# Patient Record
Sex: Male | Born: 1980
Health system: Southern US, Community
[De-identification: ages and names within clinical notes are randomized; demographics above are authoritative.]

## PROBLEM LIST (undated history)

## (undated) ENCOUNTER — Inpatient Hospital Stay: Payer: Self-pay | Admitting: Internal Medicine

## (undated) ENCOUNTER — Emergency Department (HOSPITAL_COMMUNITY): Admission: EM | Payer: BLUE CROSS/BLUE SHIELD | Source: Home / Self Care

## (undated) DIAGNOSIS — F329 Major depressive disorder, single episode, unspecified: Secondary | ICD-10-CM

## (undated) DIAGNOSIS — I878 Other specified disorders of veins: Secondary | ICD-10-CM

## (undated) DIAGNOSIS — I219 Acute myocardial infarction, unspecified: Secondary | ICD-10-CM

## (undated) DIAGNOSIS — I2699 Other pulmonary embolism without acute cor pulmonale: Secondary | ICD-10-CM

## (undated) DIAGNOSIS — I82409 Acute embolism and thrombosis of unspecified deep veins of unspecified lower extremity: Secondary | ICD-10-CM

## (undated) DIAGNOSIS — F32A Depression, unspecified: Secondary | ICD-10-CM

## (undated) DIAGNOSIS — F419 Anxiety disorder, unspecified: Secondary | ICD-10-CM

## (undated) DIAGNOSIS — K219 Gastro-esophageal reflux disease without esophagitis: Secondary | ICD-10-CM

## (undated) DIAGNOSIS — J189 Pneumonia, unspecified organism: Secondary | ICD-10-CM

## (undated) DIAGNOSIS — D6851 Activated protein C resistance: Secondary | ICD-10-CM

## (undated) HISTORY — PX: TONSILLECTOMY: SUR1361

## (undated) HISTORY — DX: Anxiety disorder, unspecified: F41.9

## (undated) HISTORY — DX: Depression, unspecified: F32.A

## (undated) HISTORY — DX: Major depressive disorder, single episode, unspecified: F32.9

## (undated) HISTORY — DX: Activated protein C resistance: D68.51

## (undated) HISTORY — DX: Other specified disorders of veins: I87.8

---

## 1998-11-02 ENCOUNTER — Encounter: Payer: Self-pay | Admitting: Family Medicine

## 1998-11-02 ENCOUNTER — Ambulatory Visit (HOSPITAL_COMMUNITY): Admission: RE | Admit: 1998-11-02 | Discharge: 1998-11-02 | Payer: Self-pay | Admitting: Family Medicine

## 2004-07-16 ENCOUNTER — Emergency Department (HOSPITAL_COMMUNITY): Admission: EM | Admit: 2004-07-16 | Discharge: 2004-07-16 | Payer: Self-pay | Admitting: Emergency Medicine

## 2004-07-17 ENCOUNTER — Ambulatory Visit (HOSPITAL_COMMUNITY): Admission: RE | Admit: 2004-07-17 | Discharge: 2004-07-17 | Payer: Self-pay | Admitting: Emergency Medicine

## 2004-07-17 ENCOUNTER — Emergency Department (HOSPITAL_COMMUNITY): Admission: EM | Admit: 2004-07-17 | Discharge: 2004-07-17 | Payer: Self-pay | Admitting: Emergency Medicine

## 2006-03-29 ENCOUNTER — Emergency Department (HOSPITAL_COMMUNITY): Admission: EM | Admit: 2006-03-29 | Discharge: 2006-03-29 | Payer: Self-pay | Admitting: Emergency Medicine

## 2006-03-29 ENCOUNTER — Encounter: Payer: Self-pay | Admitting: Vascular Surgery

## 2006-04-15 ENCOUNTER — Emergency Department (HOSPITAL_COMMUNITY): Admission: EM | Admit: 2006-04-15 | Discharge: 2006-04-15 | Payer: Self-pay | Admitting: Emergency Medicine

## 2006-05-05 ENCOUNTER — Inpatient Hospital Stay (HOSPITAL_COMMUNITY): Admission: EM | Admit: 2006-05-05 | Discharge: 2006-05-09 | Payer: Self-pay | Admitting: Emergency Medicine

## 2006-05-05 ENCOUNTER — Encounter: Payer: Self-pay | Admitting: Vascular Surgery

## 2007-06-22 ENCOUNTER — Emergency Department (HOSPITAL_COMMUNITY): Admission: EM | Admit: 2007-06-22 | Discharge: 2007-06-22 | Payer: Self-pay | Admitting: Emergency Medicine

## 2007-07-11 ENCOUNTER — Emergency Department (HOSPITAL_COMMUNITY): Admission: EM | Admit: 2007-07-11 | Discharge: 2007-07-12 | Payer: Self-pay | Admitting: Emergency Medicine

## 2007-11-06 ENCOUNTER — Ambulatory Visit: Payer: Self-pay | Admitting: Surgery

## 2007-11-06 ENCOUNTER — Encounter (INDEPENDENT_AMBULATORY_CARE_PROVIDER_SITE_OTHER): Payer: Self-pay | Admitting: Emergency Medicine

## 2007-11-06 ENCOUNTER — Emergency Department (HOSPITAL_COMMUNITY): Admission: EM | Admit: 2007-11-06 | Discharge: 2007-11-06 | Payer: Self-pay | Admitting: Emergency Medicine

## 2007-11-12 ENCOUNTER — Inpatient Hospital Stay (HOSPITAL_COMMUNITY): Admission: EM | Admit: 2007-11-12 | Discharge: 2007-11-16 | Payer: Self-pay | Admitting: Emergency Medicine

## 2007-11-13 ENCOUNTER — Encounter (INDEPENDENT_AMBULATORY_CARE_PROVIDER_SITE_OTHER): Payer: Self-pay | Admitting: Internal Medicine

## 2007-11-17 ENCOUNTER — Ambulatory Visit: Payer: Self-pay | Admitting: Internal Medicine

## 2007-11-17 ENCOUNTER — Inpatient Hospital Stay (HOSPITAL_COMMUNITY): Admission: EM | Admit: 2007-11-17 | Discharge: 2007-12-10 | Payer: Self-pay | Admitting: Emergency Medicine

## 2007-11-17 ENCOUNTER — Ambulatory Visit: Payer: Self-pay | Admitting: Pulmonary Disease

## 2007-11-17 HISTORY — PX: OTHER SURGICAL HISTORY: SHX169

## 2007-11-19 ENCOUNTER — Ambulatory Visit: Payer: Self-pay | Admitting: Cardiothoracic Surgery

## 2007-11-21 ENCOUNTER — Encounter: Payer: Self-pay | Admitting: Internal Medicine

## 2007-12-03 ENCOUNTER — Ambulatory Visit: Payer: Self-pay | Admitting: Oncology

## 2007-12-03 ENCOUNTER — Encounter: Payer: Self-pay | Admitting: Internal Medicine

## 2007-12-08 ENCOUNTER — Ambulatory Visit: Payer: Self-pay | Admitting: Oncology

## 2007-12-13 ENCOUNTER — Ambulatory Visit: Payer: Self-pay | Admitting: Internal Medicine

## 2007-12-13 DIAGNOSIS — I82409 Acute embolism and thrombosis of unspecified deep veins of unspecified lower extremity: Secondary | ICD-10-CM

## 2007-12-13 LAB — CONVERTED CEMR LAB: INR: 3.4

## 2007-12-14 ENCOUNTER — Encounter (INDEPENDENT_AMBULATORY_CARE_PROVIDER_SITE_OTHER): Payer: Self-pay | Admitting: Pharmacist

## 2007-12-18 ENCOUNTER — Ambulatory Visit: Payer: Self-pay | Admitting: Internal Medicine

## 2007-12-18 DIAGNOSIS — D6859 Other primary thrombophilia: Secondary | ICD-10-CM | POA: Insufficient documentation

## 2007-12-20 ENCOUNTER — Encounter (INDEPENDENT_AMBULATORY_CARE_PROVIDER_SITE_OTHER): Payer: Self-pay | Admitting: Internal Medicine

## 2007-12-22 ENCOUNTER — Emergency Department (HOSPITAL_COMMUNITY): Admission: EM | Admit: 2007-12-22 | Discharge: 2007-12-22 | Payer: Self-pay | Admitting: Emergency Medicine

## 2007-12-25 ENCOUNTER — Telehealth (INDEPENDENT_AMBULATORY_CARE_PROVIDER_SITE_OTHER): Payer: Self-pay | Admitting: Pharmacist

## 2007-12-26 ENCOUNTER — Ambulatory Visit: Payer: Self-pay | Admitting: *Deleted

## 2007-12-26 LAB — CONVERTED CEMR LAB

## 2008-01-02 ENCOUNTER — Ambulatory Visit: Payer: Self-pay | Admitting: Internal Medicine

## 2008-01-02 LAB — CONVERTED CEMR LAB: INR: 2

## 2008-01-14 ENCOUNTER — Ambulatory Visit: Payer: Self-pay | Admitting: Internal Medicine

## 2008-01-14 LAB — CONVERTED CEMR LAB

## 2008-01-28 ENCOUNTER — Ambulatory Visit: Payer: Self-pay | Admitting: Internal Medicine

## 2008-01-28 LAB — CONVERTED CEMR LAB: INR: 2.1

## 2008-02-11 ENCOUNTER — Ambulatory Visit: Payer: Self-pay | Admitting: Internal Medicine

## 2008-03-03 ENCOUNTER — Ambulatory Visit: Payer: Self-pay | Admitting: *Deleted

## 2008-03-03 LAB — CONVERTED CEMR LAB: INR: 1.5

## 2008-03-24 ENCOUNTER — Ambulatory Visit: Payer: Self-pay | Admitting: Internal Medicine

## 2008-03-24 LAB — CONVERTED CEMR LAB: INR: 1.8

## 2008-06-12 ENCOUNTER — Ambulatory Visit: Payer: Self-pay | Admitting: Internal Medicine

## 2008-06-12 LAB — CONVERTED CEMR LAB: INR: 1.3

## 2008-07-08 ENCOUNTER — Encounter: Payer: Self-pay | Admitting: Internal Medicine

## 2008-07-08 ENCOUNTER — Ambulatory Visit: Payer: Self-pay | Admitting: *Deleted

## 2008-08-04 ENCOUNTER — Ambulatory Visit: Payer: Self-pay | Admitting: Internal Medicine

## 2008-08-04 LAB — CONVERTED CEMR LAB

## 2008-10-13 ENCOUNTER — Ambulatory Visit: Payer: Self-pay | Admitting: Internal Medicine

## 2008-10-13 LAB — CONVERTED CEMR LAB: INR: 1.1

## 2008-10-28 ENCOUNTER — Ambulatory Visit: Payer: Self-pay | Admitting: Infectious Diseases

## 2008-10-28 DIAGNOSIS — I872 Venous insufficiency (chronic) (peripheral): Secondary | ICD-10-CM | POA: Insufficient documentation

## 2008-11-03 ENCOUNTER — Ambulatory Visit: Payer: Self-pay | Admitting: Internal Medicine

## 2008-11-03 ENCOUNTER — Ambulatory Visit: Payer: Self-pay | Admitting: Infectious Diseases

## 2008-11-03 LAB — CONVERTED CEMR LAB: INR: 2.3

## 2008-11-05 ENCOUNTER — Encounter (HOSPITAL_BASED_OUTPATIENT_CLINIC_OR_DEPARTMENT_OTHER): Admission: RE | Admit: 2008-11-05 | Discharge: 2009-02-03 | Payer: Self-pay | Admitting: Internal Medicine

## 2008-12-23 ENCOUNTER — Emergency Department (HOSPITAL_COMMUNITY): Admission: EM | Admit: 2008-12-23 | Discharge: 2008-12-23 | Payer: Self-pay | Admitting: Emergency Medicine

## 2008-12-24 ENCOUNTER — Encounter: Payer: Self-pay | Admitting: Internal Medicine

## 2008-12-31 ENCOUNTER — Ambulatory Visit: Payer: Self-pay | Admitting: Infectious Diseases

## 2008-12-31 DIAGNOSIS — L0390 Cellulitis, unspecified: Secondary | ICD-10-CM

## 2008-12-31 DIAGNOSIS — L0291 Cutaneous abscess, unspecified: Secondary | ICD-10-CM

## 2008-12-31 LAB — CONVERTED CEMR LAB
Hemoglobin: 15.3 g/dL (ref 13.0–17.0)
INR: 2.6 — ABNORMAL HIGH (ref 0.0–1.5)
Lymphocytes Relative: 20 % (ref 12–46)
Lymphs Abs: 1.5 10*3/uL (ref 0.7–4.0)
Monocytes Absolute: 0.7 10*3/uL (ref 0.1–1.0)
Monocytes Relative: 10 % (ref 3–12)
Neutro Abs: 4.8 10*3/uL (ref 1.7–7.7)
Neutrophils Relative %: 64 % (ref 43–77)
Prothrombin Time: 27.7 s — ABNORMAL HIGH (ref 11.6–15.2)
RBC: 4.71 M/uL (ref 4.22–5.81)
WBC: 7.5 10*3/uL (ref 4.0–10.5)

## 2009-05-13 IMAGING — CR DG CHEST 2V
2 series · 2 of 2 positions shown · non-contrast
Comparison: 11/26/2007

CLINICAL DATA: Right hemithorax hematoma.

CHEST - 2 VIEW

[w chest pa]
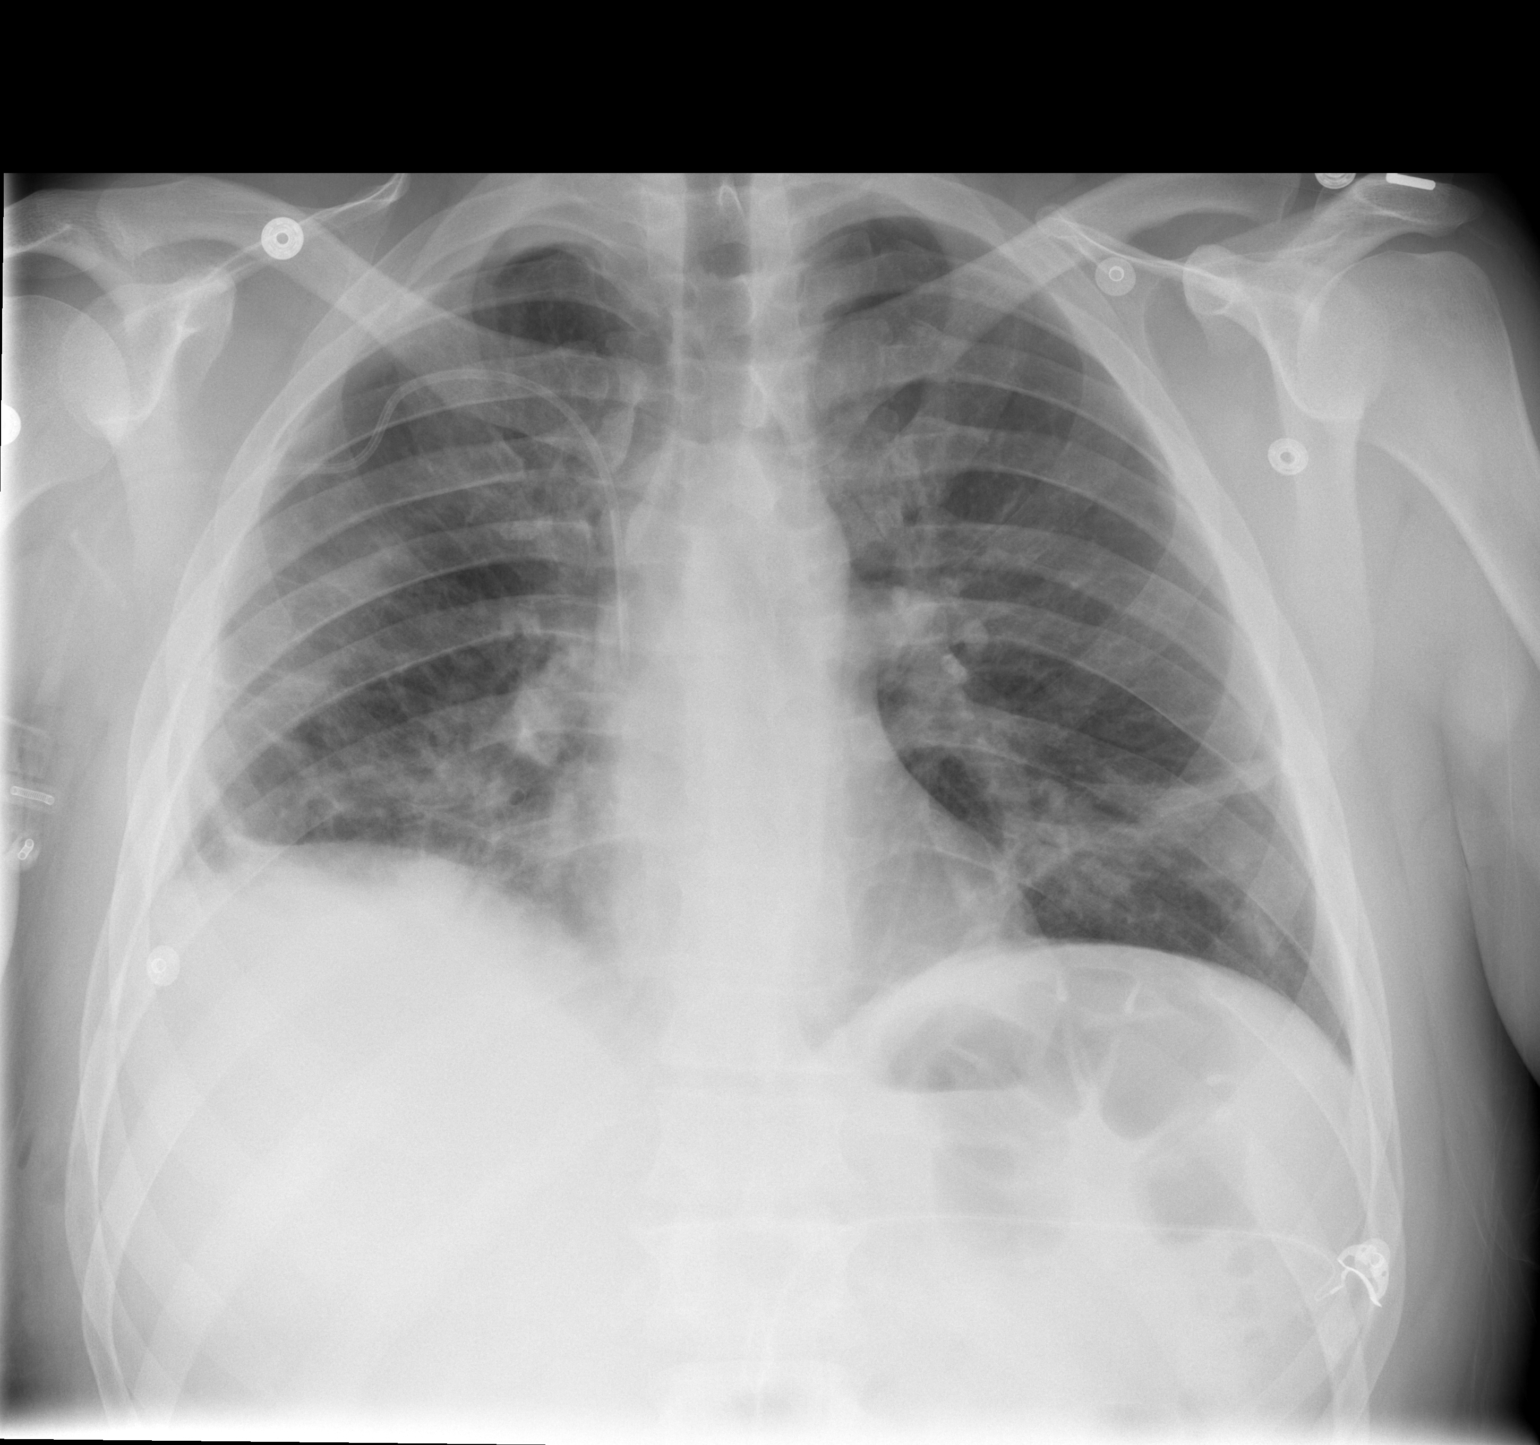

[w chest lat]
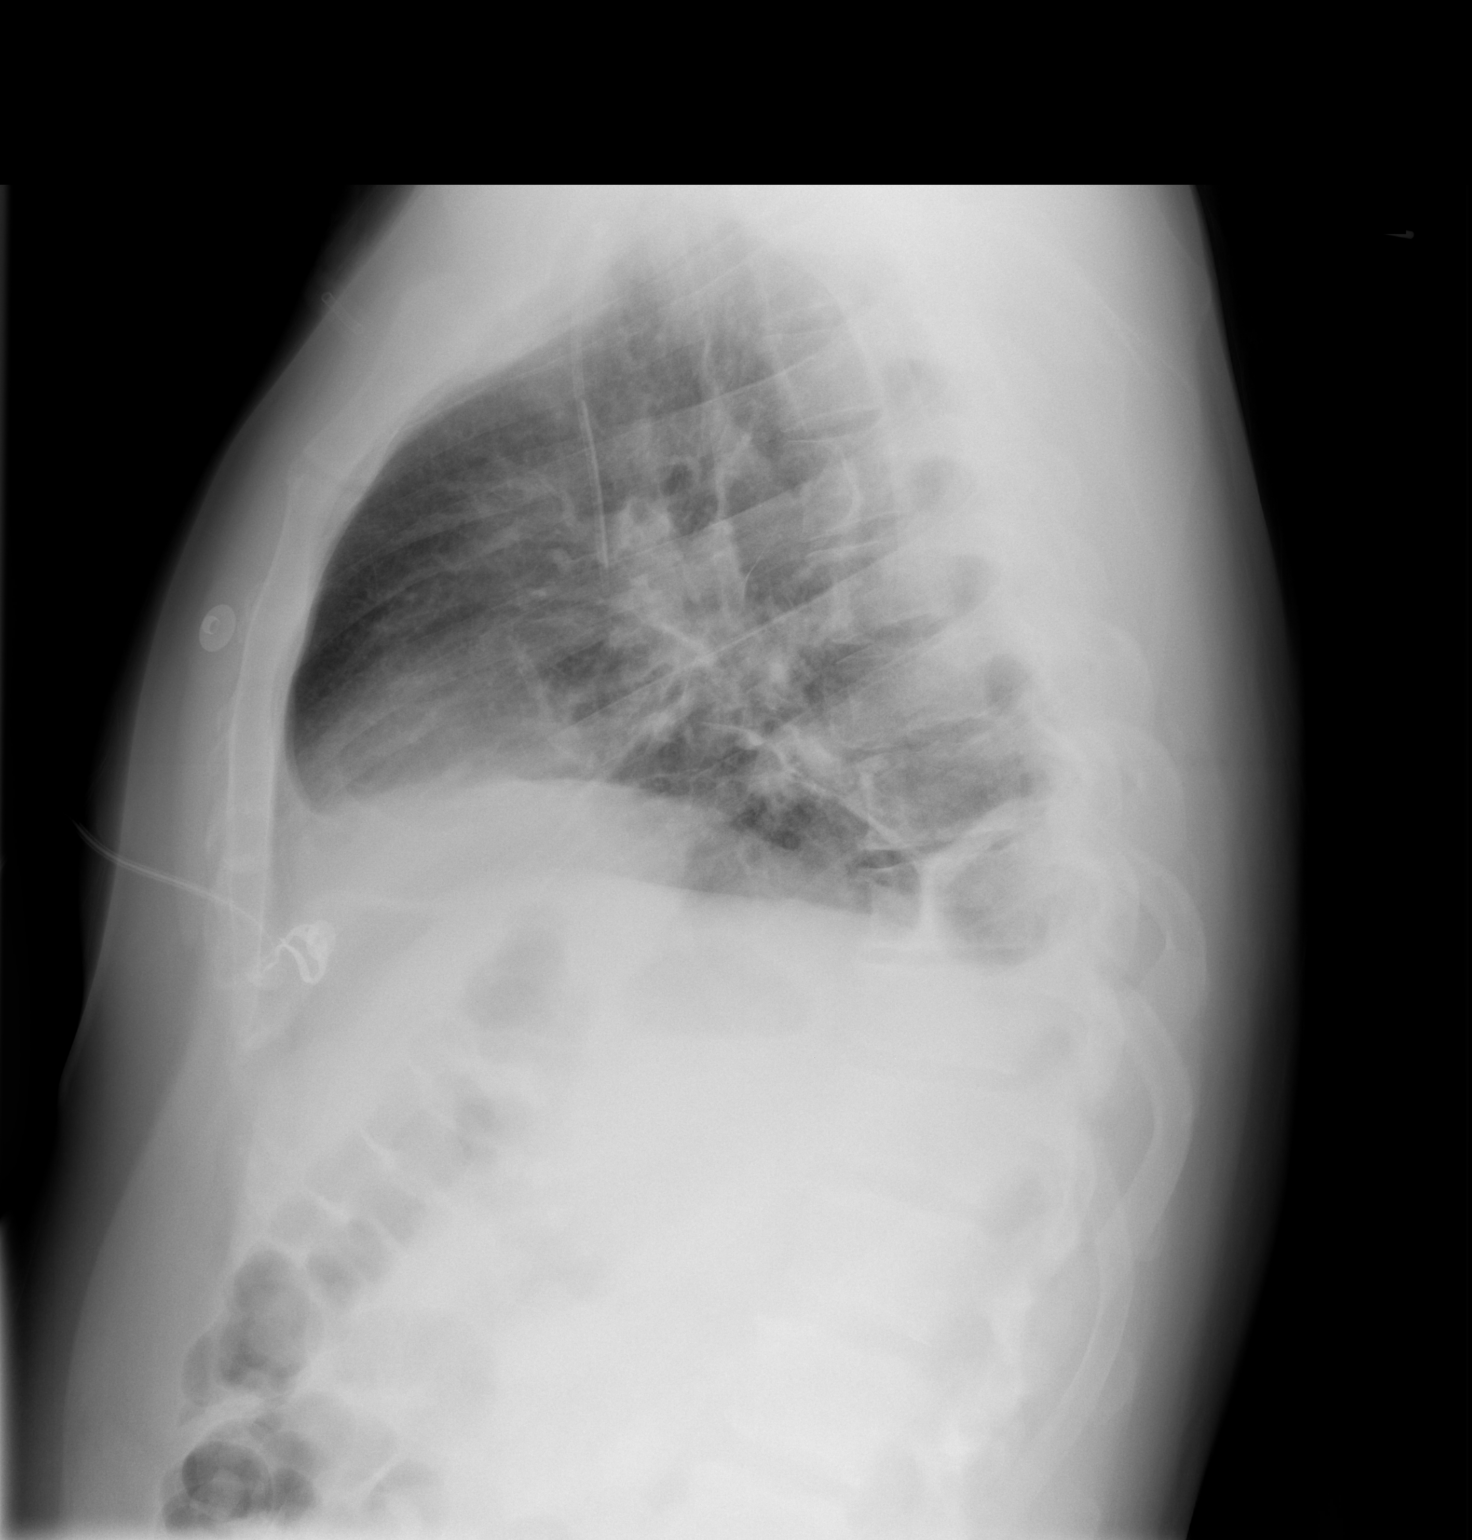

[2 of 2 positions shown; findings below may reference images not displayed]

FINDINGS: Two views of the chest were obtained.  The right chest
tube has been removed.  This is volume loss in the right lung base
with pleural and parenchymal densities.  The patient has a right
subclavian central venous catheter with the tip in the SVC.
Persistent linear density in the left lower lung suggestive for
atelectasis. There is no significant pneumothorax.
IMPRESSION: Removal of right chest tube without a large pneumothorax.

Stable pleural and parenchymal densities in the right lung.  There
is no significant pleural fluid.

Stable linear densities in both lungs probably representing
atelectasis.

## 2009-05-14 IMAGING — CR DG CHEST 2V
2 series · 2 of 2 positions shown · non-contrast
Comparison: PA and lateral chest 11/27/2007.

CLINICAL DATA: Pneumonia.  Status post thoracotomy.

CHEST - 2 VIEW

[w chest pa]
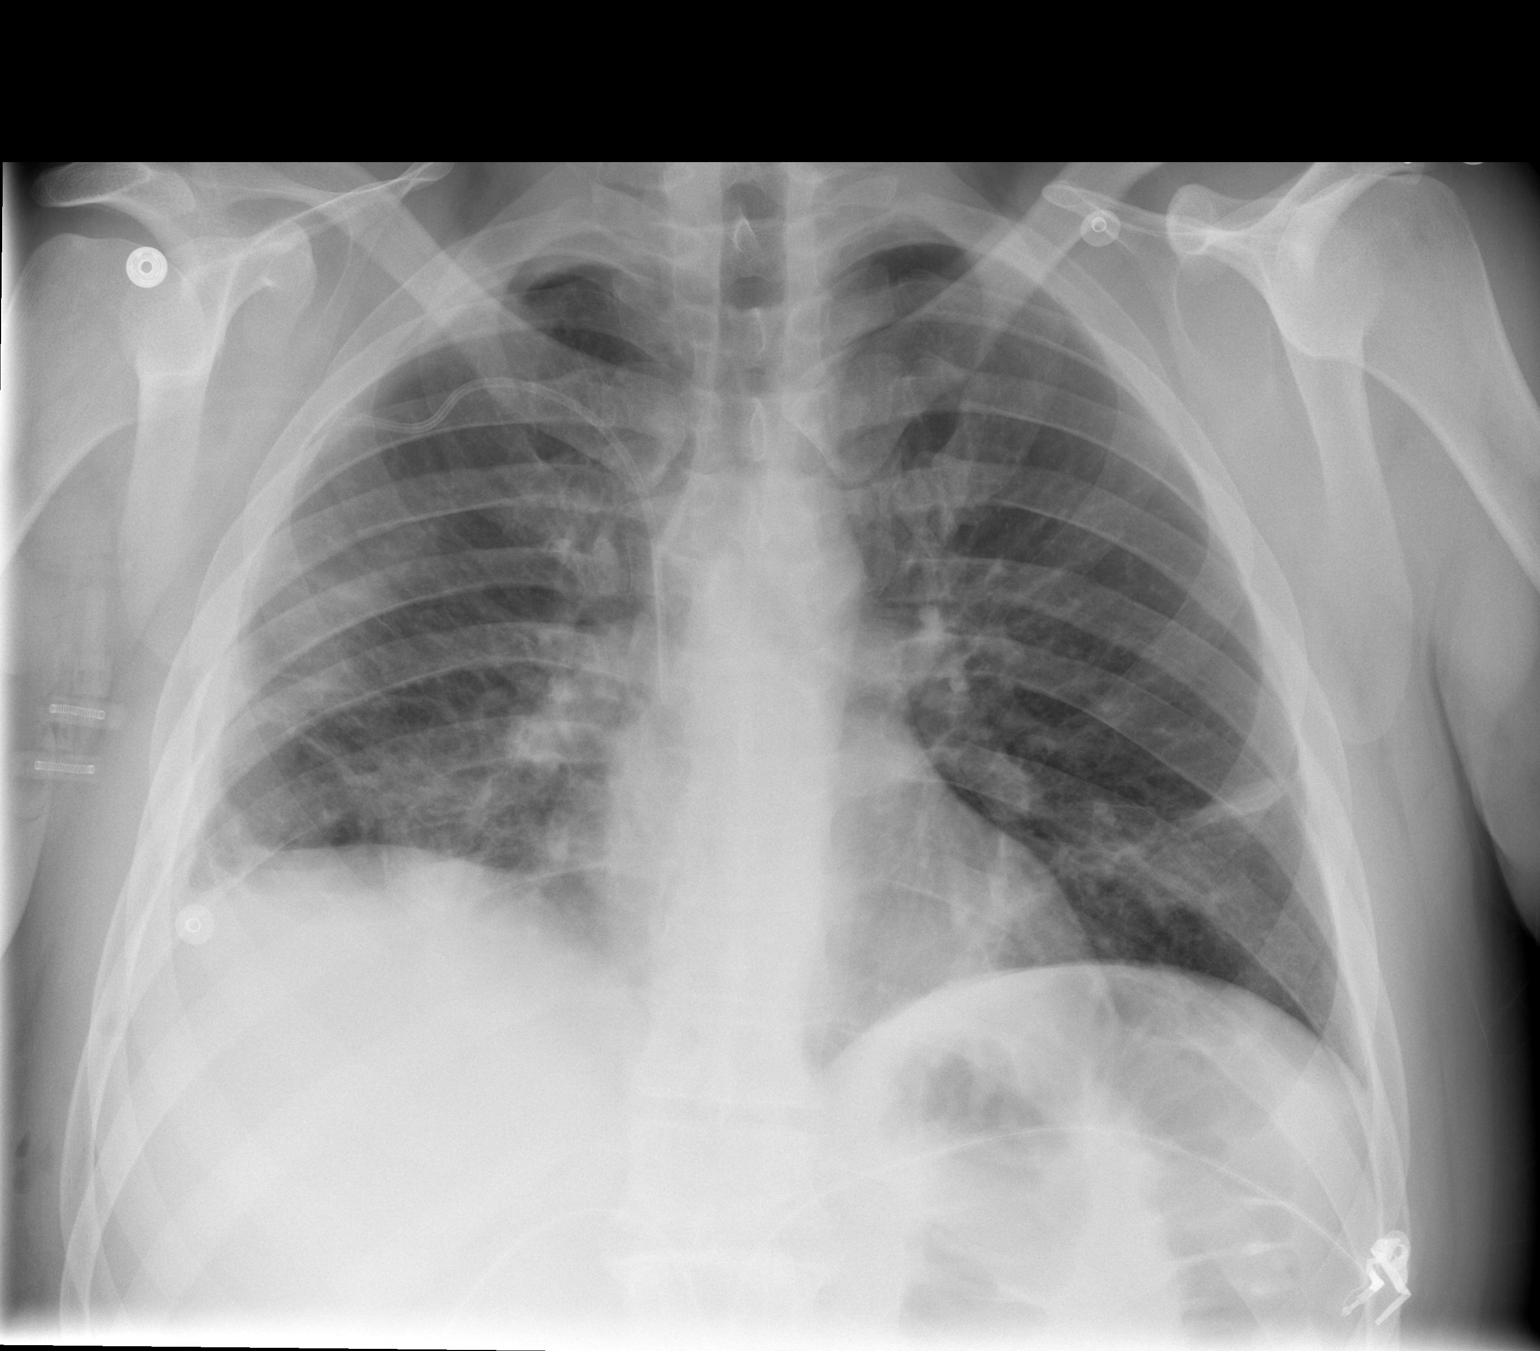

[w chest lat]
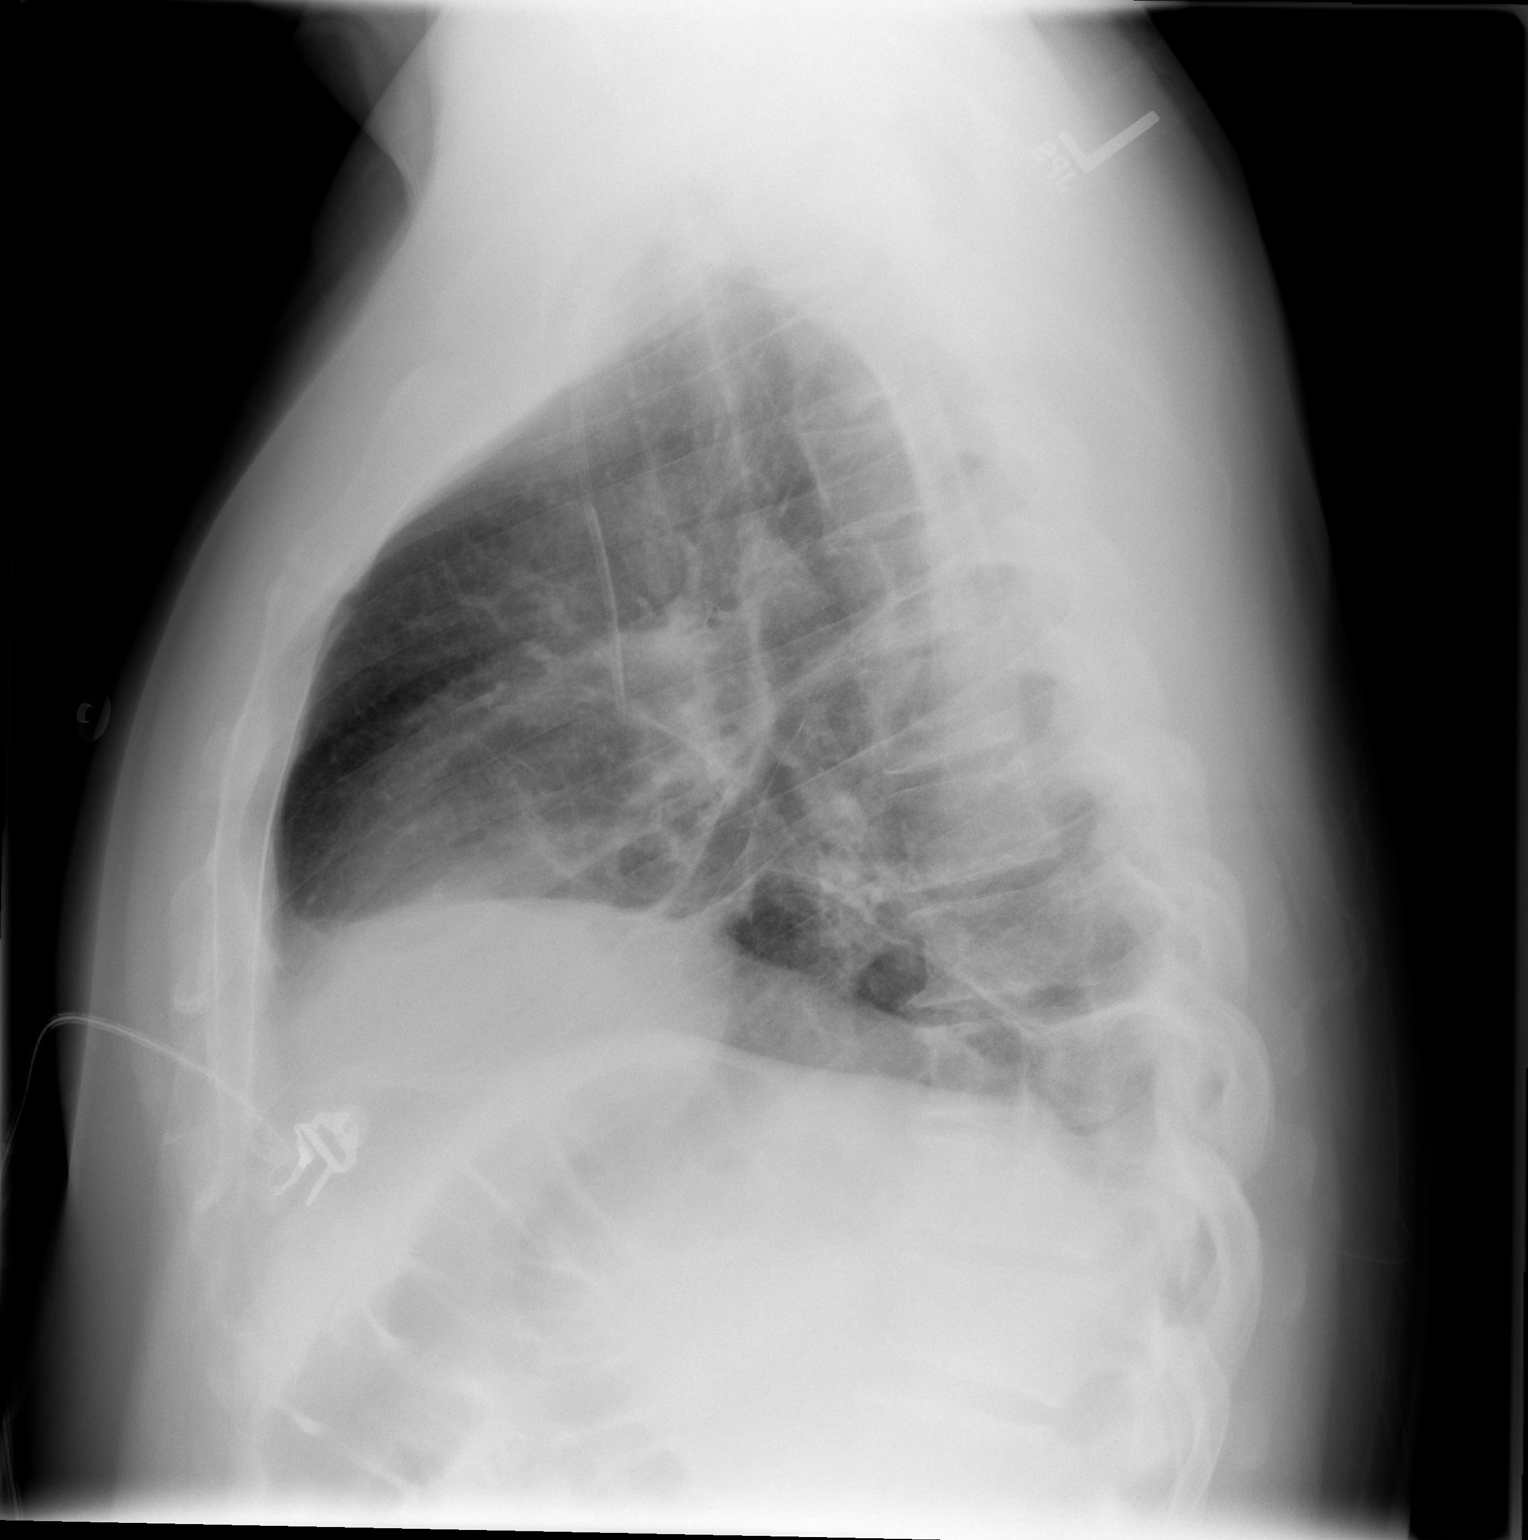

[2 of 2 positions shown; findings below may reference images not displayed]

FINDINGS: Small right pleural effusion or pleural thickening is
unchanged.  Bilateral atelectatic change, greater the right lung
again noted.  There is some volume loss the right chest.  Heart
size normal.  No pneumothorax.
IMPRESSION: No interval change.

## 2009-05-16 IMAGING — CR DG CHEST 2V
2 series · 2 of 2 positions shown · non-contrast
Comparison: Chest x-ray of 11/29/2007

CLINICAL DATA: Right-sided chest pain, history of bilateral
pulmonary embolism

CHEST - 2 VIEW

[w chest pa]
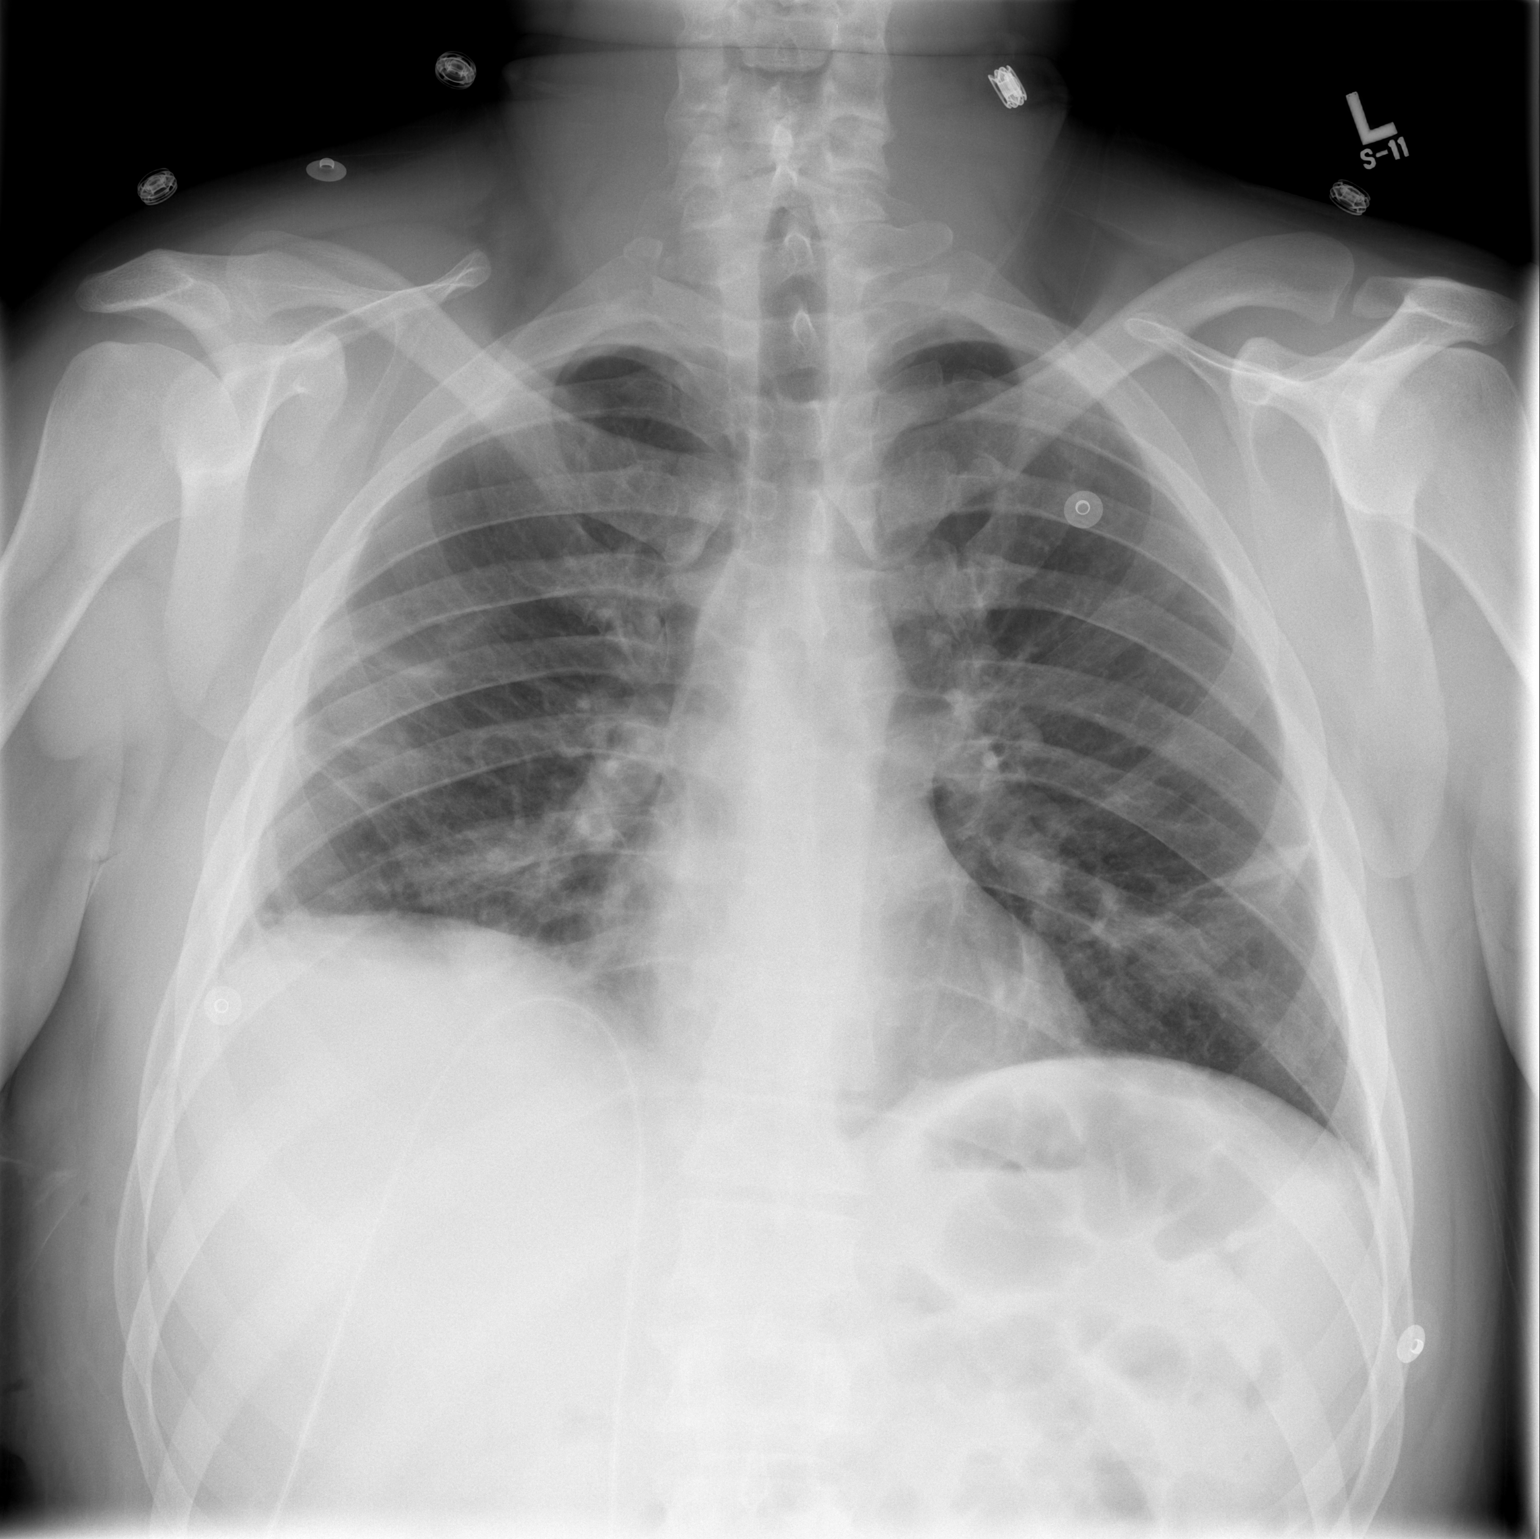

[w chest lat]
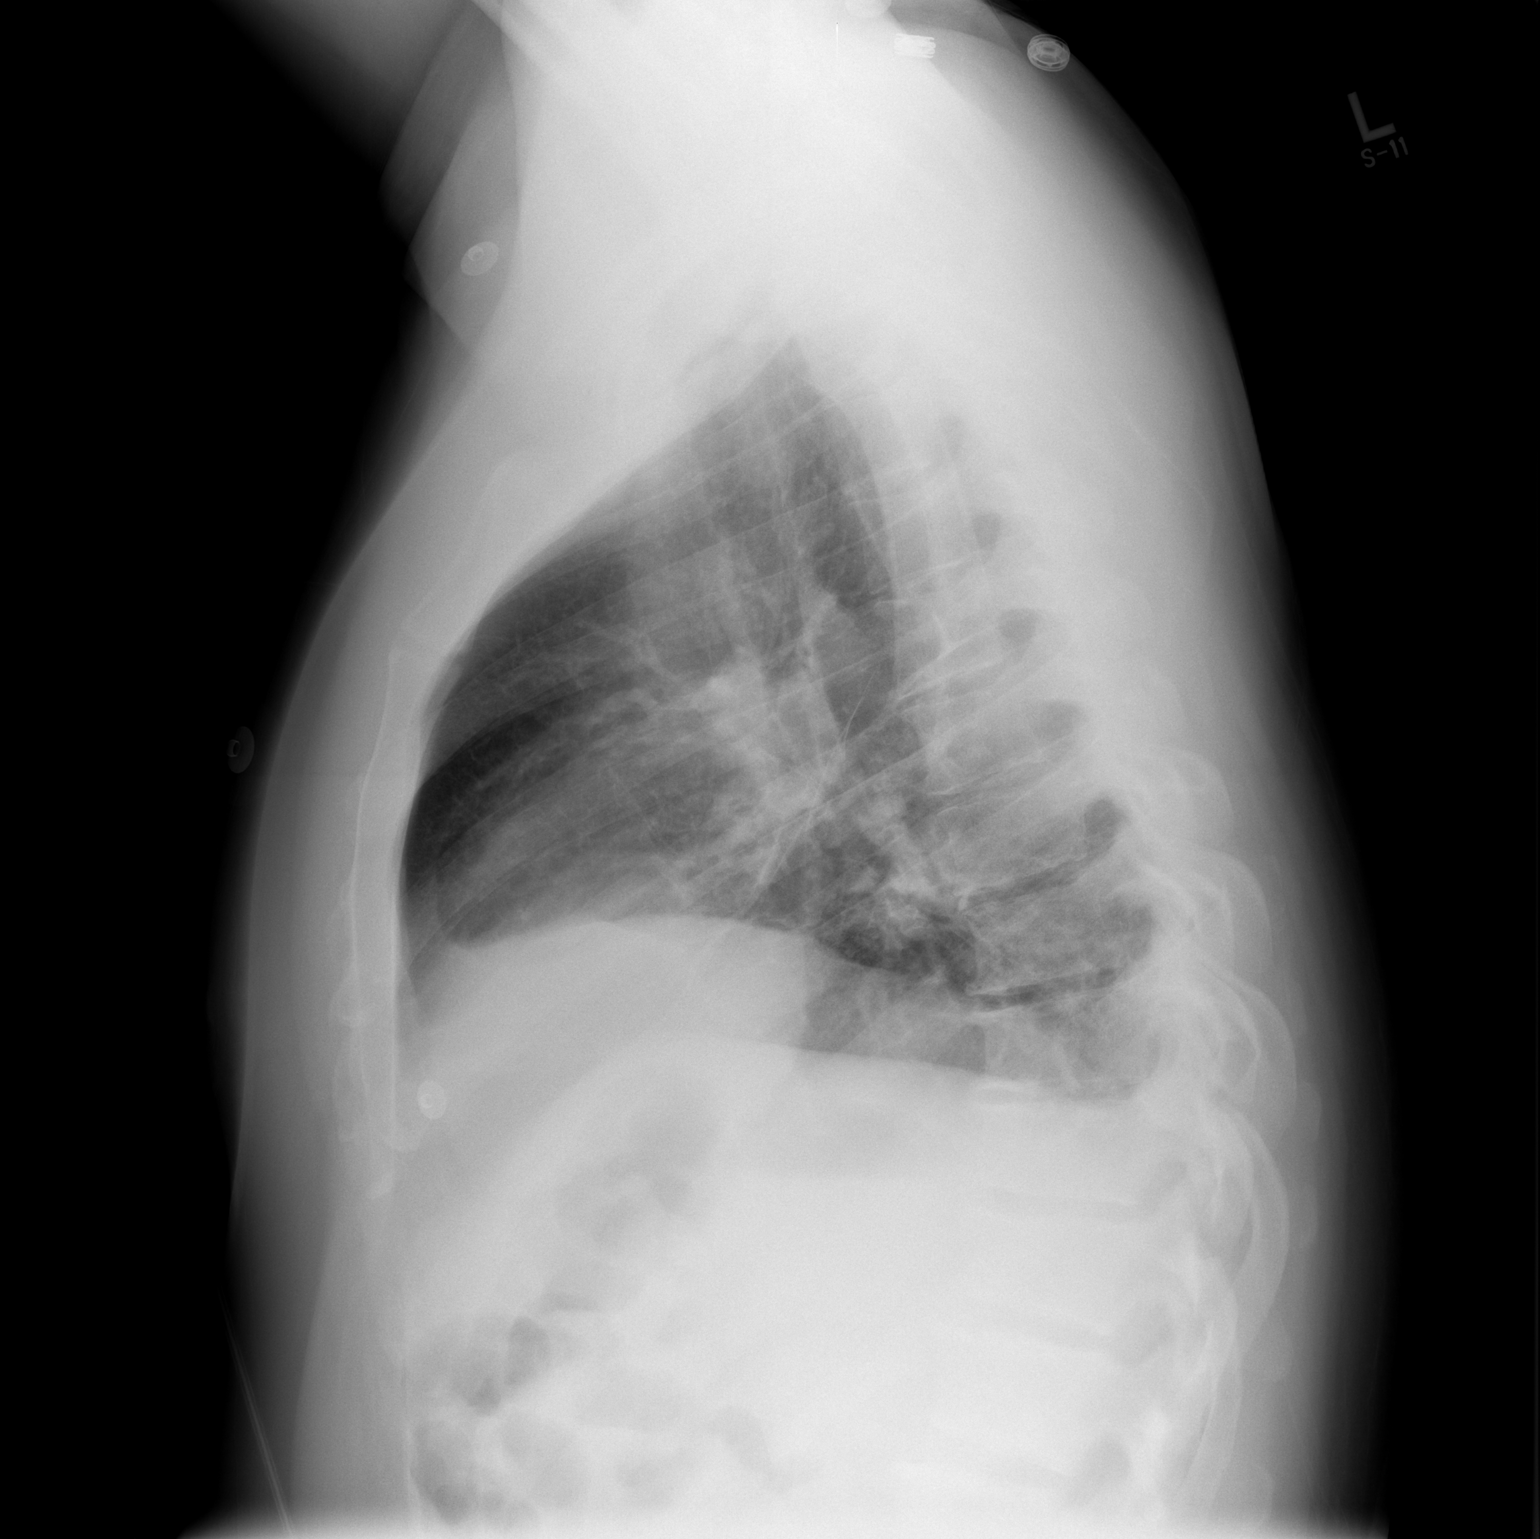

[2 of 2 positions shown; findings below may reference images not displayed]

FINDINGS: There is mild persistent linear atelectasis at the bases
with small effusions blunting the posterior costophrenic angles.
Aeration is suboptimal.  Heart size is stable.
IMPRESSION: Basilar linear atelectasis with tiny effusions.  Suboptimal
aeration.

## 2009-11-16 HISTORY — PX: OTHER SURGICAL HISTORY: SHX169

## 2009-11-17 ENCOUNTER — Inpatient Hospital Stay (HOSPITAL_COMMUNITY): Admission: EM | Admit: 2009-11-17 | Discharge: 2009-11-22 | Payer: Self-pay | Admitting: Emergency Medicine

## 2009-11-17 ENCOUNTER — Ambulatory Visit: Payer: Self-pay | Admitting: Thoracic Surgery (Cardiothoracic Vascular Surgery)

## 2009-11-17 DIAGNOSIS — J939 Pneumothorax, unspecified: Secondary | ICD-10-CM | POA: Insufficient documentation

## 2009-11-17 DIAGNOSIS — J930 Spontaneous tension pneumothorax: Secondary | ICD-10-CM

## 2009-11-24 ENCOUNTER — Ambulatory Visit: Payer: Self-pay | Admitting: Internal Medicine

## 2009-11-24 DIAGNOSIS — F341 Dysthymic disorder: Secondary | ICD-10-CM

## 2009-11-25 ENCOUNTER — Encounter: Payer: Self-pay | Admitting: Internal Medicine

## 2009-11-25 LAB — CONVERTED CEMR LAB: INR: 1.18 (ref <1.50)

## 2009-11-26 ENCOUNTER — Telehealth: Payer: Self-pay | Admitting: Licensed Clinical Social Worker

## 2009-11-27 ENCOUNTER — Ambulatory Visit: Payer: Self-pay | Admitting: Thoracic Surgery (Cardiothoracic Vascular Surgery)

## 2009-11-30 ENCOUNTER — Encounter: Payer: Self-pay | Admitting: Licensed Clinical Social Worker

## 2009-11-30 ENCOUNTER — Ambulatory Visit: Payer: Self-pay | Admitting: Internal Medicine

## 2009-11-30 LAB — CONVERTED CEMR LAB

## 2009-12-07 ENCOUNTER — Encounter
Admission: RE | Admit: 2009-12-07 | Discharge: 2009-12-07 | Payer: Self-pay | Admitting: Thoracic Surgery (Cardiothoracic Vascular Surgery)

## 2009-12-07 ENCOUNTER — Ambulatory Visit: Payer: Self-pay | Admitting: Thoracic Surgery (Cardiothoracic Vascular Surgery)

## 2010-03-24 ENCOUNTER — Emergency Department (HOSPITAL_COMMUNITY)
Admission: EM | Admit: 2010-03-24 | Discharge: 2010-03-25 | Payer: Self-pay | Source: Home / Self Care | Admitting: Emergency Medicine

## 2010-05-09 DIAGNOSIS — I82409 Acute embolism and thrombosis of unspecified deep veins of unspecified lower extremity: Secondary | ICD-10-CM

## 2010-05-09 DIAGNOSIS — D6859 Other primary thrombophilia: Secondary | ICD-10-CM

## 2010-05-09 DIAGNOSIS — Z7901 Long term (current) use of anticoagulants: Secondary | ICD-10-CM | POA: Insufficient documentation

## 2010-05-18 NOTE — Progress Notes (Signed)
Summary: SW Appmt/Aug 15th at 86 AM  Phone Note Other Incoming   Caller: Patient Summary of Call: Appmt at 11:00 AM with social work for assessment and planning.

## 2010-05-18 NOTE — Assessment & Plan Note (Signed)
Summary: per dr ho/ Coralee Pesa, see notes 8/9/pcp-boggala/hla  Anticoagulant Therapy Managed by: Barbera Setters. Janie Morning  PharmD CACP PCP: Blondell Reveal MD [facility] AttendingCoralee Pesa MD, Levada Schilling Indication 1: Deep vein thrombus Indication 2: Encounter for therapeutic drug monitoring  V58.83  Patient Assessment Reviewed by: Chancy Milroy PharmD  November 30, 2009 Medication review: verified warfarin dosage & schedule,verified previous prescription medications, verified doses & any changes, verified new medications, reviewed OTC medications, reviewed OTC health products-vitamins supplements etc Complications: none Dietary changes: none   Health status changes: none   Lifestyle changes: none   Recent/future hospitalizations: none   Recent/future procedures: none   Recent/future dental: none Patient Assessment Part 2:  Have you MISSED ANY DOSES or CHANGED TABLETS?  No missed Warfarin doses or changed tablets.  Have you had any BRUISING or BLEEDING ( nose or gum bleeds,blood in urine or stool)?  No reported bruising or bleeding in nose, gums, urine, stool.  Have you STARTED or STOPPED any MEDICATIONS, including OTC meds,herbals or supplements?  No other medications or herbal supplements were started or stopped.  Have you CHANGED your DIET, especially green vegetables,or ALCOHOL intake?  No changes in diet or alcohol intake.  Have you had any ILLNESSES or HOSPITALIZATIONS?  No reported illnesses or hospitalizations  Have you had any signs of CLOTTING?(chest discomfort,dizziness,shortness of breath,arms tingling,slurred speech,swelling or redness in leg)    No chest discomfort, dizziness, shortness of breath, tingling in arm, slurred speech, swelling, or redness in leg.     Treatment  Target INR: 2.0-3.0 INR: 1.7  Date: 11/30/2009 Regimen In:  165.0mg /week INR reflects regimen in: 1.7  New  Tablet strength: : 10mg  Regimen Out:     Sunday: 2 & 1/2 Tablet     Monday: 3 Tablet     Tuesday:  2 & 1/2 Tablet     Wednesday: 2 & 1/2 Tablet     Thursday: 3 Tablet      Friday: 2 & 1/2 Tablet     Saturday: 2 & 1/2 Tablet Total Weekly: 185mg/wk mg  Next INR Due: 12/14/2009   Return to anticoagulation clinic:  12/14/2009    Prescriptions: COUMADIN 10 MG TABS (WARFARIN SODIUM) Take 2 tablet by mouth once a day  #90 x 1   Entered by:   Jay Groce PharmD   Authorized by:   Wynne E Woodyear MD   Signed by:   Jay Groce PharmD on 11/30/2009   Method used:   Electronically to        [id number] (retail)       [address]       [place], [place]  [address]       Ph: [phone]       Fax: [phone]   RxID:   [id number]

## 2010-05-18 NOTE — Assessment & Plan Note (Signed)
Summary: Soc. Work  Soc. Work.  40 minutes. Referred by Dr. Anselm Jungling for depression, health issues and job loss.  Recent diagnosis DVT and hospitalization.  Sees Chancy Milroy for coumadin mgmt.     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   The patient has DVT and had a lung collapse several weeks ago.  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    The patient lives with his g.f. and 52 year old son.    His girlfriend does not work.   The patient is still smoking cigarettes, he says about 5 cigs per day and is not yet ready to quit.  I told him to call me about a month before he is ready to come up with a quit date.  He has the smoking cessation tip sheet to bring home with him.  The last office note also states that he uses marijuana.   The patient lost his father about 6 months ago and also his grandmother.  He has friends, an uncle and sister who live in the area .  He talks about family issues involving both his father and grandmother's estate which were very stressful.   I gave the patient information about [facility] for both counseling and psychiatry.  He did not want to make an appointment nor have me make him an appointment.  He said that he is much better since his last doctor's visit and just wants to get back to work.   I encouraged him to call me should he want to get involved in counseling or see psychiatry.  Right now he is not ready to do anything and is completely focused on getting back to work asap.

## 2010-05-18 NOTE — Assessment & Plan Note (Signed)
Summary: per dr Coralee Pesa, also pt,inr/pcp-boggala/hla   Vital Signs:  Patient profile:   30 year old male Height:      72 inches (182.88 cm) Weight:      231.7 pounds (105.32 kg) BMI:     31.54 O2 Sat:      96 % on Room air Temp:     97.1 degrees F (36.17 degrees C) oral Pulse rate:   77 / minute Resp:     20 per minute BP sitting:   122 / 72  (left arm)  Vitals Entered By: Cynda Familia Duncan Dull) (November 24, 2009 3:43 PM)  O2 Flow:  Room air CC: pt discharged from hospital 2 days ago, on chronic coumadin, but has not had since discharge, Depression Is Patient Diabetic? No Pain Assessment Patient in pain? yes     Location: chest Intensity: 6 Type: sharp Onset of pain  constant since hospitalization on aug 2nd Nutritional Status BMI of > 30 = obese  Have you ever been in a relationship where you felt threatened, hurt or afraid?No   Does patient need assistance? Functional Status Self care Ambulation Normal   Primary Care Provider:  Blondell Reveal MD  CC:  pt discharged from hospital 2 days ago, on chronic coumadin, but has not had since discharge, and Depression.  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  Patient was discharged from the hospital on 11/22/09.  There is an H&P for the admission; however, no discharge summary.  He presents today for hospital discharge follow up.  He  states that all of his symptoms have resolved, no SOB or chestpain.  He has been on and off his Coumadin due to multiple factors. He feels sad and depressed daily in the past 2 years, lost some weight but appetite and concentration are ok. He denies any SI/HI or hallucination.  He reports smoking 1-2  ppd x 19 years.  He also reported of using marijuana and drinking to cope with all the problems he is having right now.  His father passed away about 6 months ago and have lots of issues with his sister, his child's mother, friends, and work.  He admits that he needs some counseling.   Depression History:      The patient is having a depressed mood most of the day but denies diminished interest in his usual daily activities.        The patient denies that he wishes that he were dead and denies that he has thought about ending his life.        Comments:  pt states he has anxiety, feels down a lot.   Preventive Screening-Counseling & Management  Alcohol-Tobacco     Smoking Status: quit  Comments: Pt did admit to trying to smoke a cigarette today  Social History: Smoking Status:  quit  Review of Systems  The patient denies anorexia, fever, weight loss, weight gain, vision loss, decreased hearing, hoarseness, chest pain, syncope, dyspnea on exertion, peripheral edema, prolonged cough, headaches, hemoptysis, abdominal pain, melena, hematochezia, severe indigestion/heartburn, hematuria, incontinence, genital sores, muscle weakness, suspicious skin lesions, transient blindness, difficulty walking, depression, unusual weight change, abnormal bleeding, enlarged lymph nodes, angioedema, breast masses, and testicular masses.    Physical  Exam  General:  alert, well-developed, well-nourished, and well-hydrated.   Lungs:  normal respiratory effort, no intercostal retractions, no accessory muscle use, normal breath sounds, no crackles, and no wheezes.   Heart:  normal rate, regular rhythm, no murmur, no gallop, no rub, and no JVD.   Abdomen:  soft, non-tender, normal bowel sounds, no distention, no masses, and no guarding.   Extremities:  1+ left pedal non-pitting edema and 1+ right pedal non-pitting edema.  chronic venous stasis bilateral, warm to touch.  No ulcers.  Psych:  Oriented X3, memory  intact for recent and remote, depressed affect, slightly anxious, and judgment fair.     Impression & Recommendations:  Problem # 1:  PNEUMOTHORAX (ICD-512.8) Assessment New resolved.  Patient is asymptomatic, normal breath sounds on physical examination.  Patient will follow up with CVTS on 12/07/09.   Problem # 2:  PRIMARY HYPERCOAGULABLE STATE (ICD-289.81) Assessment: Deteriorated  Patient has been non-compliant with his Coumadin regimen.  Last INR on 11/22/09 was 1.87.   Will check INR today.   Will start Coumadin 20 & 25mg  alternating q daily until he sees Dr. Alexandria Lodge Follow up with Dr. Alexandria Lodge on 11/30/2009 Follow up with his PCP in 3-4 weeks  Orders: T-Protime (in-house) ([id number])  Problem # 3:  ANXIETY DEPRESSION (ICD-300.4) Assessment: New  Patient has depression as well as thymic disorder. Patient reports being depressed in the past 2 years because of overwhelming personal issues at home and work; he denies any SI/HI.  We discussed the option of antidepressants and its side effects; however, patient declined at this moment and would like to be referred to a counselor.  I will refer to Dorothe Pea, our social worker to help him find resources.  I also advised patient that he can always come back and talk to me if his depression get worst and if he feels suicidal or homicidal.    Orders: Social Work Referral (Social )  Complete Medication List: 1)  Coumadin 10 Mg Tabs (Warfarin sodium) .... Take 2 tablet by mouth once a day 2)  B-4 Med Compression Hose Mens Misc (Elastic bandages & supports) .... Use as directed 3)  Percocet 5-325 Mg Tabs (Oxycodone-acetaminophen) .... One by mouth q 6 hours as needed for pain  Patient Instructions: 1)  1. Start taking Coumadin 20mg  and 25mg  alternating daily until you see Dr. Alexandria Lodge 2)  2. Schedule to follow up with Dr. Alexandria Lodge next Monday 3)  3. Refer to Dorothe Pea- Social Worker to help set up with counseling 4)  4. Get INR level  today  Prevention & Chronic Care Immunizations   Influenza vaccine: Not documented    Tetanus booster: Not documented    Pneumococcal vaccine: Not documented  Other Screening   Smoking status: quit  (11/24/2009)      Resource handout printed.  Appended Document: per dr Coralee Pesa, also pt,inr/pcp-boggala/hla I saw Mr Pond with Dr Anselm Jungling and agree with her note with the following additions. As for his coumadin, last dose was 20 alternating with 25. His last INR with Dr Alexandria Lodge was nl but was quite a while back. Pt doesn't take the coumadin and hasn't been taking any for months bc it wasn't "top of his list" bc of all the pyschosocial issues he currently is having. He was on 25 in hospital and INR was 1.87 prior to D/C. Agree with restarting coumadin. This will be a lifelong med due to hypercoagulopathy.   As for depression, he was not interested in  SSRI. Interested in couseling and willing to start talking with Lupita Leash to help arrange F/U.

## 2010-07-02 LAB — CBC
HCT: 41.9 % (ref 39.0–52.0)
HCT: 42.4 % (ref 39.0–52.0)
HCT: 43.8 % (ref 39.0–52.0)
HCT: 46.1 % (ref 39.0–52.0)
Hemoglobin: 14.9 g/dL (ref 13.0–17.0)
Hemoglobin: 15.4 g/dL (ref 13.0–17.0)
Hemoglobin: 15.8 g/dL (ref 13.0–17.0)
MCH: 32 pg (ref 26.0–34.0)
MCH: 32.3 pg (ref 26.0–34.0)
MCH: 33.1 pg (ref 26.0–34.0)
MCHC: 34.6 g/dL (ref 30.0–36.0)
MCHC: 35.1 g/dL (ref 30.0–36.0)
MCHC: 35.6 g/dL (ref 30.0–36.0)
MCHC: 36.1 g/dL — ABNORMAL HIGH (ref 30.0–36.0)
MCV: 92.4 fL (ref 78.0–100.0)
MCV: 92.5 fL (ref 78.0–100.0)
Platelets: 186 10*3/uL (ref 150–400)
Platelets: 187 10*3/uL (ref 150–400)
Platelets: 188 10*3/uL (ref 150–400)
Platelets: 193 10*3/uL (ref 150–400)
RDW: 13.2 % (ref 11.5–15.5)
RDW: 13.2 % (ref 11.5–15.5)
RDW: 13.3 % (ref 11.5–15.5)
RDW: 13.4 % (ref 11.5–15.5)
RDW: 13.4 % (ref 11.5–15.5)
WBC: 8 10*3/uL (ref 4.0–10.5)

## 2010-07-02 LAB — DIFFERENTIAL
Eosinophils Relative: 5 % (ref 0–5)
Lymphocytes Relative: 14 % (ref 12–46)
Lymphs Abs: 1.5 10*3/uL (ref 0.7–4.0)
Monocytes Absolute: 1 10*3/uL (ref 0.1–1.0)

## 2010-07-02 LAB — BASIC METABOLIC PANEL
BUN: 14 mg/dL (ref 6–23)
CO2: 25 mEq/L (ref 19–32)
CO2: 27 mEq/L (ref 19–32)
Calcium: 9.7 mg/dL (ref 8.4–10.5)
GFR calc non Af Amer: >60 mL/min (ref >60)
GFR calc non Af Amer: >60 mL/min (ref >60)
Glucose, Bld: 103 mg/dL — ABNORMAL HIGH (ref 70–99)
Glucose, Bld: 99 mg/dL (ref 70–99)
Potassium: 3.4 mEq/L — ABNORMAL LOW (ref 3.5–5.1)
Sodium: 135 mEq/L (ref 135–145)
Sodium: 140 mEq/L (ref 135–145)

## 2010-07-02 LAB — HEPARIN LEVEL (UNFRACTIONATED)
Heparin Unfractionated: 0.22 IU/mL — ABNORMAL LOW (ref 0.30–0.70)
Heparin Unfractionated: 0.37 IU/mL (ref 0.30–0.70)
Heparin Unfractionated: 0.41 IU/mL (ref 0.30–0.70)
Heparin Unfractionated: 0.46 IU/mL (ref 0.30–0.70)

## 2010-07-02 LAB — PROTIME-INR
INR: 1.32 (ref 0.00–1.49)
Prothrombin Time: 13.2 seconds (ref 11.6–15.2)
Prothrombin Time: 13.9 seconds (ref 11.6–15.2)
Prothrombin Time: 16.6 seconds — ABNORMAL HIGH (ref 11.6–15.2)
Prothrombin Time: 19.6 seconds — ABNORMAL HIGH (ref 11.6–15.2)

## 2010-07-02 LAB — APTT: aPTT: 90 seconds — ABNORMAL HIGH (ref 24–37)

## 2010-07-09 ENCOUNTER — Other Ambulatory Visit: Payer: Self-pay | Admitting: Internal Medicine

## 2010-07-09 DIAGNOSIS — I82409 Acute embolism and thrombosis of unspecified deep veins of unspecified lower extremity: Secondary | ICD-10-CM

## 2010-07-12 ENCOUNTER — Emergency Department (HOSPITAL_COMMUNITY)
Admission: EM | Admit: 2010-07-12 | Discharge: 2010-07-13 | Disposition: A | Discharge status: Home / Self Care | Payer: BC Managed Care – PPO | Attending: Emergency Medicine | Admitting: Emergency Medicine

## 2010-07-12 ENCOUNTER — Encounter: Payer: Self-pay | Admitting: Internal Medicine

## 2010-07-12 DIAGNOSIS — R609 Edema, unspecified: Secondary | ICD-10-CM | POA: Insufficient documentation

## 2010-07-12 DIAGNOSIS — Z7901 Long term (current) use of anticoagulants: Secondary | ICD-10-CM | POA: Insufficient documentation

## 2010-07-12 DIAGNOSIS — L03119 Cellulitis of unspecified part of limb: Secondary | ICD-10-CM | POA: Insufficient documentation

## 2010-07-12 DIAGNOSIS — M79609 Pain in unspecified limb: Secondary | ICD-10-CM | POA: Insufficient documentation

## 2010-07-12 DIAGNOSIS — L02419 Cutaneous abscess of limb, unspecified: Secondary | ICD-10-CM | POA: Insufficient documentation

## 2010-07-12 DIAGNOSIS — Z86718 Personal history of other venous thrombosis and embolism: Secondary | ICD-10-CM | POA: Insufficient documentation

## 2010-07-12 DIAGNOSIS — M7989 Other specified soft tissue disorders: Secondary | ICD-10-CM | POA: Insufficient documentation

## 2010-07-12 LAB — PROTIME-INR: Prothrombin Time: 13.3 seconds (ref 11.6–15.2)

## 2010-07-12 NOTE — Progress Notes (Signed)
Patient seen in the emergency room Rt leg swelling and pain with subtherapeutic INR. Says he has been out of coumadin for 1 week. He will be discharged on lovenox from the ED and will return in am for LE doppler. Will try to schedule him in the clinic in 1-2 days for INR check and follow up on the skin redness on the rt leg which does not look like cellulitis at this time per ER staff but needs follow up

## 2010-07-13 ENCOUNTER — Telehealth: Payer: Self-pay | Admitting: Internal Medicine

## 2010-07-13 ENCOUNTER — Encounter: Payer: Self-pay | Admitting: Internal Medicine

## 2010-07-13 ENCOUNTER — Inpatient Hospital Stay (HOSPITAL_COMMUNITY)
Admission: AD | Admit: 2010-07-13 | Discharge: 2010-07-14 | DRG: 131 | Disposition: A | Discharge status: Home / Self Care | Payer: BC Managed Care – PPO | Source: Ambulatory Visit | Attending: Internal Medicine | Admitting: Internal Medicine

## 2010-07-13 ENCOUNTER — Ambulatory Visit (HOSPITAL_COMMUNITY)
Admission: RE | Admit: 2010-07-13 | Discharge: 2010-07-13 | Disposition: A | Discharge status: Home / Self Care | Payer: BC Managed Care – PPO | Source: Ambulatory Visit | Attending: Emergency Medicine | Admitting: Emergency Medicine

## 2010-07-13 ENCOUNTER — Other Ambulatory Visit: Payer: Self-pay | Admitting: Emergency Medicine

## 2010-07-13 DIAGNOSIS — I82409 Acute embolism and thrombosis of unspecified deep veins of unspecified lower extremity: Secondary | ICD-10-CM

## 2010-07-13 DIAGNOSIS — I824Z9 Acute embolism and thrombosis of unspecified deep veins of unspecified distal lower extremity: Principal | ICD-10-CM | POA: Diagnosis present

## 2010-07-13 DIAGNOSIS — M7989 Other specified soft tissue disorders: Secondary | ICD-10-CM

## 2010-07-13 LAB — CBC
Hemoglobin: 15.4 g/dL (ref 13.0–17.0)
MCH: 33.3 pg (ref 26.0–34.0)
MCV: 91.8 fL (ref 78.0–100.0)
Platelets: 198 10*3/uL (ref 150–400)
RBC: 4.62 MIL/uL (ref 4.22–5.81)
WBC: 11.1 10*3/uL — ABNORMAL HIGH (ref 4.0–10.5)

## 2010-07-13 LAB — BASIC METABOLIC PANEL
CO2: 30 mEq/L (ref 19–32)
Chloride: 100 mEq/L (ref 96–112)
Creatinine, Ser: 0.99 mg/dL (ref 0.4–1.5)
GFR calc Af Amer: >60 mL/min (ref >60)
Potassium: 4 mEq/L (ref 3.5–5.1)

## 2010-07-13 NOTE — Telephone Encounter (Signed)
Called from vascular lab: Patient had been seen in ER on 3-26 for clinical DVT.  Had been out of warfarin several days.  Restarted on warfarin, given bridge script for lovenox and app't for doppler today.  Doppler positive.  Patient refuses immediate admission due to family issues but agrees to come back for admission in 1-2 hours.  Clearly informed that risk of dangerous PE begins at once and the delay is at his own risk.  Says he has had a PE before and clearly understands the risk of delay.  Called the admitting resident Sherryll Burger) and he will follow-up.

## 2010-07-14 ENCOUNTER — Ambulatory Visit: Payer: Self-pay | Admitting: Ophthalmology

## 2010-07-14 DIAGNOSIS — I82409 Acute embolism and thrombosis of unspecified deep veins of unspecified lower extremity: Secondary | ICD-10-CM

## 2010-07-14 LAB — CBC
HCT: 46.3 % (ref 39.0–52.0)
MCH: 32.5 pg (ref 26.0–34.0)
MCV: 92.8 fL (ref 78.0–100.0)
Platelets: 197 10*3/uL (ref 150–400)
RBC: 4.99 MIL/uL (ref 4.22–5.81)
WBC: 9.5 10*3/uL (ref 4.0–10.5)

## 2010-07-14 LAB — BASIC METABOLIC PANEL
BUN: 11 mg/dL (ref 6–23)
Creatinine, Ser: 1.04 mg/dL (ref 0.4–1.5)
GFR calc non Af Amer: >60 mL/min (ref >60)
Glucose, Bld: 98 mg/dL (ref 70–99)

## 2010-07-16 ENCOUNTER — Ambulatory Visit (INDEPENDENT_AMBULATORY_CARE_PROVIDER_SITE_OTHER): Payer: BC Managed Care – PPO | Admitting: Pharmacist

## 2010-07-16 DIAGNOSIS — D6859 Other primary thrombophilia: Secondary | ICD-10-CM

## 2010-07-16 DIAGNOSIS — Z7901 Long term (current) use of anticoagulants: Secondary | ICD-10-CM

## 2010-07-16 DIAGNOSIS — I82409 Acute embolism and thrombosis of unspecified deep veins of unspecified lower extremity: Secondary | ICD-10-CM

## 2010-07-16 LAB — POCT INR: INR: 1.7

## 2010-07-16 NOTE — Progress Notes (Signed)
Anti-Coagulation Progress Note  [name] [name] is a 30 y.o. male who is currently on an anti-coagulation regimen.    RECENT RESULTS: Recent results are below, the most recent result is correlated with a dose of 20 mg. per DAY since discharge from hospital. Lab Results  Component Value Date   INR 1.7 07/16/2010   INR 1.08 07/14/2010   INR 0.99 07/12/2010    ANTI-COAG DOSE:    ANTICOAG SUMMARY: Anticoagulation Episode Summary              Current INR goal 2.0-3.0 Next INR check    INR from last check  Most recent INR 1.7! (07/16/2010)   Weekly max dose (mg)  Target end date Indefinite   Indications PRIMARY HYPERCOAGULABLE STATE, DVT, Long term current use of anticoagulant   INR check location [facility]    Send INR reminders to [facility]   Comments        Provider Role Specialty Phone number   Blanch Media  Internal Medicine [phone]        ANTICOAG TODAY: Anticoagulation Summary as of 07/16/2010              INR goal 2.0-3.0     Selected INR  Next INR check    Weekly max dose (mg)  Target end date Indefinite   Indications PRIMARY HYPERCOAGULABLE STATE, DVT, Long term current use of anticoagulant    Anticoagulation Episode Summary              INR check location [facility] Preferred lab    Send INR reminders to [facility]   Comments        Provider Role Specialty Phone number   Blanch Media  Internal Medicine [phone]        PATIENT INSTRUCTIONS: Patient Instructions  Patient instructed to take medications as defined in the Anti-coagulation Track section of this encounter.  Patient instructed to take today's dose.  Patient verbalized understanding of these instructions.        FOLLOW-UP Return in 3 days (on 07/19/2010) for Follow up INR.  [name], III Pharm.D., CACP

## 2010-07-16 NOTE — Patient Instructions (Signed)
Patient instructed to take medications as defined in the Anti-coagulation Track section of this encounter.  Patient instructed to take today's dose.  Patient verbalized understanding of these instructions.    

## 2010-07-19 ENCOUNTER — Ambulatory Visit (INDEPENDENT_AMBULATORY_CARE_PROVIDER_SITE_OTHER): Payer: BC Managed Care – PPO | Admitting: Pharmacist

## 2010-07-19 DIAGNOSIS — D6859 Other primary thrombophilia: Secondary | ICD-10-CM

## 2010-07-19 DIAGNOSIS — Z7901 Long term (current) use of anticoagulants: Secondary | ICD-10-CM

## 2010-07-19 DIAGNOSIS — I82409 Acute embolism and thrombosis of unspecified deep veins of unspecified lower extremity: Secondary | ICD-10-CM

## 2010-07-19 LAB — POCT INR: INR: 1.5

## 2010-07-19 NOTE — Patient Instructions (Signed)
Patient instructed to take medications as defined in the Anti-coagulation Track section of this encounter.  Patient instructed to take today's dose.  Patient verbalized understanding of these instructions.  Patient will continue Lovenox 1.5mg /kg sq q24h. He has 10 syringes remaining.

## 2010-07-19 NOTE — Progress Notes (Signed)
Anti-Coagulation Progress Note  CORBITT CLOKE is a 30 y.o. male who is currently on an anti-coagulation regimen.    RECENT RESULTS: Recent results are below, the most recent result is correlated with a dose of 145 mg. per week: Lab Results  Component Value Date   INR 1.5 07/19/2010   INR 1.7 07/16/2010   INR 1.08 07/14/2010    ANTI-COAG DOSE:   Latest dosing instructions   Total [name] Tue Wed Thu Fri Sat   165 20 mg 25 mg 25 mg 25 mg 20 mg 25 mg 25 mg    (10 mg2) (10 mg2.5) (10 mg2.5) (10 mg2.5) (10 mg2) (10 mg2.5) (10 mg2.5)         ANTICOAG SUMMARY: Anticoagulation Episode Summary              Current INR goal 2.0-3.0 Next INR check 07/26/2010   INR from last check 1.5! (07/19/2010)     Weekly max dose (mg)  Target end date Indefinite   Indications PRIMARY HYPERCOAGULABLE STATE, DVT, Long term current use of anticoagulant   INR check location [facility] Preferred lab    Send INR reminders to [facility]   Comments        Provider Role Specialty Phone number   Blanch Media  Internal Medicine [phone]        ANTICOAG TODAY: Anticoagulation Summary as of 07/19/2010              INR goal 2.0-3.0     Selected INR 1.5! (07/19/2010) Next INR check 07/26/2010   Weekly max dose (mg)  Target end date Indefinite   Indications PRIMARY HYPERCOAGULABLE STATE, DVT, Long term current use of anticoagulant    Anticoagulation Episode Summary              INR check location [facility] Preferred lab    Send INR reminders to [facility]   Comments        Provider Role Specialty Phone number   Blanch Media  Internal Medicine [phone]        PATIENT INSTRUCTIONS: Patient Instructions  Patient instructed to take medications as defined in the Anti-coagulation Track section of this encounter.  Patient instructed to take today's dose.  Patient verbalized understanding of these instructions.  Patient will continue Lovenox 1.5mg /kg sq q24h. He has 10  syringes remaining.      FOLLOW-UP Return in 7 days (on 07/26/2010) for Follow up INR.  [name], III Pharm.D., CACP

## 2010-07-20 NOTE — Miscellaneous (Signed)
Summary: Hospital Admission...  INTERNAL MEDICINE ADMISSION HISTORY AND PHYSICAL  Attending: Dr. Tilford Pillar PCP: Dr. Comer Locket 1st contact Dr Allena Katz [phone] 2nd contact Dr Arvilla Market  [phone]  Holidays or 5pm on weekdays:  1st contact [phone] 2nd contact [phone]  CC: R leg pain. HPI: [name] is a 30 yo man with PMH of DVT and PE 2/2 Factor 5 Leiden heterozygous mutation. He was alright until friday when he woke up in morning and started having R leg discomfort, which got worse over 24 hours and he went for his prescription refill for Coumadin but found out that he was out of it for 4 weeks, and for the whole time he was not taking it. The pain got better over the weekend and then got worse again on Monday when she went to [facility] where he was Dxed with possible DVT and was sent home with appointment with [facility] for INR check and LE duplex study and also pain meds. In [facility] and got his INR checked- which was 0.99 and also had LE Dopplers which showed R leg DVT. So was admitted for further heparin management. He denies any chest pain, palpitation, SOB, dizziness, abdominal pain.  ALLERGIES: NKDA  PAST MEDICAL HISTORY: Hypercoaguable state     Factor V leiden mutation Hx of Bil. DVT, PE , s/p IVC filter placement- 11/2007 Chronic coumadin therapy, requiring high doses to keep INR 2-3. Left Spontaneous Pneumothorax- 11/2009    MEDICATIONS:  COUMADIN 10 MG TABS (WARFARIN SODIUM) Take 2 tablet by mouth once a day B-4 MED COMPRESSION HOSE MENS  MISC (ELASTIC BANDAGES & SUPPORTS) use as directed PERCOCET 5-325 MG TABS (OXYCODONE-ACETAMINOPHEN) one by mouth q 6 hours as needed for pain    SOCIAL HISTORY: Recently got a new job and is working very hard in there. Lives with his girl friend, has one son. Not married.  smokes 1/2 to 1 ppd for about 20 years now.   FAMILY HISTORY Does not know about his parents   ROS: As per HPI, all other systems reviewed and  negative  VITALS: T: 98.2  P: 80  BP: 134/70  R: 18  O2SAT:97  ON: ra  PHYSICAL EXAM: Gen: Patient is in NAD, Pleasant. Eyes: PERRL, EOMI, No signs of anemia or jaundince. ENT: MMM, OP clear, No erythema, thrush or exudates. Neck: Supple, No carotid Bruits, No JVD, No thyromegaly Resp: CTA- Bilaterally, No W/C/R. CVS: S1S2 RRR, No M/R/G GI: Abdomen is soft. ND, NT, NG, NR, BS+. No organomegaly. Ext: R leg swelling, redness medially and severe tenderness to touch and palpation. Also calf tenderness. Has discoloration of legs B/L. Has ulcer above the medial malleolus on R leg. GU: No CVA tenderness. Skin: Skin discoloration B/L legs. R chest and back has scars from his previous chest tube placements in 2009. Lymph: No palpable lymphadenopathy. MS: Moving all 4 extremities. Neuro: A&O X3, CN II - XII are grossly intact. Motor strength is 5/5 in the all 4 extremities, Sensations intact to light touch, Gait normal, Cerebellar signs negative. Psych: Appropriate  LABS: Protime ( Prothrombin Time)              13.3              11.6-15.2        seconds  INR  0.99              0.00-1.49  Bilateral Lower Extremity Venous Duplex Evaluation: - Findings consistent with acute deep vein thrombosis and     superficial vein thrombosisinvolving the right lower extremity.   - Findings consistent with acute superficial vein thrombosis     involving the left lower extremity.   - No evidence of Baker's cyst on the right or left. R popliteal, R greater saphenous and L greater Ssaphenous Acute DVT.  ASSESSMENT AND PLAN: 1) R LE pain and redness: Considering his presentation and his PMH and duplex result, his pain is 2/2 Acute DVT of R popliteal vein.  He has a Hx of Factor V leiden deficiency and was not on coumadin for 4 weeks. - Will admit to regular bed for Heparin therapy for his acute DVT. - Will check PT/INR in am. - Will do CBC and BMET to r/o cellulitis. -  Will not do CTA chest as he has a greenfield IVC filter and also denies any symptoms for PE. - Will give Tylenol 650 mg q4 hr as needed for pain. - Will send him home tomorrow am, most likely, with lovenox shots as home as he has done it in past and is comfortable doing by himself.  2) Tobacco abuse: Will consult CSW for smoking cessation counselling.

## 2010-07-23 ENCOUNTER — Emergency Department (HOSPITAL_COMMUNITY)
Admission: EM | Admit: 2010-07-23 | Discharge: 2010-07-23 | Disposition: A | Discharge status: Home / Self Care | Payer: BC Managed Care – PPO | Attending: Emergency Medicine | Admitting: Emergency Medicine

## 2010-07-23 DIAGNOSIS — L0201 Cutaneous abscess of face: Secondary | ICD-10-CM | POA: Insufficient documentation

## 2010-07-23 DIAGNOSIS — L03211 Cellulitis of face: Secondary | ICD-10-CM | POA: Insufficient documentation

## 2010-07-23 DIAGNOSIS — Z86718 Personal history of other venous thrombosis and embolism: Secondary | ICD-10-CM | POA: Insufficient documentation

## 2010-07-23 DIAGNOSIS — R220 Localized swelling, mass and lump, head: Secondary | ICD-10-CM | POA: Insufficient documentation

## 2010-07-23 DIAGNOSIS — R221 Localized swelling, mass and lump, neck: Secondary | ICD-10-CM | POA: Insufficient documentation

## 2010-07-23 DIAGNOSIS — Z7901 Long term (current) use of anticoagulants: Secondary | ICD-10-CM | POA: Insufficient documentation

## 2010-07-23 LAB — BASIC METABOLIC PANEL
BUN: 9 mg/dL (ref 6–23)
CO2: 27 mEq/L (ref 19–32)
Chloride: 100 mEq/L (ref 96–112)
Creatinine, Ser: 0.8 mg/dL (ref 0.4–1.5)
Potassium: 3.9 mEq/L (ref 3.5–5.1)

## 2010-07-23 LAB — PROTIME-INR: INR: 1.9 — ABNORMAL HIGH (ref 0.00–1.49)

## 2010-07-23 LAB — DIFFERENTIAL
Basophils Relative: 1 % (ref 0–1)
Eosinophils Absolute: 0.4 10*3/uL (ref 0.0–0.7)
Eosinophils Relative: 4 % (ref 0–5)
Lymphs Abs: 2 10*3/uL (ref 0.7–4.0)
Monocytes Relative: 9 % (ref 3–12)
Neutrophils Relative %: 66 % (ref 43–77)

## 2010-07-23 LAB — CBC
HCT: 44.9 % (ref 39.0–52.0)
MCHC: 34.8 g/dL (ref 30.0–36.0)
MCV: 94.1 fL (ref 78.0–100.0)
Platelets: 251 10*3/uL (ref 150–400)
RBC: 4.78 MIL/uL (ref 4.22–5.81)

## 2010-07-26 ENCOUNTER — Ambulatory Visit (INDEPENDENT_AMBULATORY_CARE_PROVIDER_SITE_OTHER): Payer: BC Managed Care – PPO | Admitting: Pharmacist

## 2010-07-26 ENCOUNTER — Other Ambulatory Visit: Payer: Self-pay | Admitting: Pharmacist

## 2010-07-26 DIAGNOSIS — D6859 Other primary thrombophilia: Secondary | ICD-10-CM

## 2010-07-26 DIAGNOSIS — Z7901 Long term (current) use of anticoagulants: Secondary | ICD-10-CM

## 2010-07-26 DIAGNOSIS — I82409 Acute embolism and thrombosis of unspecified deep veins of unspecified lower extremity: Secondary | ICD-10-CM

## 2010-07-26 LAB — POCT INR: INR: 1.5

## 2010-07-26 MED ORDER — WARFARIN SODIUM 10 MG PO TABS: ORAL_TABLET | ORAL | Status: DC

## 2010-07-26 NOTE — Progress Notes (Signed)
Anti-Coagulation Progress Note  [name] [name] is a 30 y.o. male who is currently on an anti-coagulation regimen.    RECENT RESULTS: Recent results are below, the most recent result is correlated with a dose of 165 mg. per week:  He will continue Lovenox 1.5mg /kg sq q24h. Lab Results  Component Value Date   INR 1.5 07/26/2010   INR 1.5 07/19/2010   INR 1.7 07/16/2010    ANTI-COAG DOSE:   Latest dosing instructions   Total [name] Tue Wed Thu Fri Sat   165 20 mg 25 mg 25 mg 25 mg 20 mg 25 mg 25 mg    (10 mg2) (10 mg2.5) (10 mg2.5) (10 mg2.5) (10 mg2) (10 mg2.5) (10 mg2.5)         ANTICOAG SUMMARY: Anticoagulation Episode Summary              Current INR goal 2.0-3.0 Next INR check 08/02/2010   INR from last check 1.5! (07/26/2010)     Weekly max dose (mg)  Target end date Indefinite   Indications PRIMARY HYPERCOAGULABLE STATE, DVT, Long term current use of anticoagulant   [facility] check [facility]    Send INR reminders to [facility]   Comments        Provider Role Specialty Phone number   Blanch Media  Internal Medicine [phone]        ANTICOAG TODAY: Anticoagulation Summary as of 07/26/2010              INR goal 2.0-3.0     Selected INR 1.5! (07/26/2010) Next INR check 08/02/2010   Weekly max dose (mg)  Target end date Indefinite   Indications PRIMARY HYPERCOAGULABLE STATE, DVT, Long term current use of anticoagulant    Anticoagulation Episode Summary              IN[facility] check location [facility] Preferred lab    Send INR reminders to [facility]   Comments        Provider Role Specialty Phone number   Blanch Media  Internal Medicine [phone]        PATIENT INSTRUCTIONS: Patient Instructions  Patient instructed to take medications as defined in the Anti-coagulation Track section of this encounter.  Patient instructed to take today's dose.  Patient verbalized understanding of these instructions.          FOLLOW-UP Return in about 7 days (around 08/02/2010) for Follow up INR.  [name], III Pharm.D., CACP

## 2010-07-26 NOTE — Patient Instructions (Signed)
Patient instructed to take medications as defined in the Anti-coagulation Track section of this encounter.  Patient instructed to take today's dose.  Patient verbalized understanding of these instructions.    

## 2010-07-28 NOTE — Discharge Summary (Signed)
I willNAME:  Calandra, Monique               ACCOUNT NO.:  [id number]  MEDICAL RECORD NO.:  [id number]           PATIENT TYPE:  I  LOCATION:  5504                         FACILITY:  [facility]  PHYSICIAN:  Tilford Pillar, MD     DATE OF BIRTH:  Feb 09, 1981  DATE OF ADMISSION:  07/13/2010 DATE OF DISCHARGE:  07/14/2010                              DISCHARGE SUMMARY   DISCHARGE DIAGNOSES: 1. Acute deep venous thrombosis, right popliteal vein and superficial     venous thrombosis, and right and left great saphenous vein. 2. Factor V Leiden mutation, heterozygous. 3. History of bilateral deep venous thrombosis, pulmonary embolism,     status post inferior vena cava filter placement in August 2009. 4. Chronic Coumadin therapy, requiring high doses to keep INR 2 to 3. 5. Left spontaneous pneumothorax August 2011. 6. History of nonhealing wound/ ulcers of bilateral legs, venous     insufficiency/venous stasis.  DISCHARGE MEDICATIONS: 1. Lovenox 100 mg subcutaneously on the night of discharge and 150 mg     subcutaneously daily for three more days. 2. Warfarin 25 mg on Thursday and then 25 mg on every Monday,     Wednesday, Friday and 20 mg on every Tuesday, Thursday and     Saturday. 3. Doses of Coumadin and Lovenox might be change by Dr. Alexandria Lodge during     the followup appointment on Friday, July 16, 2010. 4. Tylenol 650 mg tablets one tablet every 6 hours as needed for pain.  DISPOSITION AND FOLLOWUP:  Mr. Scheffler is to be discharged and is to be followed up with Dr. Alexandria Lodge at [facility] for his INR check and further dosing of his heparin and Coumadin depending on July 16, 2010 at 10 a.m.  PROCEDURES PERFORMED:  Bilateral lower extremity venous duplex evaluation summary, findings consistent with acute DVT and superficial venous thrombosis involving the right lower extremity, also acute SVT involving the left lower extremity with no evidence of Baker cyst on the right  or left.  DVT in right popliteal vein and SVT in right and left greater saphenous veins.  CONSULTATIONS:  None.  CHIEF COMPLAINT:  Right leg pain.  HISTORY OF PRESENT ILLNESS:  Mr. Kantner is a 30 year old man with past medical history of DVT and PE secondary to Factor V Leiden heterozygous mutation.  He was in his normal state of health until Friday when he woke up in morning and started having right leg discomfort, which got worse over the 24 hours and went to fill his Coumadin, but found out that he was out of it 4 weeks and for the whole time he was not taking it.  Pain got better with a week and then got worse again on Monday when he went to [facility] where he was diagnosed with possible DVT and was sent home with appointment with [facility] for INR check and the lower extremity duplex study and also pain meds.  In Outpatient Clinic, he got his INR checked, which was 0.99 and also lower extremity problems which showed right leg DVT, so was admitted for further heparin management.  He denies anychest pain, palpitation, shortness of breath, dizziness, abdominal pain, nausea, or vomiting.  ALLERGIES:  No known drug allergies.  PHYSICAL EXAMINATION:  VITAL SIGNS:  On admission, temperature 98.2, pulse 80, blood pressure 134/70, respiratory rate 18, O2 sat 97% on room air. GENERAL:  The patient in no acute distress, pleasant. EYES:  PERRLA, EOMI.  No signs of anemia or jaundice. ENT:  MMM.  OP clear.  No edema, thrush, or exudates. NECK:  Supple.  No carotid bruits.  No JVD.  No thyromegaly. RESPIRATORY:  CTA bilaterally.  No wheezes, crackles or rales. CVS:  S1 and S2.  RRR.  No MRG. GI:  Soft.  Nondistended, nontender.  NG.  NR.  BS plus.  No organomegaly. EXTREMITIES:  Right leg swelling, redness, medially in severe tenderness to touch and palpation.  Also calf tenderness present.  Has discoloration of legs bilaterally.  Has also above the medial malleolus of right  leg. GU:  No CVA tenderness. SKIN:  Lesions in bilateral legs.  Right chest and back has scar from previous chest tube placements in 2009. LYMPH:  No palpable lymphadenopathy. MUSCULOSKELETAL:  Moving all four extremities. NEUROLOGIC:  Alert and oriented x3.  Cranial nerves II through XII are grossly intact.  Motor strength is 5/5 in all four extremities. Sensation is intact to light touch.  Gait normal.  FABERE signs negative. PSYCHIATRY:  Appropriate.  LABORATORY DATA ON ADMISSION:  PT 13.3, INR 0.99.  HOSPITAL COURSE BY PROBLEM: 1. Acute deep venous thrombosis.  Mr. Hurta came with right leg     pain of acute onset and has a history of bilateral lower extremity     deep venous thrombosis and bilateral pulmonary embolism in the past     due to  Factor V Leiden mutation.  He is on chronic Coumadin     therapy for that, but was noncompliant and did not take any for 4     weeks as per HPI.  So, considering all this and his duplex study     showing deep venous thrombosis, he was started on full-dose Lovenox     and was continued on Coumadin.  He was kept overnight in the     hospital due to the insurance issues because he lost his insurance     card and was not able to find the number to get his Lovenox.     A Child psychotherapist and the patient himself along with [place] worked out to     get in the Lovenox after he got the number from his insurance      company and was sent home with the prescription for Lovenox, which     he has taken by himself in the past and knows how to take the shots at home.     Also, he was sent to home with a followup appointment with Dr.     Alexandria Lodge at [facility], check his INR and make     further changes in Coumadin or Lovenox if needed.  DISCHARGE LABS:  PT 14.2, INR 1.08.  CBC; WBC 9.5, hemoglobin 16.2, hematocrit 46.3, platelets 197.  BMET; sodium 138, potassium 4.3, chloride 105, bicarb 28, BUN 11, creatinine 1.04, glucose 98,  calcium 9.2.  DISCHARGE VITAL SIGNS:  Temperature 98.1, pulse 70, respirations 20, blood pressure 123/76, O2 sats 98% on room air.  Mr. Adamczak is to be discharge in a relatively stable condition and is to be followed up with Dr. Alexandria Lodge  at [facility] on Friday, July 16, 2010 at 10 a.m.    ______________________________ Lyn Hollingshead, MD   ______________________________ Tilford Pillar, MD    RP/MEDQ  D:  07/17/2010  T:  07/18/2010  Job:  [id number]  Electronically Signed by Lyn Hollingshead MD on 07/20/2010 03:45:00 PM Electronically Signed by Tilford Pillar  on 07/28/2010 01:56:12 PM

## 2010-08-02 ENCOUNTER — Ambulatory Visit (INDEPENDENT_AMBULATORY_CARE_PROVIDER_SITE_OTHER): Payer: BC Managed Care – PPO | Admitting: Pharmacist

## 2010-08-02 DIAGNOSIS — D6859 Other primary thrombophilia: Secondary | ICD-10-CM

## 2010-08-02 DIAGNOSIS — Z7901 Long term (current) use of anticoagulants: Secondary | ICD-10-CM

## 2010-08-02 DIAGNOSIS — I82409 Acute embolism and thrombosis of unspecified deep veins of unspecified lower extremity: Secondary | ICD-10-CM

## 2010-08-02 NOTE — Patient Instructions (Signed)
Patient instructed to take medications as defined in the Anti-coagulation Track section of this encounter.  Patient instructed to take today's dose.  Patient verbalized understanding of these instructions.    

## 2010-08-02 NOTE — Progress Notes (Signed)
Anti-Coagulation Progress Note  SAVAN RUTA is a 30 y.o. male who is currently on an anti-coagulation regimen.    RECENT RESULTS: Recent results are below, the most recent result is correlated with a dose of 165 mg. per week: Lab Results  Component Value Date   INR 1.2 08/02/2010   INR 1.5 07/26/2010   INR 1.5 07/19/2010    ANTI-COAG DOSE:   Latest dosing instructions   Total [name] Tue Wed Thu Fri Sat   175 25 mg 25 mg 25 mg 25 mg 25 mg 25 mg 25 mg    (10 mg2.5) (10 mg2.5) (10 mg2.5) (10 mg2.5) (10 mg2.5) (10 mg2.5) (10 mg2.5)         ANTICOAG SUMMARY: Anticoagulation Episode Summary              Current INR goal 2.0-3.0 Next INR check 08/09/2010   INR from last check 1.2! (08/02/2010)     Weekly max dose (mg)  Target end date Indefinite   Indications PRIMARY HYPERCOAGULABLE STATE, DVT, Long term current use of anticoagulant   [facility] check location [facility] Preferred [facility]    Send INR reminders to [facility]   Comments        Provider Role Specialty Phone number   Blanch Media  Internal Medicine [phone]        ANTICOAG TODAY: Anticoagulation Summary as of 08/02/2010              INR goal 2.0-3.0     Selected INR 1.2! (08/02/2010) Next INR check 08/09/2010   Weekly max dose (mg)  Target end date Indefinite   Indications PRIMARY HYPERCOAGULABLE STATE, DVT, Long term current use of anticoagulant    Anticoagulation Episode Summary              INR check location [facility] Preferred lab    Send INR reminders to [facility]   Comments        Provider Role Specialty Phone number   Blanch Media  Internal Medicine [phone]        PATIENT INSTRUCTIONS: Patient Instructions  Patient instructed to take medications as defined in the Anti-coagulation Track section of this encounter.  Patient instructed to take today's dose.  Patient verbalized understanding of these instructions.        FOLLOW-UP Return in 7 days (on 08/09/2010)  for Follow up INR.  [name], III Pharm.D., CACP

## 2010-08-09 ENCOUNTER — Ambulatory Visit (INDEPENDENT_AMBULATORY_CARE_PROVIDER_SITE_OTHER): Payer: BC Managed Care – PPO | Admitting: Pharmacist

## 2010-08-09 DIAGNOSIS — Z7901 Long term (current) use of anticoagulants: Secondary | ICD-10-CM

## 2010-08-09 DIAGNOSIS — I82409 Acute embolism and thrombosis of unspecified deep veins of unspecified lower extremity: Secondary | ICD-10-CM

## 2010-08-09 DIAGNOSIS — D6859 Other primary thrombophilia: Secondary | ICD-10-CM

## 2010-08-09 LAB — POCT INR: INR: 2.3

## 2010-08-09 NOTE — Progress Notes (Signed)
Anti-Coagulation Progress Note  [name] [name] is a 30 y.o. male who is currently on an anti-coagulation regimen.    RECENT RESULTS: Recent results are below, the most recent result is correlated with a dose of 175 mg. per week: Lab Results  Component Value Date   INR 2.3 08/09/2010   INR 1.2 08/02/2010   INR 1.5 07/26/2010    ANTI-COAG DOSE:   Latest dosing instructions   Total [name] Tue Wed Thu Fri Sat   175 25 mg 25 mg 25 mg 25 mg 25 mg 25 mg 25 mg    (10 mg2.5) (10 mg2.5) (10 mg2.5) (10 mg2.5) (10 mg2.5) (10 mg2.5) (10 mg2.5)         ANTICOAG SUMMARY: Anticoagulation Episode Summary              Current INR goal 2.0-3.0 Next INR check 08/23/2010   INR from last check 2.3 (08/09/2010)     Weekly max dose (mg)  Target end date Indefinite   Indications PRIMARY HYPERCOAGULABLE STATE, DVT, Long term current use of anticoagulant   INR check location [facility] Preferred [facility]    Send INR reminders to [facility]   Comments        Provider Role Specialty Phone number   Blanch Media  Internal Medicine [phone]        ANTICOAG TODAY: Anticoagulation Summary as of 08/09/2010              INR goal 2.0-3.0     Selected INR 2.3 (08/09/2010) Next INR check 08/23/2010   Weekly max dose (mg)  Target end date Indefinite   Indications PRIMARY HYPERCOAGULABLE STATE, DVT, Long term current use of anticoagulant    Anticoagulation Episode Summary              INR check location [facility] Preferred lab    Send INR reminders to [facility]   Comments        Provider Role Specialty Phone number   Blanch Media  Internal Medicine [phone]        PATIENT INSTRUCTIONS: Patient Instructions  Patient instructed to take medications as defined in the Anti-coagulation Track section of this encounter.  Patient instructed to take today's dose.  Patient verbalized understanding of these instructions.        FOLLOW-UP Return in 2 weeks (on 08/23/2010) for  Follow up INR.[name], III Pharm.D., CACP

## 2010-08-09 NOTE — Patient Instructions (Signed)
Patient instructed to take medications as defined in the Anti-coagulation Track section of this encounter.  Patient instructed to take today's dose.  Patient verbalized understanding of these instructions.    

## 2010-08-10 ENCOUNTER — Ambulatory Visit: Payer: Self-pay | Admitting: Internal Medicine

## 2010-08-11 ENCOUNTER — Encounter: Payer: Self-pay | Admitting: Ophthalmology

## 2010-08-19 ENCOUNTER — Emergency Department (HOSPITAL_COMMUNITY)
Admission: EM | Admit: 2010-08-19 | Discharge: 2010-08-20 | Disposition: A | Discharge status: Home / Self Care | Payer: BC Managed Care – PPO | Attending: Emergency Medicine | Admitting: Emergency Medicine

## 2010-08-19 DIAGNOSIS — M79609 Pain in unspecified limb: Secondary | ICD-10-CM | POA: Insufficient documentation

## 2010-08-19 DIAGNOSIS — M7989 Other specified soft tissue disorders: Secondary | ICD-10-CM | POA: Insufficient documentation

## 2010-08-20 ENCOUNTER — Ambulatory Visit (HOSPITAL_COMMUNITY): Payer: BC Managed Care – PPO | Attending: Emergency Medicine

## 2010-08-20 DIAGNOSIS — M79609 Pain in unspecified limb: Secondary | ICD-10-CM | POA: Insufficient documentation

## 2010-08-20 LAB — POCT I-STAT, CHEM 8
Chloride: 102 mEq/L (ref 96–112)
Glucose, Bld: 92 mg/dL (ref 70–99)
HCT: 49 % (ref 39.0–52.0)
Hemoglobin: 16.7 g/dL (ref 13.0–17.0)
Potassium: 4.2 mEq/L (ref 3.5–5.1)
Sodium: 140 mEq/L (ref 135–145)

## 2010-08-20 LAB — PROTIME-INR: Prothrombin Time: 15.7 seconds — ABNORMAL HIGH (ref 11.6–15.2)

## 2010-08-23 ENCOUNTER — Ambulatory Visit (INDEPENDENT_AMBULATORY_CARE_PROVIDER_SITE_OTHER): Payer: BC Managed Care – PPO | Admitting: Pharmacist

## 2010-08-23 DIAGNOSIS — D6859 Other primary thrombophilia: Secondary | ICD-10-CM

## 2010-08-23 DIAGNOSIS — I82409 Acute embolism and thrombosis of unspecified deep veins of unspecified lower extremity: Secondary | ICD-10-CM

## 2010-08-23 DIAGNOSIS — Z7901 Long term (current) use of anticoagulants: Secondary | ICD-10-CM

## 2010-08-23 LAB — POCT INR: INR: 1.5

## 2010-08-23 NOTE — Patient Instructions (Signed)
Patient instructed to take medications as defined in the Anti-coagulation Track section of this encounter.  Patient instructed to take today's dose.  Patient verbalized understanding of these instructions.    

## 2010-08-23 NOTE — Progress Notes (Signed)
[name]'s history reviewed.  It appears he has had recurrent DVT's, hence the indication for long standing coumadin therapy.  Unfortunately, he has difficulty with taking the medication as prescribed.  Will continue to encourage compliance and try to explore why he has such difficulty in hopes of devising ways to improve his compliance.

## 2010-08-23 NOTE — Progress Notes (Signed)
Anti-Coagulation Progress Note  [name] [name] is a 30 y.o. male who is currently on an anti-coagulation regimen.    RECENT RESULTS: Recent results are below, the most recent result is correlated with a dose of 0 mg. per week: Lab Results  Component Value Date   INR 1.5 08/23/2010   INR 1.23 08/19/2010   INR 2.3 08/09/2010    ANTI-COAG DOSE:   Latest dosing instructions   Total [name] Tue Wed Thu Fri Sat   175 25 mg 25 mg 25 mg 25 mg 25 mg 25 mg 25 mg    (10 mg2.5) (10 mg2.5) (10 mg2.5) (10 mg2.5) (10 mg2.5) (10 mg2.5) (10 mg2.5)         ANTICOAG SUMMARY: Anticoagulation Episode Summary              Current INR goal 2.0-3.0 Next INR check 08/30/2010   INR from last check 1.5! (08/23/2010)     Weekly max dose (mg)  Target end date Indefinite   Indications PRIMARY HYPERCOAGULABLE STATE, DVT, Long term current use of anticoagulant   INR check location [facility] Preferred lab    Send INR reminders to [facility]   Comments        Provider Role Specialty Phone number   Blanch Media  Internal Medicine [phone]        ANTICOAG TODAY: Anticoagulation Summary as of 08/23/2010              INR goal 2.0-3.0     Selected INR 1.5! (08/23/2010) Next INR check 08/30/2010   Weekly max dose (mg)  Target end date Indefinite   Indications PRIMARY HYPERCOAGULABLE STATE, DVT, Long term current use of anticoagulant    Anticoagulation Episode Summary              INR check location [facility] Preferred lab    Send INR reminders to [facility]   Comments        Provider Role Specialty Phone number   Blanch Media  Internal Medicine [phone]        PATIENT INSTRUCTIONS: Patient Instructions  Patient instructed to take medications as defined in the Anti-coagulation Track section of this encounter.  Patient instructed to take today's dose.  Patient verbalized understanding of these instructions.        FOLLOW-UP Return in 7 days (on 08/30/2010) for  Follow up INR.  [name], III Pharm.D., CACP

## 2010-08-24 ENCOUNTER — Encounter: Payer: Self-pay | Admitting: *Deleted

## 2010-08-24 NOTE — Progress Notes (Signed)
Pt presents with form from employer, he states it was given to him yesterday after he presented a note written by [place] groce asking for more specific activity instructions. Will present this to dr patel the pcp and possibly the attending then will have a copy scanned to chart.

## 2010-08-30 ENCOUNTER — Ambulatory Visit (INDEPENDENT_AMBULATORY_CARE_PROVIDER_SITE_OTHER): Payer: BC Managed Care – PPO | Admitting: Pharmacist

## 2010-08-30 DIAGNOSIS — I82409 Acute embolism and thrombosis of unspecified deep veins of unspecified lower extremity: Secondary | ICD-10-CM

## 2010-08-30 DIAGNOSIS — D6859 Other primary thrombophilia: Secondary | ICD-10-CM

## 2010-08-30 DIAGNOSIS — Z7901 Long term (current) use of anticoagulants: Secondary | ICD-10-CM

## 2010-08-30 LAB — POCT INR: INR: 3.7

## 2010-08-30 NOTE — Patient Instructions (Signed)
Patient instructed to take medications as defined in the Anti-coagulation Track section of this encounter.  Patient instructed to take today's dose.  Patient verbalized understanding of these instructions.    

## 2010-08-30 NOTE — Progress Notes (Signed)
Anti-Coagulation Progress Note  [name] [name] is a 30 y.o. male who is currently on an anti-coagulation regimen.    RECENT RESULTS: Recent results are below, the most recent result is correlated with a dose of 175 mg. per week: Lab Results  Component Value Date   INR 3.7 08/30/2010   INR 1.5 08/23/2010   INR 1.23 08/19/2010    ANTI-COAG DOSE:   Latest dosing instructions   Total [name] Tue Wed Thu Fri Sat   160 25 mg 20 mg 25 mg 20 mg 25 mg 20 mg 25 mg    (10 mg2.5) (10 mg2) (10 mg2.5) (10 mg2) (10 mg2.5) (10 mg2) (10 mg2.5)         ANTICOAG SUMMARY: Anticoagulation Episode Summary              Current INR goal 2.0-3.0 Next INR check 09/14/2010   INR from last check 3.7! (08/30/2010)     Weekly max dose (mg)  Target end date Indefinite   Indications PRIMARY HYPERCOAGULABLE STATE, DVT, Long term current use of anticoagulant   INR check location [facility] Preferred lab    Send INR reminders to [facility]   Comments        Provider Role Specialty Phone number   Blanch Media  Internal Medicine [phone]        ANTICOAG TODAY: Anticoagulation Summary as of 08/30/2010              INR goal 2.0-3.0     Selected INR 3.7! (08/30/2010) Next INR check 09/14/2010   Weekly max dose (mg)  Target end date Indefinite   Indications PRIMARY HYPERCOAGULABLE STATE, DVT, Long term current use of anticoagulant    Anticoagulation Episode Summary              INR check location [facility] Preferred lab    Send INR reminders to [facility]   Comments        Provider Role Specialty Phone number   Blanch Media  Internal Medicine [phone]        PATIENT INSTRUCTIONS: Patient Instructions  Patient instructed to take medications as defined in the Anti-coagulation Track section of this encounter.  Patient instructed to take today's dose.  Patient verbalized understanding of these instructions.        FOLLOW-UP Return in 2 weeks (on 09/14/2010) for  Follow up INR.  [name], III Pharm.D., CACP

## 2010-08-31 NOTE — Discharge Summary (Signed)
NAME:  [name] [name], [name] [name]               ACCOUNT NO.:  [id number]   MEDICAL RECORD NO.:  [id number]          PATIENT TYPE:  INP   LOCATION:  2024                         FACILITY:  [facility]   PHYSICIAN:  Mick Sell, MD DATE OF BIRTH:  03-07-1981   DATE OF ADMISSION:  11/17/2007  DATE OF DISCHARGE:  12/10/2007                               DISCHARGE SUMMARY   DISCHARGE DIAGNOSES   1. Bilateral lower lobe pulmonary embolism with associated pulmonary      infarct.  Posterior right extrapleural hematoma and right      hemothorax.  2. Hypercoagulability secondary to Factor V Leiden Heterozygosity,      Lupus anticoagulant.  3. Bilateral deep venous thrombosis.   DISCHARGE MEDICATIONS   1. Colace 100 mg 1 pill 2 times daily.  2. Ferrous sulfate 325 mg 1 pill 3 times daily.  3. Protonix 40 mg 1 pill once daily, 30 minutes before breakfast.  4. Coumadin 22.5 mg 1 pill once daily.  5. Dilaudid 2 mg 1 pill every 4 hours as needed for pain.  6. Milk of magnesia 30 mL once daily by mouth.  7. Oxycodone 15 mg 1 pill every 4 hours as needed for pain.  8. Ambien 5 mg 1 pill at bedtime as needed.  9. Tucks hemorrhoidal rectal suppositories as needed.  10.Guaifenesin 1200 mg 1 pill every 4 hours as needed for cough.  11.Ativan 1 mg 1 pill 3 times daily as needed for anxiety.   DISPOSITION AND FOLLOWUP   The patient is to followup with Dr. Radonna Ricker in the Outpatient Clinic,  [facility] on December 18, 2007, at 9:20 a.m and  with Dr. Shaune Leeks on December 13, 2007 at 2 p.m. at [facility], Outpatient [facility]  [facility].  The patient needs to be monitored for any swelling  in the leg as well as any chest pain or symptoms of pulmonary embolism.  The patient needs to be monitored for PT/INRs and the dose of Coumadin  needs to be adjusted depending upon the INR values.  The patient needs  to be monitored for his CBCs to make sure that hemoglobin is not  dropping.   PROCEDURE  PERFORMED   1. CT angiography of the chest. November 17, 2007. Impression:  Bilateral      lower lobe pulmonary emboli with associated atelectasis/pulmonary      infarct.  4 x 8 x 10 cm posterior right extrapleural hematoma with      findings worrisome for internal active arterial extravasation.      Moderate complex right pleural fluid likely hemothorax.   1. IVC filter placement: on November 17, 2007.  Impression:  Ultrasound      and fluoroscopically-guided infrarenal IVC filter.   1. Placement of large hole right-sided chest tube was done on November 18, 2007 by Dr. Sheliah Plane.   1. CT of the chest: on November 19, 2007.  Impression:  Increased size of      the likely pleural and extrapleural hematoma in the right  hemothorax.  These results in mass effect upon the right lung with      extensive right middle lobe and lower lobe airspace disease.  This      airspace disease is primarily felt to represent atelectasis.  A      component of infarction or pneumonia cannot be entirely excluded.      A right-sided chest tube in place with tiny anterior right      pneumothorax.  Simple focus of air in the right middle lobe could      represent extra alveolar air, patchy ground-glass opacities in the      left upper lobe, most likely foci of atelectasis.   1. CT of the abdomen without contrast: November 19, 2007. Impression:  No      acute abdominal process, moderate right hemidiaphragm elevation.   1. CT of the pelvis: November 19, 3007.  No acute pelvic process.   1. Right-sided mini thoracotomy, evacuation of hematoma, drainage of      subpleural hematoma, and placement of  On-Q device by Dr. Sheliah Plane on November 21, 2007.   1. Ultrasound of the abdomen. December 05, 2007. Impression:      Unremarkable sonographic appearance of the abdomen.  The pancreas      is not visualized.  Bilateral pleural effusion was noted.   1. Duplex ultrasound of the liver: December 05, 2007.   Impression:      Unremarkable Doppler ultrasound examination of the liver.    1. Doppler study of the leg.  Impression:  Acute DVT noted in the      right common femoral, femoral, profunda, popliteal, gastrocnemius,      and posterior branch of the posterior tibial vein.  This is a      complication of a previous DVT.  The common femoral, femoral,      profunda, and popliteal veins appear occluded.  There is mild flow      in the gastrocnemius and posterior tibial vein.  There is no acute      superficial thrombosis of the right greater saphenous at the      confluence with the common femoral veins.  The lesser saphenous      vein saphenous flow appears increased from previous study.  Duplex      of the left lower extremity revealed what appears to be acute DVT      with early recanalization on the popliteal and gastrocnemius      region.   CONSULTATIONS   1. Pulmonology and critical care: November 17, 2007. Physician Dr. Cyril Mourning.  Recommendations:  Given the active bleeding on Lovenox and      Coumadin, I would suggest that we proceed with an IVC filter.  This      does not quantify the failure of Coumadin.  After reconstructing      the event, it seems that he may have had a pulmonary embolism      prior.  However, the pulmonary infract and worsening seems to be      over the last 24-48 hours duration.  He seems to be tolerating this      hemodynamically and oxygenation wise quite well.  However, I am      afraid that with further anticoagulation, he may acutely bleed into      his pleural space and chest wall.   1. Thoracic surgery: November 17, 2007. Physician  Dr. Sheliah Plane.      Recommendation:  Initially, we will proceed with placement of a      large-holed right-sided chest tube to drain the hemothorax, hold      anticoagulant at this point, and follow serial hematocrit and chest      x-rays and chest tube output.   1. Hematology consultation.  December 03, 2007.  Physician  Dr. Ottie Glazier P.      [place]. Recommendation:  Recurrent DVT since about 30 years of age      in the patient with an underlying coagulopathy, recent acute      pulmonary emboli, and the bleeding to the right chest possibly      secondary to the pulmonary emboli, IVC filters now in place.  I      cannot interpret the potency as yet on the Coumadin.  It is not      clear that this is antiphospholipid syndrome with IgA elevation.      Factor V Leiden heterozygous noted.  Regardless, he will need long-      term indefinite anticoagulation.  I have discussed with my      coagulation colleague and we are concerned that he has not been      treated for the acute DVT with heparin or Lovenox at least this      admission, although this clearly was not possible initially.  If he      has a new clot by Doppler of the right lower extremity today, I      would begin heparin infusion without bolus by pharmacy, as this      would be quickly reversible if other bleeding occurred.  It is not      clear that he has failed Coumadin, particularly with the history of      no clot for 3 years while he was being closely monitored.  Long-      term Coumadin still seems reasonable to try and would certainly be      easier than Lovenox, though the patient assistance is available if      this becomes necessary.  The patient understands that he will need      to be anticoagulated long-term and will need to have regular      monitoring, which would be done at our office.  He will need a      repeat hypercoagulable panel to rule out lupus anticoagulant later.   BRIEF ADMITTING HISTORY   This is a 30 year old male with past medical history of DVT , on Lovenox  120 units twice a day, Coumadin 15 mg once daily comes to the ER with  chief complaint of a pain over the back of the right chest since this  a.m. August 1. 2009.  The patient was in the hospital for the management  of DVT and was discharged on November 16, 2007.  The patient had this pain,  but it is lesser in the intensity for the last 5-6 days, while he was in  the hospital and had a chest x-ray on November 12, 2007 which showed no  active cardiopulmonary disease.  He says that the pain slowly got better  and was discharged yesterday.  He complains of pain over the back of the  right chest, constant, sharp pain, 10/10 in severity, aggravated by deep  inspiration and moving and reliving by nothing.  The pain is more on the  back but has extended up  to the front of the chest.  No history of  fever.  History of shortness of breath, history of associated nausea.  No history of vomiting.  History of associated shortness of breath,  anxiety.   ALLERGIES   The patient is not allergic to any medication.   PAST MEDICAL HISTORY   Significant for DVT for the last 8-9 years and the patient is on  Coumadin on and off.  The patient did not take any Coumadin for the last  1 year.   PHYSICAL EXAMINATION   VITAL SIGNS:  Temperature 98.2, blood pressure 134/76, pulse 86,  respiratory rate 20, and oxygen saturation 98% on 2 liter.  GENERAL:  The patient is in severe pain, feeling short of breath.  HEENT:  Eyes, normal appearance.  Extraocular muscles are intact.  Pupils are equal, round, and reactive to light.  ENT examination all  normal.  NECK:  Supple with full range of motion.  RESPIRATORY:  Breath sounds clear and equal bilaterally.  Severe chest  pain and tenderness over the back of chest.  CARDIOVASCULAR:  Normal S1 and S2 with regular rate and rhythm.  No  murmurs.  No rubs.  No gallops.  GASTROINTESTINAL:  Soft and nontender.  No hepatosplenomegaly.  Bowel  sounds normal.  EXTREMITIES:  Full range of motion.  No tenderness.  SKIN:  Clear.  Normal.  No bruits noted over the back of the chest.  MUSCULOSKELETAL:  Positive for low back pain.  Negative for rash or  injury.  NEUROLOGIC:  Alert and oriented x3.  Psych is appropriate.    LABORATORY DATA   1. White count 10.4, hemoglobin 15.4, hematocrit 47, platelet count      386.  2. Basic metabolic panel, sodium 136, potassium 4.3, chloride 101, and      bicarb 32.  BUN 13, creatinine 1.1, glucose 135.  3. PT 21.9.  INR 1.8.  PTT 45.  4. EKG was significant for sinus rhythm.  Normal axis, normal PQRS      interval,  ST and T changes T- waves are normal and EKG was      unchanged in comparison with the previous EKG done on November 12, 2007.   HOSPITAL COURSE   1. Bilateral lower lobe pulmonary emboli with associated      atelectasis/pulmonary infarct, posterior right-sided extrapleural      hematoma, right-sided hemothorax.  A CT-Angio was done in the ER,      November 17, 2007  revealed the findings suggestive as mentioned      above.  We consulted Pulmonology and Critical Care Medicine, hold      Lovenox as well as Coumadin.  The patient was admitted in the ICU.      The patient was put on IVC filters on November 17, 2007. Thoracic      Surgery was consulted on November 18, 2007 and they put a right-sided      large hole chest tube on November 19, 2007. CT chest on November 19, 2007      showed increase in the size of the right extrapleural hematoma      there by resulting in mass effect on the right lung causing right      middle and lower lobe airspace disease. CVTS decided to a right      hemithoracotomy to evauate the right hematoma. On November 21, 2007,      the patient had a right-sided hemi thoracotomy  with evacuation of      the right sided hematoma and subpleural hematoma and placement of      On-Q device on November 21, 2007.  The patient was monitored in the      ICU for the initial couple of days with serial chest x-ray's and      was treated with pain medication as well as medications for anxiety      control. Gradually, the bleeding from the chest tube stopped and      thereafter was removed. The patients chest pain and SOB got better      when he was transferred to  the Step down unit. During the hospital      stay, the patient's liver enzymes were slightly elevated, likely      secondary to medication that he was taking for pain control.  We      stopped all the medications that could potentially elevate his      liver enzymes.  Liver enzymes gradually improved.  We did an      ultrasound of the abdomen to rule out the possibility of      cholelithiasis and biliary ductal dialatation.  Ultrasound was      negative for any biliary duct dilatation.  Doppler study of the      liver negative for acute hepatobiliary pathology and Hepatic vein      thrombosis.  On the day of discharge, the patient's alkaline      phosphatase was slightly elevated and total bilirubin, AST, ALT      were within normal limits.  During the hospital stay, the patient's      hemoglobin dropped, secondary to blood loss; however, we treated      appropriately with packed red blood cells transfusion and started      him on iron supplementation.  The patient is advised to continue      iron supplementation for at least 3-6 months until his Hb comes to      the normal range. On the day of discharge, the patient was stable,      alert, and oriented x3 but was having mild pain in the right chest.      The patient is to follow up with Dr. Radonna Ricker in the Outpatient Clinic      [facility] as well as with Dr.  Chancy Milroy in the      Outpatient Clinic [facility] as mentioned above.   1. Hypercoagulability.  The patient had a complete workup for the      hypercoagulability during the previous hospital admission and was      diagnosed with factor V Leiden heterozygosity as well as lupus      anticoagulant positive.  The patient was then sent home during the      prior hospital stay on Lovenox as well as Coumadin.  We consulted      hematologist Dr. Jama Flavors for further management and workup.      During the hospital stay the patient developed right sided leg pain       and was diagnosed with acute DVT involving the entire right side of      the leg.  The hematologist recommended treating the acute DVT with      heparin as well as Coumadin.  The heparin was continued for a      period of one week.  The symptoms of acute DVT got better gradually  and the patient was completely devoid of pain on the day of     discharge.  The patient is to continue taking the Coumadin      indefinitely and is to follow up with Dr. Chancy Milroy in the Coumadin      Clinic in [facility].  We consulted      Interventional Radiology for a possible removal of IVC filters.      Interventional Radiology advised, with the active DVT in the right      leg, they recommended not to remove the IVC filters.   1. Bilateral DVT.  The patient had a history of chronic DVT in both      legs.  During the hospital stay, the patient developed right-sided      leg pain and the Doppler studies diagnosed him as acute DVT.  The      patient was started on heparin as well as Coumadin.  The pain in      the right leg gradually decreased and on the day of discharge, the      patient does not have any pain and was not in any acute distress.      He is to follow up in the Outpatient Clinic as well as with      Coumadin Clinic in the [facility].   DISCHARGE VITALS   Temperature 97.9, blood pressure 113/75, pulse rate 96, respiratory rate  18, and 100% oxygen saturation on room air.   DISCHARGE LABS   1. CBC:  White count 8.7, hemoglobin 9.7, hematocrit 29.3, and      platelet count 505.  2. Comprehensive metabolic panel, sodium 141, potassium 4.0, chloride      53, bicarb 29, BUN 7, creatinine 0.78, glucose 98, total bilirubin      0.3, alkaline phosphatase 135.  AST 32, ALT 59, total protein 6.7,      albumin 2.5, calcium 9.9.  3. PT 34.8, INR 2.8, and heparin level 0.77.   On the day of discharge, the patient is stable alert and oriented x3 and  is not in  any acute distress.  The patient is to follow up with Dr.  Radonna Ricker and Dr.  Chancy Milroy in the [facility] [place] [facility] on  the dates as mentioned above.  The patient is advised to restrict his  regular activities for at least 1 to 2 months.      Blondell Reveal, MD  Electronically Signed      Mick Sell, MD  Electronically Signed    VB/MEDQ  D:  12/30/2007  T:  12/31/2007  Job:  [id number]   cc:   Lennis P. Darrold Span, M.D.  Oretha Milch, MD  Sheliah Plane, MD

## 2010-08-31 NOTE — Consult Note (Signed)
NAME:  [name] [name], [name] [name]               ACCOUNT NO.:  [id number]   MEDICAL RECORD NO.:  [id number]          PATIENT TYPE:  REC   LOCATION:  FOOT                         FACILITY:  [facility]   PHYSICIAN:  Lenon Curt. Chilton Si, M.D.  DATE OF BIRTH:  05-Sep-1980   DATE OF CONSULTATION:  DATE OF DISCHARGE:                                 CONSULTATION   HISTORY:  [facility], referred by  Dr. Comer Locket and Dr. Gayla Doss of [facility].   PROBLEM:  Nonhealing wounds of the legs.   HISTORY:  A 30 year old male [facility]  today for evaluation and treatment of chronic venous insufficiency and  severe venous stasis disease accompanied by multiple nonhealing  ulcerations.  He has quite fragile skin as a result of the venous  insufficiency and skin changes induced by that.  He had increasing pain  in his left leg and it hurts to stand or walk over the last few days.  There has been no fever and no odor, leg wounds have been present off  and on for at least 3 years.  He has had multiple episodes of prior  venous thrombosis and in fact was hospitalized recently for a pulmonary  embolus.  He remains on chronic Coumadin for these problems.  He has  been reported as having a hypercoagulable state.   SOCIAL HISTORY:  He works in Designer, industrial/product for [employer].  He continues  to smoke and does not drink much.  He has a child.  He says he is trying  to straighten out his life.   MEDICATIONS:  Coumadin and Vicodin.   REVIEW OF SYSTEMS:  Essentially unremarkable except as pertains to his  ulcerations, venous insufficiency and pulmonary embolus.   PHYSICAL EXAMINATION:  VITAL SIGNS:  Temperature 98.1, pulse 73,  respirations 20, blood pressure 113/72.  GENERAL:  Alert, oriented, cooperative male.  HEAD, EYES, EARS, NOSE AND THROAT:  Unremarkable.  NECK:  Supple.  No thyromegaly or bruit.  CHEST:  Clear.  HEART:  Sounds without  gallop, murmur, click or rub.  ABDOMEN:  Nontender.  LUNGS:  Clear to auscultation and percussion.  EXTREMITIES:  Normal pulsations.  SKIN:  Chronic venous insufficiency, varicose veins with ulcers and  inflammation, stasis dermatitis worse on the left as compared to the  right.   PROBLEM:  Varicose veins with ulcer and inflammation.   PLAN:  Wounds were cultured and we put him on doxycycline 100 mg twice  daily for 10 days.  Silvadene was applied to open areas.  Ace wrap was  used due to the problems of him working in anything other than this for  compression.  He is to return in 1 week for reevaluation.      Lenon Curt Chilton Si, M.D.  Electronically Signed     AGG/MEDQ  D:  11/07/2008  T:  11/08/2008  Job:  [id number]   cc:   Blondell Reveal, MD

## 2010-08-31 NOTE — Discharge Summary (Signed)
NAME:  [name] [name], [name] [name]               ACCOUNT NO.:  [id number]   MEDICAL RECORD NO.:  [id number]          PATIENT TYPE:  INP   LOCATION:  3011                         FACILITY:  [facility]   PHYSICIAN:  Madaline Guthrie, M.D.    DATE OF BIRTH:  1981/04/05   DATE OF ADMISSION:  11/12/2007  DATE OF DISCHARGE:  11/16/2007                               DISCHARGE SUMMARY   DISCHARGE DIAGNOSIS:  1. Right leg deep vein thrombosis.  2. Lupus anticoagulant, positive.   SECONDARY DIAGNOSES:  1. History of left leg deep vein thrombosis.  2. History of incarceration.  3. History of tobacco abuse.   DISCHARGE MEDICATIONS:  1. Coumadin 15 mg p.o. daily.  2. Lovenox 120 mg injections twice daily.   DISPOSITION FOLLOWUP:  Mr. Carton is being discharged from the  hospital on November 16, 2007, stable improved condition.  He will follow up  with Dr. Audria Nine at the [facility] on November 30, 2007, at 12  noon.  Additionally, Mr. Glaude will return to the [facility] to follow up with Dr. Alexandria Lodge on November 19, 2007, at 9 o'clock in  the morning.   PROCEDURES PERFORMED:  A 2-D echo on November 03, 2007, which reveals left  ventricular systolic function normal with an EF between 55-65%   CONSULTATIONS:  None.   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  DVT was found in prison  and treated with Lovenox and Coumadin; however, the patient is not on  chronic Coumadin therapy.  The patient was recently evaluated for a left  DVT in January and March 2009 where he was treated with Coumadin, but  the patient did not have proper followup as he was unable to obtain a  primary care physician.  The patient recently presented to the emergency  department on November 06, 2007, with new onset of right leg DVT.  All prior  DVTs had been in the left lower  extremity.  The patient was given  Lovenox and Coumadin prescriptions and discharged from the emergency  department.  The patient was unable to afford his Lovenox and only took  his Coumadin 5 mg daily after leaving the emergency department.  Again,  there were no INRs checked since the patient did not follow up with a  primary care physician.  Today, the patient comes in with increased pain  in the right lower leg, which he described as cramping.  Three days ago,  the patient had chest pain and shortness of breath along with increased  leg pain.  Initial chest pain lasted approximately 2 hours and has not  reappeared.  The patient has no history of heart disease.   PHYSICAL EXAMINATION:  ADMISSION VITAL SIGNS:  Temperature 98.3, blood  pressure 123/77, pulse 91, respirations 22, and oxygen saturation 99% on  room air.  GENERAL:  No acute distress.  EYES:  Pupils equal, round, and reactive to light and accommodation.  ENT:  Clear  oropharynx.  NECK:  Supple.  RESPIRATION:  Clear to auscultation bilaterally with good air movement.  CARDIOVASCULAR:  Regular rate and rhythm.  No murmurs, rubs, or gallops.  GI:  Soft, nontender, and nondistended.  Positive bowel sounds.  EXTREMITIES:  Left lower extremity shows chronic venous stasis changes  with multiple ulcerations.  Right lower extremity shows nonpitting edema  and a palpable cord on the posterior aspect of the leg.  The entire left  lower extremity is extremely tender to palpation.  GU:  No CVA tenderness.  SKIN: Moist.  LYMPH:  No cervical supraclavicular accident or axillary  lymphadenopathy.  MUSCULOSKELETAL:  5/5 strength in the upper and lower extremities  bilaterally.  NEURO:  Nonfocal.  PSYCH:  Appropriate and pleasant.   ADMISSION LABORATORY INFORMATION:  Sodium 137, potassium 3.8, chloride  105, bicarb 24, BUN 12, creatinine 0.9, and glucose 103.  White blood  cell count 12.4, hemoglobin 15.3, hematocrit 45.2, and  platelets 227.  Lower extremity Doppler which was positive for DVT in the popliteal,  gastrocnemius, and distal femoral vein and also positive for superficial  venous thrombosis in the lesser saphenous vein.  INR of 1.2, PT of 15.7,  and PTT 36.   HOSPITAL COURSE:  1. Right lower extremity deep vein thrombosis.  The patient was      admitted to the [facility] in order to be      evaluated for his new-onset deep vein thrombosis of the right lower      extremity.  The patient was restarted on Lovenox on the first day      of admission.  Lovenox and Coumadin were dosed per Pharmacy.  The      primary team order a host of laboratory tests to work-up the      patient's hypercoagulability.  It was discovered that the patient      is lupus anticoagulant positive.  Additionally, on his initial night of hospitalization, the patient had  an episode of tachypnea.  The primary team was called to the bedside to  find the patient very tearful and anxious.  Lungs were examined and  showed good air movement bilaterally.  EKG showed normal sinus rhythm.  The patient had good oxygen saturations on room air at the time.  The  patient's expressed that he was extremely nervous about getting a  pulmonary embolus.  The primary team decided to treat this with  analgesics and anxiolytics.  The patient's anxiety subsided and he slept  comfortably throughout the night.  Throughout his hospitalization, the  patient maintained a baseline tachycardia.  A 2-D echo was obtained to  assess any increased PA pressures.  The results of this test were  largely unremarkable.  The patient continued his Lovenox and Coumadin  therapy, with daily dosing per pharmacy.  His INR slowly increased to a  level of 1.8.  On the day of discharge, the patient was stable and the  treatment team decided to discharge him with enough Lovenox to make it  through the weekend and with instructions to visit the [facility] on Monday morning following discharge.  The  primary team educated the patient on his need for lifelong  anticoagulation therapy.  1. Anxiety.  As mentioned under right lower extremity deep vein      thrombosis, the patient had an episode of anxiety during his first      night of hospitalization.  The patient was treated symptomatically  with Ativan.  The primary team decided to switch the patient to      Xanax dose 3 times daily.  The patient tolerated this medicine      without any problems.  When the patient follows up with his primary      care physician, Dr. Audria Nine at [facility], she will need to      assess his baseline anxiety level and need for future anxiolytic      therapy.   DISCHARGE LABORATORY DATA:  CBC:  White blood cell count 14.0,  hemoglobin 15.4, hematocrit 45.3, and platelets 280.  PTT 21.5, INR of  1.8.   VITAL SIGNS:  Temperature 100.0, blood pressure 122/79, pulse 77,  respirations 16, and the patient was saturating 98% on room air.   The patient was discharged in stable condition.      Genia Del, MD  Electronically Signed      Madaline Guthrie, M.D.  Electronically Signed    ZF/MEDQ  D:  11/19/2007  T:  11/20/2007  Job:  [id number]

## 2010-08-31 NOTE — Op Note (Signed)
NAME:  [name] [name], [name] [name]               ACCOUNT NO.:  [id number]   MEDICAL RECORD NO.:  [id number]          PATIENT TYPE:  INP   LOCATION:                               FACILITY:  [facility]   PHYSICIAN:  Sheliah Plane, MD    DATE OF BIRTH:  1981/03/17   DATE OF PROCEDURE:  DATE OF DISCHARGE:                               OPERATIVE REPORT   PROCEDURE PERFORMED:  Placement of large-bore right chest tube.   SURGEON:  Sheliah Plane, MD.   BRIEF HISTORY:  As noted in the patient's consult, he is a 30 year old  with a right spontaenous bleed into the chest wall related to  anticoagulation, in addition right hemothorax which is enlarged since  normal-appearing chest x-ray on November 12, 2007.  Placement of a chest  tube on the right was recommended to the patient, who agreed and signed  informed consent.  After confirming side and patient's identification,  the right chest was prepped with Hibiclens, 1% lidocaine was infiltrated  locally under sterile conditions, and a 36 chest tube was placed between  the ribs in midaxillary line.  Approximately 500 mL of dark blood was  returned initially.  Followup chest x-ray is pending.      Sheliah Plane, MD  Electronically Signed     EG/MEDQ  D:  11/18/2007  T:  11/19/2007  Job:  [id number]
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
## 2010-08-31 NOTE — Consult Note (Signed)
NAME:  [name] [name], [name] [name]               ACCOUNT NO.:  [id number]   MEDICAL RECORD NO.:  [id number]          PATIENT TYPE:  INP   LOCATION:  2101                         FACILITY:  [facility]   PHYSICIAN:  Sheliah Plane, MD    DATE OF BIRTH:  1980-09-28   DATE OF CONSULTATION:  11/18/2007  DATE OF DISCHARGE:                                 CONSULTATION   REQUESTING PHYSICIAN:  Oretha Milch, MD   PRIMARY CARE PHYSICIAN:  Ileana Roup, MD   REASON FOR CONSULTATION:  I was asked to see the patient urgently this  morning approximately at 8 a.m. because of concern of right chest  hemothorax in a patient who is anticoagulated and with a history of  pulmonary embolus.  The patient's history start with 7-8 years ago when  he developed a DVT while in prison.  He was at that time evaluated and  labeled with hypercoagulable state and started on Coumadin therapy  intermittently.  Since release from prison, he has intermittently taken  his Coumadin.  The exact follow up of this is very sketchy in his  medical record.  However, it is obvious that he was seen several times  in the [facility] with probable new thrombosis of vein in  his right leg with chest pain and ultimately diagnosed with a pulmonary  embolus.  A chest x-ray on November 12, 2007, was clear.  This morning, he  now has almost whited out right chest.  A CT scan done yesterday shows  hemorrhage into the right chest wall posteriorly and a right hemothorax  and bilateral pulmonary emboli.  Currently, the patient's INR is 2.  A  caval filter was placed through the low right IJ line last night.  The  patient had increasing pain and tachycardia overnight.  This morning  chest x-ray shows increased fluid on the right compared to film  yesterday.  Hemoglobin is 13.9 this morning, yesterday was 15.4.   ALLERGIES:  None known.   PAST MEDICAL HISTORY:  As noted above.  History of DVT.   No previous surgery.   medications at the  time of admission are poorly recorded and the patient  had been prescribed Lovenox but did not have 900 dollars to buy it, so  it is unclear how much he took.  He was given it once re-admitted to the  hospital.   SOCIAL AND FAMILY HISTORY:  The patient is a smoker, married, has a 85-  month-old child.  Family history of his mother and father unknown.   REVIEW OF SYSTEMS:  As recorded from the patient's chart, currently it  is too uncomfortable to go through the full, but he complaints of  dyspnea, chest pain, diaphoresis, nausea.  Denies any change in bowel  habits.  No blood in his stool or urine.   PHYSICAL EXAMINATION:  His blood pressure is 130/76, heart rate is 127,  sinus rhythm, respiratory rate is 25, and O2 sats 95% on 40% mask.  The  patient obviously is pale-appearing feels uncomfortable and is hesitant  to move.  He has  no carotid bruits.  Decreased breath sounds throughout  the right chest, clear on the left.  Abdominal exam is benign without  tenderness.  He has a bandage very low just above the clavicle on the  right from the placement of his caval filter last p.m.  He has venous  stasis change in both lower extremities, but no significant lower  extremity edema.   Laboratory findings show an INR of 2, PTT of 40, hemoglobin of 13.9.  As  noted above, CT scan and chest x-rays were reviewed.  I have discussed  the case with Dr. Vassie Loll and the patient denies any history of trauma most  likely spontaneous bleed into the chest wall related to anticoagulation  and now also with hemothorax.  Initially, we will proceed with placement  of a large-bore right chest tube to drain the hemothorax, hold  anticoagulation at this point, and follow serial hematocrits, chest x-  ray, and chest tube output.      Sheliah Plane, MD  Electronically Signed     EG/MEDQ  D:  11/18/2007  T:  11/18/2007  Job:  [id number]   cc:   Ileana Roup, M.D.

## 2010-08-31 NOTE — Op Note (Signed)
NAME:  [name] [name], [name] [name]               ACCOUNT NO.:  [id number]   MEDICAL RECORD NO.:  [id number]          PATIENT TYPE:  INP   LOCATION:  3307                         FACILITY:  [facility]   PHYSICIAN:  Sheliah Plane, MD    DATE OF BIRTH:  [birth year]   DATE OF PROCEDURE:  11/21/2007  DATE OF DISCHARGE:                               OPERATIVE REPORT   PREOPERATIVE DIAGNOSIS:  Right hemothorax.   POSTOPERATIVE DIAGNOSIS:  Right hemothorax.   SURGICAL PROCEDURES:  Right minithoracotomy, evacuation of hematoma, and  drainage of  subpleural hematoma and placement of On-Q device.   SURGEON:  Sheliah Plane, MD   FIRST ASSISTANT:  Theda Belfast, [place]   BRIEF HISTORY:  The patient is a 30 year old male with known  hypercoagulable state who was to have been on chronic Coumadin therapy  redeveloped thrombophlebitis in his leg, was started on Lovenox, and  restarted on Coumadin.  Subsequently, developed increasing shortness of  breath, rapidly increasing effusion in the right chest.  CT scan showed  subpleural bleed and evidence of pulmonary emboli.  A caval filter was  placed acutely on the patient's admission.  Anticoagulation was held.  Thoracic surgery was consulted.  A right chest tube was placed  initially.  However, the patient had continued evidence of clotted  hemothorax and a large component of subpleural bleed.  Thoracic surgery  was recommended to the patient to evacuate the hematoma.  No definite  source of bleeding could be found.   DESCRIPTION OF PROCEDURE:  The patient underwent general endotracheal  anesthesia with a double-lumen endotracheal tube.  He was turned in a  lateral decubitus position with the right side up.  Initially, a small  incision was made and a videoscope was attempted to be passed into the  chest.  Because of amount of clot, this was not useful.  A small right  thoracotomy was performed.  Tuffier retractor was placed between the  ribs.  This gave good  exposure to a large amount of old clotted blood  both in the pleural space and also a tent.  Posterior bulge of the  parietal pleura with underlying hematoma partially liquefied.  The old  clotted blood was all removed both from the thoracic cavity and from the  subpleural site.  Careful inspection of the area for any source of  bleeding such as undetected malignancy, no tumors, no definite source of  bleeding could be located.  The chest was well irrigated.  Three chest  tubes were left in place, 3 #36 chest tubes and anterior and a posterior  tube and a right-angle middle tube low in the chest.  The #2 Vicryl  sutures were used as intercostal sutures.  Because of extensive hematoma  posterior along the intercostal nerves was not felt to the On-Q device  placer would be effective; however, to help pain control, we did place  On-Q device in the deep part of the incision along the intercostal  nerves.  The muscle layers were closed with 0 Vicryl figure-of-eight pop-  off.  Subcutaneous tissue closed with a running 2-0 Vicryl  and a 3-0  subcutaneous tissue and the skin edges.  Dry dressings were applied.  The patient was awakened and was left  intubated because of this shortness of breath preoperatively and  transferred directly to the Surgical Intensive Care Unit for further  postoperative care.  Estimated blood loss was difficult as a significant  portion of the blood removed was old hematoma from the chest.      Sheliah Plane, MD  Electronically Signed     EG/MEDQ  D:  11/23/2007  T:  11/24/2007  Job:  [id number]   cc:   Ileana Roup, M.D.

## 2010-08-31 NOTE — Consult Note (Signed)
NAME:  [name] [name]               ACCOUNT NO.:  [id number]   MEDICAL RECORD NO.:  [id number]          PATIENT TYPE:  INP   LOCATION:  2101                         FACILITY:  [facility]   PHYSICIAN:  Oretha Milch, MD      DATE OF BIRTH:  [birth year]   DATE OF CONSULTATION:  11/17/2007  DATE OF DISCHARGE:                                 CONSULTATION   REFERRING PHYSICIAN:  Armstead Peaks, MD from Internal Medicine Teaching  Services.   REASON FOR CONSULTATION:  Right hemothorax, pulmonary embolism with  infarct.   HISTORY OF PRESENT ILLNESS:  [name] [name] is a 30 year old Caucasian  gentleman with a history of recurrent lower extremity DVTs.  His first  episode was a left lower extremity DVT that developed when he was  incarcerated about six years ago.  He was maintained on Coumadin for six  months and then released.  He subsequently has had recurrent problems  with DVTs.  He had hospitalization for a left lower extremity DVT again  in January 2008 and was on Coumadin again intermittently for some time.  I do note that his INR was therapeutic to 3.4 in March 2009.  I am not  sure after reviewing e-chart whether he has had a hypercoagulable workup  in the past or not.  I have been informed by the Internal Medicine  Teaching Service that he did have a low protein C level and positive  lupus anticoagulant; however, this information is not available to me  from e-chart.  He had an ER visit on November 06, 2007, and was found to  have a right lower extremity DVT based on the ER reports.  On the  subsequent visit on November 12, 2007, his INR was 1.2 and he had had  problems obtaining his Lovenox.  He was asked to follow up at [facility]; however, he had not been able to obtain his  Lovenox because this was too expensive.  After his ER visit on July 27,  he obtained the Lovenox and has started taking it; however, he developed  chest pain about a week ago.  About one day prior to  admission, this  chest pain changed in nature and became exquisitely tender and worsening  with respiration, which brought him to the emergency room again today.  A CT scan was done and was reviewed with the radiologist.  This showed  bilateral lower lobe pulmonary emboli with associated pulmonary infarct  and atelectases in both lower lungs.  A moderate amount of right pleural  fluid was noted to be layering and was likely a hemothorax based on  horns-filled units.  A 4 x 10 and 8 x 10 cm posterior right extrapleural  hematoma was identified.  Within this hematoma, there was an area of  high attenuation suspicious for arterial extravasation.  Tiny left  pleural effusion was also noted, hence we were consulted.   Past medical history includes hairline fracture involving the left lower  extremity many years ago.  Recurrent DVTs as described above.   PAST SURGICAL HISTORY:  None.  ALLERGIES:  None.   SOCIAL HISTORY:  He started smoking at the age of 30 and smokes about a  pack of cigarettes a day.  He lives with his father and has a 32-month-  old son.  He drinks alcohol rarely.  He has abused ecstasy in the past  and has been incarcerated for unclear reasons in the past.   FAMILY HISTORY:  No history of recurrent DVTs in his other family  members.   REVIEW OF SYSTEMS:  He reports exquisite right-sided chest pain that  worsens with inspiration, occasional episode of dyspnea related to this,  denies hemoptysis, cough, or fevers.   PHYSICAL EXAMINATION:  GENERAL:  Adult gentleman with exquisite right-  sided chest pain and episodic anxiety with worsening of his breathing.  VITAL SIGNS:  Heart rate 84 per minute sinus on monitor, blood pressure  134/88, oxygen saturation 98% on 2 L nasal cannula.  HEENT:  Pupils 3 mm and reactive to light.  No pallor.  No icterus.  NECK:  Supple.  No JVD, no lymphadenopathy.  CVS:  S1 and S2 normal.  No murmur.  CHEST:  Decreased breath sounds at the  right base, exquisite tenderness  in the right lower chest and right loin.  ABDOMEN:  Soft, nontender.  No organomegaly.  NEUROLOGIC:  Nonfocal.  EXTREMITIES:  No edema.   LABORATORY DATA:  WBC count 10.4, hemoglobin 15.4, platelets 386, 75%  neutrophils, BUN/creatinine 13.0/1.1, glucose 125, sodium 136, potassium  4.3, ionized calcium 1.13, PTT was 45, and INR was 1.8.  Review of his  other blood work shows a negative HIV in July 2009, a homocystine level  was documented as 8 (normal), and ANA was negative.  A hypercoagulable  panel in July 2009 shows an antithrombin III of 125 (75-120), negative  factor 5 mutation, negative prothrombin 2 gene mutation, protein C level  of 30 (normal levels being 70-140).  Lupus anticoagulant was positive  and homocystine level again of 9.4.   IMPRESSION:  1. Bilateral lower lobe pulmonary emboli.  2. Positive lupus anticoagulant and decreased protein C level.  3. Likely right hemothorax.  4. Right chest wall hematoma as described.  These films were reviewed      with the radiologist.  I think the CT appearance is quite      convincing for hemothorax.  Unfortunately, I do not think that      thoracentesis can be performed because he does have a hematoma in      his chest wall.  The question is whether he is actively bleeding      into this pleural space and chest wall or not.  Currently, his      hemoglobin appears to be stable.   RECOMMENDATIONS:  1. Serial hemoglobins q.6h. and repeat chest x-ray in 12 hours to see      whether he has bleeding into the pleural space, is worsening or      not.  2. Given the active bleeding on Lovenox and Coumadin, I would suggest      that we proceed with an IVC filter.  Strictly speaking, this does      not quantify the failure of Coumadin.  After reconstructing the      events, it seems that he may have had a pulmonary embolism prior;      however, the pulmonary infarct and worsening seems to be over the       last 24-48 hours duration.  He seems to  be tolerating this      hemodynamically and oxygenation wise quite well; however, I am      afraid that with further anticoagulation, he may acutely bleed into      his pleural space and chest wall.  This decision was arrived at      after extensive discussion with the patient, with the medical team,      and the radiologists.  3. If the chest x-ray appears worse in 12 hours, we can rescan him and      decide whether a chest tube is needed.  4. I would agree with ICU admission for the next 12-24 hours, Dilaudid      1-2 mg IV for pain, and Ativan 0.5 to 1 mg IV for anxiety as      needed.  5. When we are convinced that the bleeding has stopped,      anticoagulation can be resumed.  Given the hypercoagulable state,      target INR will have to be higher.   Thank you Dr. Yetta Barre for involving [place] in the care of this patient.  We  will assist with his management during his hospital stay and after.      Oretha Milch, MD  Electronically Signed     RVA/MEDQ  D:  11/17/2007  T:  11/18/2007  Job:  [id number]   cc:   Armstead Peaks, M.D.

## 2010-08-31 NOTE — Assessment & Plan Note (Signed)
OFFICE VISIT   Babson, Koray T  DOB:  [birth year]                                        December 07, 2009  CHART #:  [id number]   HISTORY OF PRESENT ILLNESS:  The patient returns for follow up of left  spontaneous pneumothorax.  He presented on November 17, 2009, with a  spontaneous pneumothorax that required chest tube placement.  The  pneumothorax resolved following chest tube placement, and he was  ultimately discharged home uneventfully.  Since then, he has done well.  He reports no problems with pain or shortness of breath.  He feels back  to normal and has no complaints at all.  He has never had any previous  episodes of spontaneous pneumothorax in the past.  He is taking his  Coumadin regularly and getting his Coumadin dose adjusted as need be.  Unfortunately, he has gone back to smoking cigarettes.   PHYSICAL EXAMINATION:  Notable for well-appearing young male with blood  pressure 132/72, pulse 77 and regular, oxygen saturation 95% on room  air.  Auscultation of the chest reveals clear breath sounds that are  symmetrical bilaterally.  The chest tube site has healed completely.  The remainder of his physical exam is noncontributory.   DIAGNOSTIC TESTS:  Chest x-ray obtained today at the [facility] is reviewed.  This demonstrates clear lung fields bilaterally.  There is no pneumothorax.   IMPRESSION:  Resolution of first-time left spontaneous pneumothorax.  The patient is doing well.  Unfortunately, he has gone back to smoking  cigarettes.   PLAN:  In the future, the patient will call and return to see [place] as  needed.  All of his questions have been addressed.  He has been  encouraged to find a way to quit smoking.   Salvatore Decent. Cornelius Moras, M.D.  Electronically Signed   CHO/MEDQ  D:  12/07/2009  T:  12/08/2009  Job:  [id number]

## 2010-09-03 NOTE — Discharge Summary (Signed)
NAME:  [name] [name], [name] [name]               ACCOUNT NO.:  [id number]   MEDICAL RECORD NO.:  [id number]          PATIENT TYPE:  INP   LOCATION:  1434                         FACILITY:  [facility]   PHYSICIAN:  Michaelyn Barter, M.D. DATE OF BIRTH:  [birth year]   DATE OF ADMISSION:  05/05/2006  DATE OF DISCHARGE:  05/09/2006                               DISCHARGE SUMMARY   FINAL DIAGNOSES:  1. Left leg deep venous thrombosis.  2. Cellulitis of the left leg.   The patient's primary care physician is unassigned.   HISTORY OF PRESENT ILLNESS:  [name] [name] is a 30 year old gentleman  with a past medical history of left leg DVT.  He states that  approximately 1 month ago his left leg began to swell.  The swelling  would decrease but reoccur off and on.  A small knot appeared to develop  behind the calf of his left knee.  Just prior to this hospitalization,  he began to develop redness over the medial aspect of the left lower  extremity. He initially presented to [facility] a  few weeks prior to this admission and received a 5-day course of Keflex.  There was no symptom improvement. The leg continued to swell and he went  on to develop pain within the left leg.  Therefore, he came to the  hospital for further evaluation.   PAST MEDICAL HISTORY:  Please see that dictated by Dr. Michaelyn Barter  on May 05, 2006.   HOSPITAL COURSE:  1. Acute left leg DVT. While in the emergency department, a venous      duplex of the left lower extremity was completed.  It revealed a      positive DVT coursing to PTV to distal common femoral vein; the      greater saphenous vein was thrombosed from the calf to the medial      thigh. Lovenox was initiated followed by Coumadin.  The patient      also received p.r.n. pain medications.  He stated that his pain      declined over the course of his hospitalization.  With regards to      the swelling in the left leg, that also improved over the  course of      the patient's hospitalization.  2. Cellulitis of the left leg.  The patient was started on vancomycin      empirically.  Blood cultures were ordered; the results of which      were reported to be negative. The patient has been on vancomycin      throughout the course of his hospitalization.   CONDITION ON THE DATE OF DISCHARGE:  The patient indicated that he felt  much better.  He stated that his left leg swelling had decreased  significantly.  He has not had any complaints of shortness of breath or  chest pain throughout the course of hospitalization. The decision has  been made to discharge the patient from the hospital.  On the date of  discharge, the patient's vitals:  His temperature was 97.5, heart rate  63, respirations 20,  blood pressure 128/78, and O2 sat 98% on room air.  The decision has been made to discharge the patient from the hospital.  His white blood cell count was 8.9, hemoglobin 16.1, hematocrit 45.4,  platelet count 270.  His PT was 15.3.  INR 1.2, PTT 39.  His sodium was  139, potassium 4.0, chloride 105, CO2 27, glucose 96, BUN 10, creatinine  0.85, and calcium 9.5.   The patient's medications at the time of discharge consist of the  following:  1. Coumadin 5 mg.  The patient will be instructed to take 3 tablets      today followed by 2 tablets p.o. daily until he      is seen by the physicians at [facility].  2. Levofloxacin 500 mg 1 tablet p.o. daily. He will be instructed to      have his PT and INR checked within the next 2 to 3 days. In      addition, he will also be given a prescription for pain.      Michaelyn Barter, M.D.  Electronically Signed     OR/MEDQ  D:  05/09/2006  T:  05/09/2006  Job:  [id number]

## 2010-09-03 NOTE — H&P (Signed)
NAME:  [name] [name], [name] [name]               ACCOUNT NO.:  [id number]   MEDICAL RECORD NO.:  [id number]          PATIENT TYPE:  INP   LOCATION:  0102                         FACILITY:  [facility]   PHYSICIAN:  Michaelyn Barter, M.D. DATE OF BIRTH:  [birth year]   DATE OF ADMISSION:  05/05/2006  DATE OF DISCHARGE:                              HISTORY & PHYSICAL   CHIEF COMPLAINT:  Left leg pain and swelling.   HISTORY OF PRESENT ILLNESS:  [name] [name] is a 30 year old gentleman  with a past medical history of left leg DVT.  He states that  approximately one month ago, his left leg began to swell.  The swelling  would decrease off and on over the course of the past month.  He began  to notice the appearance of knots on his calf muscle behind his left  knee.  He has since developed some redness on his left leg.  He was seen  in [facility] a couple of weeks ago at which time  he received a prescription for Keflex and was discharged home.  He  completed a five day course of the Keflex, however, there was no  improvement in his symptoms.  Since that time, the swelling has  increased and, over the last two days, the swelling has started to  decrease.  He has had pain within his left leg for at least a month.  Today, the pain intensified.  He has a friend who became sick with a  separate illness.  The friend came to the hospital for evaluation.  He  decided to come along to support his friend and requested to be worked  up while in the hospital.  While in the ER, he underwent a left venous  duplex and it was found to be positive for DVT.  He denies having any  nausea, vomiting, fevers or chills.   PAST MEDICAL HISTORY:  1. Hairline fracture involving the left leg several years ago, the      patient did not receive any treatment secondary to this.  2. DVT of the left leg diagnosed approximately 4 1/2 years ago while      the patient was incarcerated at [facility]  At  that      time, he was treated with Lovenox followed by Coumadin.  The      patient took the Coumadin for at least six months.  He stopped the      Coumadin once he was released from prison.   PAST SURGICAL HISTORY:  None.   ALLERGIES:  None.   FAMILY HISTORY:  Mother has no illness.  The patient does not know who  is father is.   SOCIAL HISTORY:  The patient started smoking at the age of 47.  He  smokes one pack cigarettes per day.  Alcohol rarely.  The patient denies  any street drugs.   REVIEW OF SYMPTOMS:  No nausea, vomiting, fevers, or chills.  No  shortness of breath.  No chest pain.  No bowel movement changes.  No  dysuria or hematuria.   PHYSICAL EXAMINATION:  GENERAL:  The patient is awake, he is in no  obvious respiratory distress.  VITAL SIGNS:  Temperature 97.9, blood pressure 136/81, heart rate 83,  respirations 16, O2 saturation 96% on room air.  HEENT:  Normocephalic, atraumatic.  Anicteric, extraocular movements  intact.  Oral mucosa is pink, moist, no thrush, no exudates.  NECK:  Supple, no lymphadenopathy, thyroid not palpable.  CARDIAC:  S1 and S2 present, regular rate and rhythm.  RESPIRATORY:  No crackles or wheezes.  ABDOMEN:  Soft, nontender, nondistended, bowel sounds are present x4  quadrants, no masses palpable.  EXTREMITIES:  The left leg is swollen distal to the knee all the way  down to the ankle.  The medial aspect of the leg is erythematous.  His  calf has multiple areas of induration and this area is tender to  palpation.  The patient had strong dorsal pedal pulses present.  NEUROLOGICAL:  The patient is alert and oriented x 3.  MUSCULOSKELETAL:  5/5 upper extremity strength, 5/5 right leg strength,  4/5 left leg strength.  The patient's strength is compromised secondary  to pain.   LABORATORY DATA:  White blood cell count 9.2, hemoglobin 16.1,  hematocrit 46.5, platelets 290.  PT 13, INR 1.  D-dimer 1.45. Sodium  139, potassium 4.2, chloride  107, CO2 27, BUN 11, creatinine 0.8,  glucose 90.  Left venous duplex was completed in the ER and is positive  for DVT coursing in the PTV to distal CFV.  The greater saphenous vein  is thrombosed from the calf to the mid thigh.   ASSESSMENT/PLAN:  1. Acute left leg DVT.  The patient has received Lovenox in the ER.      We will continue Lovenox.  We will also initiate Coumadin therapy.      We will provide the patient with p.r.n. pain medication.  We will      check the patient's coagulation studies.  2. Cellulitis of the lower left leg.  We will start the patient on      empiric IV antibiotics.  We will check blood cultures x2.  3. GI prophylaxis.  We will provide Protonix.      Michaelyn Barter, M.D.  Electronically Signed     OR/MEDQ  D:  05/05/2006  T:  05/05/2006  Job:  [id number]

## 2010-09-07 ENCOUNTER — Emergency Department (HOSPITAL_COMMUNITY)
Admission: EM | Admit: 2010-09-07 | Discharge: 2010-09-08 | Disposition: A | Discharge status: Home / Self Care | Payer: BC Managed Care – PPO | Attending: Emergency Medicine | Admitting: Emergency Medicine

## 2010-09-07 DIAGNOSIS — R509 Fever, unspecified: Secondary | ICD-10-CM | POA: Insufficient documentation

## 2010-09-07 DIAGNOSIS — K029 Dental caries, unspecified: Secondary | ICD-10-CM | POA: Insufficient documentation

## 2010-09-07 DIAGNOSIS — R220 Localized swelling, mass and lump, head: Secondary | ICD-10-CM | POA: Insufficient documentation

## 2010-09-07 DIAGNOSIS — Z7901 Long term (current) use of anticoagulants: Secondary | ICD-10-CM | POA: Insufficient documentation

## 2010-09-07 DIAGNOSIS — R221 Localized swelling, mass and lump, neck: Secondary | ICD-10-CM | POA: Insufficient documentation

## 2010-09-07 DIAGNOSIS — K089 Disorder of teeth and supporting structures, unspecified: Secondary | ICD-10-CM | POA: Insufficient documentation

## 2010-09-07 DIAGNOSIS — K047 Periapical abscess without sinus: Secondary | ICD-10-CM | POA: Insufficient documentation

## 2010-09-07 DIAGNOSIS — Z86718 Personal history of other venous thrombosis and embolism: Secondary | ICD-10-CM | POA: Insufficient documentation

## 2010-09-07 DIAGNOSIS — R599 Enlarged lymph nodes, unspecified: Secondary | ICD-10-CM | POA: Insufficient documentation

## 2010-09-14 ENCOUNTER — Ambulatory Visit (INDEPENDENT_AMBULATORY_CARE_PROVIDER_SITE_OTHER): Payer: BC Managed Care – PPO | Admitting: Pharmacist

## 2010-09-14 DIAGNOSIS — I82409 Acute embolism and thrombosis of unspecified deep veins of unspecified lower extremity: Secondary | ICD-10-CM

## 2010-09-14 DIAGNOSIS — D6859 Other primary thrombophilia: Secondary | ICD-10-CM

## 2010-09-14 DIAGNOSIS — Z7901 Long term (current) use of anticoagulants: Secondary | ICD-10-CM

## 2010-09-14 LAB — POCT INR: INR: 1.7

## 2010-09-14 NOTE — Progress Notes (Signed)
Anti-Coagulation Progress Note  [name] [name] is a 30 y.o. male who is currently on an anti-coagulation regimen.    RECENT RESULTS: Recent results are below, the most recent result is correlated with a dose of 160 mg. per week: Lab Results  Component Value Date   INR 1.7 09/14/2010   INR 3.7 08/30/2010   INR 1.5 08/23/2010    ANTI-COAG DOSE:   Latest dosing instructions   Total [name] Tue Wed Thu Fri Sat   170 25 mg 20 mg 25 mg 25 mg 25 mg 25 mg 25 mg    (10 mg2.5) (10 mg2) (10 mg2.5) (10 mg2.5) (10 mg2.5) (10 mg2.5) (10 mg2.5)         ANTICOAG SUMMARY: Anticoagulation Episode Summary              Current INR goal 2.0-3.0 Next INR check 09/28/2010   INR from last check 1.7! (09/14/2010)     Weekly max dose (mg)  Target end date Indefinite   Indications PRIMARY HYPERCOAGULABLE STATE, DVT, Long term current use of anticoagulant   INR check location [facility] Preferred lab    Send INR reminders to [facility]   Comments        Provider Role Specialty Phone number   Blanch Media  Internal Medicine [phone]        ANTICOAG TODAY: Anticoagulation Summary as of 09/14/2010              INR goal 2.0-3.0     Selected INR 1.7! (09/14/2010) Next INR check 09/28/2010   Weekly max dose (mg)  Target end date Indefinite   Indications PRIMARY HYPERCOAGULABLE STATE, DVT, Long term current use of anticoagulant    Anticoagulation Episode Summary              INR check location [facility] Preferred lab    Send INR reminders to [facility]   Comments        Provider Role Specialty Phone number   Blanch Media  Internal Medicine [phone]        PATIENT INSTRUCTIONS: Patient Instructions  Patient instructed to take medications as defined in the Anti-coagulation Track section of this encounter.  Patient instructed to take today's dose.  Patient verbalized understanding of these instructions.        FOLLOW-UP Return in 2 weeks (on 09/28/2010)  for Follow up INR.  [name], III Pharm.D., CACP

## 2010-09-14 NOTE — Patient Instructions (Signed)
Patient instructed to take medications as defined in the Anti-coagulation Track section of this encounter.  Patient instructed to take today's dose.  Patient verbalized understanding of these instructions.    

## 2010-10-25 ENCOUNTER — Ambulatory Visit: Payer: BC Managed Care – PPO

## 2011-01-10 LAB — PROTIME-INR: Prothrombin Time: 35.8 — ABNORMAL HIGH

## 2011-01-14 LAB — COMPREHENSIVE METABOLIC PANEL
ALT: 103 — ABNORMAL HIGH
ALT: 156 — ABNORMAL HIGH
ALT: 166 — ABNORMAL HIGH
ALT: 175 — ABNORMAL HIGH
ALT: 41
ALT: 57 — ABNORMAL HIGH
ALT: 60 — ABNORMAL HIGH
AST: 22
AST: 25
AST: 25
AST: 32
AST: 68 — ABNORMAL HIGH
AST: 78 — ABNORMAL HIGH
AST: 79 — ABNORMAL HIGH
AST: 83 — ABNORMAL HIGH
Albumin: 3.6
Albumin: 2.1 — ABNORMAL LOW
Albumin: 2.1 — ABNORMAL LOW
Albumin: 2.2 — ABNORMAL LOW
Albumin: 2.4 — ABNORMAL LOW
Albumin: 2.4 — ABNORMAL LOW
Albumin: 2.8 — ABNORMAL LOW
Albumin: 3.2 — ABNORMAL LOW
Albumin: 3.2 — ABNORMAL LOW
Alkaline Phosphatase: 84
Alkaline Phosphatase: 105
Alkaline Phosphatase: 114
Alkaline Phosphatase: 119 — ABNORMAL HIGH
Alkaline Phosphatase: 123 — ABNORMAL HIGH
Alkaline Phosphatase: 124 — ABNORMAL HIGH
Alkaline Phosphatase: 141 — ABNORMAL HIGH
Alkaline Phosphatase: 162 — ABNORMAL HIGH
Alkaline Phosphatase: 166 — ABNORMAL HIGH
Alkaline Phosphatase: 181 — ABNORMAL HIGH
Alkaline Phosphatase: 79
Alkaline Phosphatase: 89
BUN: 10
BUN: 10
BUN: 10
BUN: 18
BUN: 32 — ABNORMAL HIGH
BUN: 6
BUN: 7
BUN: 8
BUN: 8
CO2: 27
CO2: 27
CO2: 27
CO2: 28
CO2: 28
CO2: 29
CO2: 29
CO2: 30
CO2: 30
Calcium: 8.5
Calcium: 8.6
Calcium: 8.7
Calcium: 8.9
Calcium: 9
Calcium: 9.2
Calcium: 9.3
Chloride: 107
Chloride: 96
Chloride: 96
Chloride: 96
Chloride: 97
Chloride: 97
Chloride: 97
Chloride: 99
Creatinine, Ser: 0.6
Creatinine, Ser: 0.65
Creatinine, Ser: 0.67
Creatinine, Ser: 0.7
Creatinine, Ser: 0.7
Creatinine, Ser: 0.75
Creatinine, Ser: 0.75
Creatinine, Ser: 0.78
GFR calc Af Amer: >60
GFR calc Af Amer: >60
GFR calc Af Amer: >60
GFR calc Af Amer: >60
GFR calc Af Amer: >60
GFR calc Af Amer: >60
GFR calc Af Amer: >60
GFR calc Af Amer: >60
GFR calc non Af Amer: 58 — ABNORMAL LOW
GFR calc non Af Amer: >60
GFR calc non Af Amer: >60
GFR calc non Af Amer: >60
GFR calc non Af Amer: >60
GFR calc non Af Amer: >60
GFR calc non Af Amer: >60
GFR calc non Af Amer: >60
GFR calc non Af Amer: >60
Glucose, Bld: 103 — ABNORMAL HIGH
Glucose, Bld: 103 — ABNORMAL HIGH
Glucose, Bld: 104 — ABNORMAL HIGH
Glucose, Bld: 106 — ABNORMAL HIGH
Glucose, Bld: 115 — ABNORMAL HIGH
Glucose, Bld: 128 — ABNORMAL HIGH
Glucose, Bld: 149 — ABNORMAL HIGH
Glucose, Bld: 150 — ABNORMAL HIGH
Glucose, Bld: 165 — ABNORMAL HIGH
Glucose, Bld: 95
Potassium: 4
Potassium: 3.6
Potassium: 3.6
Potassium: 3.6
Potassium: 4
Potassium: 4.1
Potassium: 4.2
Potassium: 4.3
Potassium: 4.4
Potassium: 4.9
Potassium: 4.9
Potassium: 5.3 — ABNORMAL HIGH
Potassium: 5.5 — ABNORMAL HIGH
Sodium: 132 — ABNORMAL LOW
Sodium: 134 — ABNORMAL LOW
Sodium: 134 — ABNORMAL LOW
Sodium: 134 — ABNORMAL LOW
Sodium: 134 — ABNORMAL LOW
Sodium: 135
Sodium: 139
Total Bilirubin: 0.7
Total Bilirubin: 0.5
Total Bilirubin: 0.5
Total Bilirubin: 0.6
Total Bilirubin: 0.8
Total Bilirubin: 0.9
Total Bilirubin: 1.1
Total Bilirubin: 1.8 — ABNORMAL HIGH
Total Protein: 5.6 — ABNORMAL LOW
Total Protein: 5.6 — ABNORMAL LOW
Total Protein: 5.8 — ABNORMAL LOW
Total Protein: 5.8 — ABNORMAL LOW
Total Protein: 6.4
Total Protein: 6.5
Total Protein: 6.5
Total Protein: 7
Total Protein: 7.1
Total Protein: 7.4

## 2011-01-14 LAB — CBC
HCT: 44.6
HCT: 23.1 — ABNORMAL LOW
HCT: 24.6 — ABNORMAL LOW
HCT: 25.3 — ABNORMAL LOW
HCT: 25.4 — ABNORMAL LOW
HCT: 25.5 — ABNORMAL LOW
HCT: 25.5 — ABNORMAL LOW
HCT: 25.8 — ABNORMAL LOW
HCT: 25.8 — ABNORMAL LOW
HCT: 25.9 — ABNORMAL LOW
HCT: 25.9 — ABNORMAL LOW
HCT: 26.1 — ABNORMAL LOW
HCT: 26.3 — ABNORMAL LOW
HCT: 26.3 — ABNORMAL LOW
HCT: 26.6 — ABNORMAL LOW
HCT: 27.2 — ABNORMAL LOW
HCT: 27.8 — ABNORMAL LOW
HCT: 36.3 — ABNORMAL LOW
HCT: 41.3
HCT: 45.1
Hemoglobin: 15.4
Hemoglobin: 10.6 — ABNORMAL LOW
Hemoglobin: 12.4 — ABNORMAL LOW
Hemoglobin: 8 — ABNORMAL LOW
Hemoglobin: 8.1 — ABNORMAL LOW
Hemoglobin: 8.3 — ABNORMAL LOW
Hemoglobin: 8.7 — ABNORMAL LOW
Hemoglobin: 8.8 — ABNORMAL LOW
Hemoglobin: 8.8 — ABNORMAL LOW
Hemoglobin: 8.8 — ABNORMAL LOW
Hemoglobin: 8.8 — ABNORMAL LOW
Hemoglobin: 8.9 — ABNORMAL LOW
Hemoglobin: 9 — ABNORMAL LOW
Hemoglobin: 9 — ABNORMAL LOW
Hemoglobin: 9 — ABNORMAL LOW
Hemoglobin: 9 — ABNORMAL LOW
Hemoglobin: 9.1 — ABNORMAL LOW
Hemoglobin: 9.2 — ABNORMAL LOW
Hemoglobin: 9.4 — ABNORMAL LOW
Hemoglobin: 9.5 — ABNORMAL LOW
MCHC: 33.9
MCHC: 33.9
MCHC: 34.1
MCHC: 34.6
MCHC: 33.8
MCHC: 33.9
MCHC: 34
MCHC: 34
MCHC: 34
MCHC: 34
MCHC: 34.1
MCHC: 34.1
MCHC: 34.2
MCHC: 34.2
MCHC: 34.2
MCHC: 34.3
MCHC: 34.3
MCHC: 34.5
MCHC: 34.5
MCHC: 34.7
MCHC: 34.8
MCHC: 34.8
MCHC: 34.9
MCHC: 35.1
MCHC: 35.1
MCHC: 35.7
MCV: 92.6
MCV: 92.6
MCV: 93.4
MCV: 93.4
MCV: 91.3
MCV: 91.9
MCV: 92.2
MCV: 92.2
MCV: 92.4
MCV: 92.4
MCV: 92.8
MCV: 93
MCV: 93.1
MCV: 93.2
MCV: 93.5
MCV: 93.5
MCV: 93.6
MCV: 93.6
MCV: 93.7
MCV: 93.7
MCV: 94.1
MCV: 94.3
MCV: 94.4
Platelets: 218
Platelets: 227
Platelets: 280
Platelets: 350
Platelets: 363
Platelets: 365
Platelets: 365
Platelets: 383
Platelets: 384
Platelets: 385
Platelets: 385
Platelets: 386
Platelets: 390
Platelets: 391
Platelets: 400
Platelets: 400
Platelets: 407 — ABNORMAL HIGH
Platelets: 417 — ABNORMAL HIGH
Platelets: 454 — ABNORMAL HIGH
Platelets: 465 — ABNORMAL HIGH
RBC: 4.88
RBC: 5.09
RBC: 2.62 — ABNORMAL LOW
RBC: 2.73 — ABNORMAL LOW
RBC: 2.74 — ABNORMAL LOW
RBC: 2.75 — ABNORMAL LOW
RBC: 2.78 — ABNORMAL LOW
RBC: 2.79 — ABNORMAL LOW
RBC: 2.79 — ABNORMAL LOW
RBC: 2.8 — ABNORMAL LOW
RBC: 2.8 — ABNORMAL LOW
RBC: 2.82 — ABNORMAL LOW
RBC: 2.85 — ABNORMAL LOW
RBC: 2.89 — ABNORMAL LOW
RBC: 2.9 — ABNORMAL LOW
RBC: 2.91 — ABNORMAL LOW
RBC: 2.91 — ABNORMAL LOW
RBC: 2.96 — ABNORMAL LOW
RBC: 2.98 — ABNORMAL LOW
RBC: 3.9 — ABNORMAL LOW
RBC: 4.05 — ABNORMAL LOW
RBC: 4.4
RBC: 4.82
RDW: 12.8
RDW: 13.1
RDW: 13
RDW: 13
RDW: 13.1
RDW: 13.1
RDW: 13.1
RDW: 13.2
RDW: 13.2
RDW: 13.3
RDW: 13.3
RDW: 13.4
RDW: 13.5
RDW: 13.6
RDW: 13.6
RDW: 13.6
RDW: 13.7
RDW: 13.8
RDW: 13.8
RDW: 13.9
WBC: 13.1 — ABNORMAL HIGH
WBC: 14 — ABNORMAL HIGH
WBC: 10.4
WBC: 13.1 — ABNORMAL HIGH
WBC: 14 — ABNORMAL HIGH
WBC: 14.2 — ABNORMAL HIGH
WBC: 14.4 — ABNORMAL HIGH
WBC: 14.6 — ABNORMAL HIGH
WBC: 14.7 — ABNORMAL HIGH
WBC: 15 — ABNORMAL HIGH
WBC: 15.5 — ABNORMAL HIGH
WBC: 15.5 — ABNORMAL HIGH
WBC: 15.9 — ABNORMAL HIGH
WBC: 16.5 — ABNORMAL HIGH
WBC: 16.5 — ABNORMAL HIGH
WBC: 16.7 — ABNORMAL HIGH
WBC: 17 — ABNORMAL HIGH
WBC: 19.5 — ABNORMAL HIGH
WBC: 19.7 — ABNORMAL HIGH
WBC: 20.3 — ABNORMAL HIGH
WBC: 27.3 — ABNORMAL HIGH

## 2011-01-14 LAB — PROTIME-INR
INR: 1
INR: 1.2
INR: 1.4
INR: 1.8 — ABNORMAL HIGH
INR: 1.1
INR: 1.2
INR: 1.2
INR: 1.3
INR: 1.4
INR: 1.4
INR: 2.4 — ABNORMAL HIGH
Prothrombin Time: 13.3
Prothrombin Time: 17.7 — ABNORMAL HIGH
Prothrombin Time: 19.4 — ABNORMAL HIGH
Prothrombin Time: 21.5 — ABNORMAL HIGH
Prothrombin Time: 15.1
Prothrombin Time: 15.4 — ABNORMAL HIGH
Prothrombin Time: 15.4 — ABNORMAL HIGH
Prothrombin Time: 16.3 — ABNORMAL HIGH
Prothrombin Time: 16.9 — ABNORMAL HIGH
Prothrombin Time: 18 — ABNORMAL HIGH
Prothrombin Time: 21.9 — ABNORMAL HIGH
Prothrombin Time: 23.6 — ABNORMAL HIGH

## 2011-01-14 LAB — BASIC METABOLIC PANEL
BUN: 10
BUN: 12
CO2: 27
CO2: 28
CO2: 31
Calcium: 8.5
Calcium: 8.6
Chloride: 103
Chloride: 100
Chloride: 97
Chloride: 98
Creatinine, Ser: 0.9
Creatinine, Ser: 0.59
Creatinine, Ser: 0.66
Creatinine, Ser: 0.66
GFR calc Af Amer: >60
GFR calc Af Amer: >60
GFR calc Af Amer: >60
GFR calc Af Amer: >60
GFR calc non Af Amer: >60
GFR calc non Af Amer: >60
GFR calc non Af Amer: >60
Glucose, Bld: 120 — ABNORMAL HIGH
Glucose, Bld: 126 — ABNORMAL HIGH
Potassium: 3.7
Potassium: 3.9
Potassium: 4
Sodium: 134 — ABNORMAL LOW
Sodium: 135
Sodium: 135

## 2011-01-14 LAB — POCT I-STAT, CHEM 8
BUN: 14
Calcium, Ion: 1.19
Calcium, Ion: 1.2
Calcium, Ion: 1.13
Chloride: 105
Chloride: 105
Creatinine, Ser: 1.1
Glucose, Bld: 103 — ABNORMAL HIGH
Glucose, Bld: 99
Glucose, Bld: 125 — ABNORMAL HIGH
HCT: 46
HCT: 47
Hemoglobin: 16
Potassium: 4.3
TCO2: 23
TCO2: 24
TCO2: 32

## 2011-01-14 LAB — DIFFERENTIAL
Basophils Absolute: 0
Basophils Relative: 0
Basophils Relative: 1
Eosinophils Absolute: 1 — ABNORMAL HIGH
Eosinophils Absolute: 0.4
Eosinophils Absolute: 0.4
Eosinophils Relative: 4
Lymphs Abs: 1.4
Lymphs Abs: 2.1
Monocytes Absolute: 0.8
Monocytes Absolute: 2.3 — ABNORMAL HIGH
Monocytes Relative: 9
Monocytes Relative: 12
Monocytes Relative: 8
Neutro Abs: 8.1 — ABNORMAL HIGH
Neutrophils Relative %: 65
Neutrophils Relative %: 75
Neutrophils Relative %: 76

## 2011-01-14 LAB — PREPARE FRESH FROZEN PLASMA

## 2011-01-14 LAB — AMYLASE, BODY FLUID: Amylase, Fluid: 31

## 2011-01-14 LAB — BLOOD GAS, ARTERIAL
Bicarbonate: 25 — ABNORMAL HIGH
Bicarbonate: 29.6 — ABNORMAL HIGH
Drawn by: 224301
O2 Content: 4
O2 Saturation: 93.3
Patient temperature: 98.6
TCO2: 26.1
pCO2 arterial: 38.1
pH, Arterial: 7.432

## 2011-01-14 LAB — ANTITHROMBIN III: AntiThromb III Func: 125 — ABNORMAL HIGH (ref 76–126)

## 2011-01-14 LAB — URINALYSIS, ROUTINE W REFLEX MICROSCOPIC
Bilirubin Urine: NEGATIVE
Glucose, UA: NEGATIVE
Glucose, UA: NEGATIVE
Ketones, ur: NEGATIVE
Leukocytes, UA: NEGATIVE
Nitrite: NEGATIVE
Nitrite: NEGATIVE
Protein, ur: NEGATIVE
Specific Gravity, Urine: >1.046 — ABNORMAL HIGH
pH: 6
pH: 5.5

## 2011-01-14 LAB — RAPID URINE DRUG SCREEN, HOSP PERFORMED
Benzodiazepines: NOT DETECTED
Cocaine: NOT DETECTED
Tetrahydrocannabinol: POSITIVE — AB
Tetrahydrocannabinol: POSITIVE — AB

## 2011-01-14 LAB — POCT I-STAT 3, ART BLOOD GAS (G3+)
O2 Saturation: 94
Patient temperature: 99.9
TCO2: 30
pCO2 arterial: 42.5
pCO2 arterial: 44.6
pH, Arterial: 7.417
pH, Arterial: 7.442

## 2011-01-14 LAB — CARDIOLIPIN ANTIBODIES, IGG, IGM, IGA
Anticardiolipin IgA: 34 (ref <13)
Anticardiolipin IgG: <7 — ABNORMAL LOW (ref <11)
Anticardiolipin IgM: 7 — ABNORMAL LOW (ref <10)

## 2011-01-14 LAB — BODY FLUID CULTURE: Culture: NO GROWTH

## 2011-01-14 LAB — WOUND CULTURE: Culture: NO GROWTH

## 2011-01-14 LAB — CMV IGM: CMV IgM: <0.9 Index (ref <0.90)

## 2011-01-14 LAB — APTT
aPTT: 36
aPTT: 35

## 2011-01-14 LAB — CROSSMATCH: ABO/RH(D): B POS

## 2011-01-14 LAB — BETA-2-GLYCOPROTEIN I ABS, IGG/M/A: Beta-2 Glyco I IgG: 10 U/mL (ref <20)

## 2011-01-14 LAB — HEPATITIS PANEL, ACUTE
HCV Ab: NEGATIVE
Hep A IgM: NEGATIVE

## 2011-01-14 LAB — LIPID PANEL
Cholesterol: 208 — ABNORMAL HIGH
LDL Cholesterol: 154 — ABNORMAL HIGH
Triglycerides: 107

## 2011-01-14 LAB — LUPUS ANTICOAGULANT PANEL
DRVVT: 69.2 — ABNORMAL HIGH (ref 36.1–47.0)
Lupus Anticoagulant: DETECTED — AB
PTTLA 4:1 Mix: 69.9 — ABNORMAL HIGH (ref 36.3–48.8)

## 2011-01-14 LAB — HEPATIC FUNCTION PANEL
ALT: 154 — ABNORMAL HIGH
Alkaline Phosphatase: 204 — ABNORMAL HIGH
Indirect Bilirubin: 0.7
Total Bilirubin: 1.1
Total Protein: 5.7 — ABNORMAL LOW

## 2011-01-14 LAB — ANA: Anti Nuclear Antibody(ANA): NEGATIVE

## 2011-01-14 LAB — GLUCOSE, CAPILLARY: Glucose-Capillary: 105 — ABNORMAL HIGH

## 2011-01-14 LAB — HOMOCYSTEINE: Homocysteine: 9.4

## 2011-01-14 LAB — HEPARIN LEVEL (UNFRACTIONATED): Heparin Unfractionated: 0.52

## 2011-01-14 LAB — HEMATOCRIT, BODY FLUID: Hematocrit, Fluid: 9.5

## 2011-01-14 LAB — ANAEROBIC CULTURE

## 2011-01-14 LAB — SODIUM, URINE, RANDOM: Sodium, Ur: <10

## 2011-01-14 LAB — LACTATE DEHYDROGENASE, PLEURAL OR PERITONEAL FLUID

## 2011-01-14 LAB — GRAM STAIN

## 2011-01-14 LAB — URINE MICROSCOPIC-ADD ON

## 2011-01-14 LAB — PROTEIN C, TOTAL: Protein C, Total: 30 % — ABNORMAL LOW (ref 70–140)

## 2011-01-14 LAB — TECHNOLOGIST SMEAR REVIEW

## 2011-01-14 LAB — ABO/RH: ABO/RH(D): B POS

## 2011-01-14 LAB — CREATININE, URINE, RANDOM: Creatinine, Urine: 278.5

## 2011-01-14 LAB — PROTEIN, BODY FLUID: Total protein, fluid: 6.5

## 2011-01-14 LAB — PROTHROMBIN GENE MUTATION

## 2011-01-24 ENCOUNTER — Ambulatory Visit: Payer: BC Managed Care – PPO

## 2011-01-25 ENCOUNTER — Emergency Department (HOSPITAL_COMMUNITY): Payer: Self-pay

## 2011-01-25 ENCOUNTER — Observation Stay (HOSPITAL_COMMUNITY)
Admission: EM | Admit: 2011-01-25 | Discharge: 2011-01-26 | Disposition: A | Discharge status: Home / Self Care | Payer: Self-pay | Attending: Infectious Diseases | Admitting: Infectious Diseases

## 2011-01-25 DIAGNOSIS — M79609 Pain in unspecified limb: Principal | ICD-10-CM | POA: Insufficient documentation

## 2011-01-25 DIAGNOSIS — M7989 Other specified soft tissue disorders: Secondary | ICD-10-CM | POA: Insufficient documentation

## 2011-01-25 DIAGNOSIS — I872 Venous insufficiency (chronic) (peripheral): Secondary | ICD-10-CM | POA: Insufficient documentation

## 2011-01-25 DIAGNOSIS — D6859 Other primary thrombophilia: Secondary | ICD-10-CM

## 2011-01-25 DIAGNOSIS — Z7901 Long term (current) use of anticoagulants: Secondary | ICD-10-CM | POA: Insufficient documentation

## 2011-01-26 ENCOUNTER — Encounter (HOSPITAL_COMMUNITY): Payer: Self-pay | Admitting: Radiology

## 2011-01-26 ENCOUNTER — Emergency Department (HOSPITAL_COMMUNITY): Payer: Self-pay

## 2011-01-26 ENCOUNTER — Encounter: Payer: Self-pay | Admitting: Internal Medicine

## 2011-01-26 DIAGNOSIS — M7989 Other specified soft tissue disorders: Secondary | ICD-10-CM

## 2011-01-26 DIAGNOSIS — D6859 Other primary thrombophilia: Secondary | ICD-10-CM

## 2011-01-26 DIAGNOSIS — M79609 Pain in unspecified limb: Secondary | ICD-10-CM

## 2011-01-26 LAB — DIFFERENTIAL
Eosinophils Absolute: 0.6 10*3/uL (ref 0.0–0.7)
Eosinophils Relative: 5 % (ref 0–5)
Lymphocytes Relative: 22 % (ref 12–46)
Lymphs Abs: 2.6 10*3/uL (ref 0.7–4.0)
Monocytes Relative: 9 % (ref 3–12)

## 2011-01-26 LAB — CBC
HCT: 43.1 % (ref 39.0–52.0)
HCT: 43.4 % (ref 39.0–52.0)
Hemoglobin: 15.2 g/dL (ref 13.0–17.0)
MCH: 32.6 pg (ref 26.0–34.0)
MCV: 91.2 fL (ref 78.0–100.0)
Platelets: 216 10*3/uL (ref 150–400)
RBC: 4.65 MIL/uL (ref 4.22–5.81)
RBC: 4.76 MIL/uL (ref 4.22–5.81)
RDW: 13.1 % (ref 11.5–15.5)
WBC: 11.5 10*3/uL — ABNORMAL HIGH (ref 4.0–10.5)

## 2011-01-26 LAB — PROTIME-INR
INR: 0.92 (ref 0.00–1.49)
Prothrombin Time: 12.6 seconds (ref 11.6–15.2)

## 2011-01-26 LAB — POCT I-STAT, CHEM 8
BUN: 15 mg/dL (ref 6–23)
Calcium, Ion: 1.14 mmol/L (ref 1.12–1.32)
Chloride: 105 mEq/L (ref 96–112)
Glucose, Bld: 95 mg/dL (ref 70–99)
HCT: 45 % (ref 39.0–52.0)
Potassium: 3.8 mEq/L (ref 3.5–5.1)

## 2011-01-26 MED ORDER — IOHEXOL 300 MG/ML  SOLN
80.0000 mL | Freq: Once | INTRAMUSCULAR | Status: AC | PRN
  Administered 2011-01-26: 80 mL via INTRAVENOUS
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
## 2011-01-27 LAB — GC/CHLAMYDIA PROBE AMP, URINE
Chlamydia, Swab/Urine, PCR: NEGATIVE
GC Probe Amp, Urine: NEGATIVE

## 2011-01-29 NOTE — Discharge Summary (Signed)
Pt was admitted for running out of his coumadin for several weeks and had new swelling and pain of LE. DVT by doppler. Restarted on lovenox/coumadin. Will follow with Dr. Alexandria Lodge in coumadin clinic. Had question of STD from girlfriend pending on discharge, no symptoms so not treated. Came back negative so no need to treat. F/U on making sure he is taking coumadin and following with Dr. Alexandria Lodge.

## 2011-01-31 ENCOUNTER — Ambulatory Visit (INDEPENDENT_AMBULATORY_CARE_PROVIDER_SITE_OTHER): Payer: Self-pay | Admitting: Pharmacist

## 2011-01-31 DIAGNOSIS — I82409 Acute embolism and thrombosis of unspecified deep veins of unspecified lower extremity: Secondary | ICD-10-CM

## 2011-01-31 DIAGNOSIS — D6859 Other primary thrombophilia: Secondary | ICD-10-CM

## 2011-01-31 DIAGNOSIS — Z7901 Long term (current) use of anticoagulants: Secondary | ICD-10-CM

## 2011-01-31 LAB — POCT INR: INR: 2.1

## 2011-01-31 NOTE — Patient Instructions (Signed)
Patient instructed to take medications as defined in the Anti-coagulation Track section of this encounter.  Patient instructed to take today's dose.  Patient verbalized understanding of these instructions.    

## 2011-01-31 NOTE — Progress Notes (Signed)
Anti-Coagulation Progress Note  [name] [name] is a 30 y.o. male who is currently on an anti-coagulation regimen.    RECENT RESULTS: Recent results are below, the most recent result is correlated with a dose of 160 mg. per week: Lab Results  Component Value Date   INR 2.1 01/31/2011   INR 0.92 01/25/2011   INR 1.7 09/14/2010    ANTI-COAG DOSE:   Latest dosing instructions   Total [name] Tue Wed Thu Fri Sat   175 25 mg 25 mg 25 mg 25 mg 25 mg 25 mg 25 mg    (10 mg2.5) (10 mg2.5) (10 mg2.5) (10 mg2.5) (10 mg2.5) (10 mg2.5) (10 mg2.5)         ANTICOAG SUMMARY: Anticoagulation Episode Summary              Current INR goal 2.0-3.0 Next INR check 02/07/2011   INR from last check 2.1 (01/31/2011)     Weekly max dose (mg)  Target end date Indefinite   Indications PRIMARY HYPERCOAGULABLE STATE, DVT, Long term current use of anticoagulant   INR check location [facility] Preferred lab    Send INR reminders to [facility]   Comments        Provider Role Specialty Phone number   Blanch Media  Internal Medicine [phone]        ANTICOAG TODAY: Anticoagulation Summary as of 01/31/2011              INR goal 2.0-3.0     Selected INR 2.1 (01/31/2011) Next INR check 02/07/2011   Weekly max dose (mg)  Target end date Indefinite   Indications PRIMARY HYPERCOAGULABLE STATE, DVT, Long term current use of anticoagulant    Anticoagulation Episode Summary              INR check location [facility] Preferred lab    Send INR reminders to [facility]   Comments        Provider Role Specialty Phone number   Blanch Media  Internal Medicine [phone]        PATIENT INSTRUCTIONS: Patient Instructions  Patient instructed to take medications as defined in the Anti-coagulation Track section of this encounter.  Patient instructed to take today's dose.  Patient verbalized understanding of these instructions.        FOLLOW-UP Return in 7 days (on  02/07/2011) for Follow up INR.  [name], III Pharm.D., CACP

## 2011-02-07 ENCOUNTER — Ambulatory Visit (INDEPENDENT_AMBULATORY_CARE_PROVIDER_SITE_OTHER): Payer: Self-pay | Admitting: Pharmacist

## 2011-02-07 DIAGNOSIS — Z7901 Long term (current) use of anticoagulants: Secondary | ICD-10-CM

## 2011-02-07 DIAGNOSIS — I82409 Acute embolism and thrombosis of unspecified deep veins of unspecified lower extremity: Secondary | ICD-10-CM

## 2011-02-07 DIAGNOSIS — D6859 Other primary thrombophilia: Secondary | ICD-10-CM

## 2011-02-07 LAB — POCT INR: INR: 5.5

## 2011-02-07 MED ORDER — WARFARIN SODIUM 10 MG PO TABS: ORAL_TABLET | ORAL | Status: DC

## 2011-02-07 NOTE — Progress Notes (Signed)
Anti-Coagulation Progress Note  [name] [name] is a 30 y.o. male who is currently on an anti-coagulation regimen.    RECENT RESULTS: Recent results are below, the most recent result is correlated with a dose of 175 mg. per week: Will OMIT/HOLD today's dose and decrease from 175mg /wk to 160mg /wk and RTC in 2 weeks. Lab Results  Component Value Date   INR 5.5 02/07/2011   INR 2.1 01/31/2011   INR 0.92 01/25/2011    ANTI-COAG DOSE:   Latest dosing instructions   Total [name] Tue Wed Thu Fri Sat   160 25 mg 20 mg 20 mg 25 mg 20 mg 25 mg 25 mg    (10 mg2.5) (10 mg2) (10 mg2) (10 mg2.5) (10 mg2) (10 mg2.5) (10 mg2.5)         ANTICOAG SUMMARY: Anticoagulation Episode Summary              Current INR goal 2.0-3.0 Next INR check 02/21/2011   INR from last check 5.5! (02/07/2011)     Weekly max dose (mg)  Target end date Indefinite   Indications PRIMARY HYPERCOAGULABLE STATE, DVT, Long term current use of anticoagulant   INR check location [facility] Preferred lab    Send INR reminders to [facility]   Comments        Provider Role Specialty Phone number   Blanch Media  Internal Medicine [phone]        ANTICOAG TODAY: Anticoagulation Summary as of 02/07/2011              INR goal 2.0-3.0     Selected INR 5.5! (02/07/2011) Next INR check 02/21/2011   Weekly max dose (mg)  Target end date Indefinite   Indications PRIMARY HYPERCOAGULABLE STATE, DVT, Long term current use of anticoagulant    Anticoagulation Episode Summary              INR check location [facility] Preferred lab    Send INR reminders to [facility]   Comments        Provider Role Specialty Phone number   Blanch Media  Internal Medicine [phone]        PATIENT INSTRUCTIONS: Patient Instructions  Patient instructed to take medications as defined in the Anti-coagulation Track section of this encounter.  Patient instructed to OMIT today's dose.  Patient verbalized  understanding of these instructions.        FOLLOW-UP Return in 2 weeks (on 02/21/2011).  [name], III Pharm.D., CACP

## 2011-02-07 NOTE — Patient Instructions (Signed)
Patient instructed to take medications as defined in the Anti-coagulation Track section of this encounter.  Patient instructed to OMIT today's dose.  Patient verbalized understanding of these instructions.    

## 2011-02-09 ENCOUNTER — Encounter: Payer: BC Managed Care – PPO | Admitting: Internal Medicine
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
## 2011-02-21 ENCOUNTER — Ambulatory Visit: Payer: Self-pay

## 2011-11-14 ENCOUNTER — Emergency Department (HOSPITAL_COMMUNITY)
Admission: EM | Admit: 2011-11-14 | Discharge: 2011-11-14 | Disposition: A | Discharge status: Home / Self Care | Payer: Self-pay | Attending: Emergency Medicine | Admitting: Emergency Medicine

## 2011-11-14 ENCOUNTER — Encounter (HOSPITAL_COMMUNITY): Payer: Self-pay | Admitting: *Deleted

## 2011-11-14 DIAGNOSIS — K029 Dental caries, unspecified: Secondary | ICD-10-CM

## 2011-11-14 DIAGNOSIS — F17200 Nicotine dependence, unspecified, uncomplicated: Secondary | ICD-10-CM | POA: Insufficient documentation

## 2011-11-14 DIAGNOSIS — D6859 Other primary thrombophilia: Secondary | ICD-10-CM | POA: Insufficient documentation

## 2011-11-14 DIAGNOSIS — Z7901 Long term (current) use of anticoagulants: Secondary | ICD-10-CM | POA: Insufficient documentation

## 2011-11-14 LAB — CBC
HCT: 40.8 % (ref 39.0–52.0)
Hemoglobin: 14.2 g/dL (ref 13.0–17.0)
MCH: 31.7 pg (ref 26.0–34.0)
MCHC: 34.8 g/dL (ref 30.0–36.0)
MCV: 91.1 fL (ref 78.0–100.0)
Platelets: 195 10*3/uL (ref 150–400)
RBC: 4.48 MIL/uL (ref 4.22–5.81)
RDW: 13.2 % (ref 11.5–15.5)
WBC: 10.9 K/uL — ABNORMAL HIGH (ref 4.0–10.5)

## 2011-11-14 LAB — PROTIME-INR
INR: 2.24 — ABNORMAL HIGH (ref 0.00–1.49)
Prothrombin Time: 25.2 seconds — ABNORMAL HIGH (ref 11.6–15.2)

## 2011-11-14 MED ORDER — HYDROMORPHONE HCL PF 1 MG/ML IJ SOLN
1.0000 mg | Freq: Once | INTRAMUSCULAR | Status: AC
  Administered 2011-11-14: 1 mg via INTRAMUSCULAR
  Filled 2011-11-14: qty 1

## 2011-11-14 MED ORDER — VITAMIN K1 10 MG/ML IJ SOLN
2.5000 mg | Freq: Once | INTRAVENOUS | Status: AC
  Administered 2011-11-14: 2.5 mg via INTRAVENOUS
  Filled 2011-11-14: qty 0.25

## 2011-11-14 MED ORDER — HYDROMORPHONE HCL PF 1 MG/ML IJ SOLN
INTRAMUSCULAR | Status: AC
  Administered 2011-11-14: 1 mg via INTRAVENOUS
  Filled 2011-11-14: qty 1

## 2011-11-14 MED ORDER — PENICILLIN V POTASSIUM 500 MG PO TABS: 500.0000 mg | ORAL_TABLET | Freq: Three times a day (TID) | ORAL | Status: AC

## 2011-11-14 MED ORDER — OXYCODONE HCL 5 MG PO CAPS: 5.0000 mg | ORAL_CAPSULE | ORAL | Status: DC | PRN

## 2011-11-14 NOTE — ED Notes (Signed)
Pt reports progressively worsening dental carries to entire mouth, no dental insurance

## 2011-11-14 NOTE — ED Provider Notes (Signed)
History     CSN: [id number]  Arrival date & time 11/14/11  1354   First MD Initiated Contact with Patient 11/14/11 1600      Chief Complaint  Patient presents with  . Dental Problem    (Consider location/radiation/quality/duration/timing/severity/associated sxs/prior treatment) HPI Comments: Pt with poor dentition, recently got job back so unsure about dental health insurance, began with toothache to bottom left lower tooth, 4 days ago, but not entire left side on to top and bottom are in severe pain.  Was taking aspirin, took 2 tylenol PMs without any sig relief.  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  He denies any bleeding, no fevers. Able to talk, open mouth, eat.  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    The history is provided by the patient and a relative.    Past Medical History  Diagnosis Date  . Factor V Leiden mutation     With resultant hypercoaguable state, Hx of DVT, PE, SP IVC filter placement, on chronic coumadin and requires high doses to maintine IRN 2-3.  Marland Kitchen Anxiety and depression   . Pneumothorax 8/11    HX of, requiring chest tube/hospitalization.   . Venous stasis     bilateral LE.    Past Surgical History  Procedure Date  . Placement of large-bore right chest tube 8/11  . Right minithoracotomy with evacuation of hematoma 8/09  . Placement of ivc filter 8/09    History reviewed. No pertinent family history.  History  Substance Use Topics  . Smoking status: Current Everyday Smoker -- 1.0 packs/day    Types: Cigarettes  . Smokeless tobacco: Not on file   Comment: Started smokin at age 47.   Marland Kitchen Alcohol Use: Yes     occasional.       Review of Systems  Constitutional: Negative for fever and chills.  HENT: Positive for dental problem. Negative for sore throat and trouble swallowing.   Respiratory: Negative for cough and shortness of breath.   Cardiovascular: Negative for chest pain and leg  swelling.  Gastrointestinal: Negative for nausea and vomiting.    Allergies  Review of patient's allergies indicates no known allergies.  Home Medications   Current Outpatient Rx  Name Route Sig Dispense Refill  . DIPHENHYDRAMINE-APAP (SLEEP) 25-500 MG PO TABS Oral Take 1 tablet by mouth at bedtime as needed. For pain    . WARFARIN SODIUM 10 MG PO TABS Oral Take 20-25 mg by mouth daily. Take 20mg  on Monday, Wednesday, Friday, Saturday.  Take 25 mg on Tuesday, Thursday, sunday    . OXYCODONE HCL 5 MG PO CAPS Oral Take 1 capsule (5 mg total) by mouth every 4 (four) hours as needed. 20 capsule 0  . PENICILLIN V POTASSIUM 500 MG PO TABS Oral Take 1 tablet (500 mg total) by mouth 3 (three) times daily. 30 tablet 0    BP 143/78  Pulse 82  Temp 98.7 F (37.1 C) (Oral)  Resp 16  SpO2 95%  Physical Exam  Vitals reviewed. Constitutional: He appears well-developed and well-nourished. He appears distressed.  HENT:  Head: Normocephalic and atraumatic.  Mouth/Throat: Uvula is midline and oropharynx is clear and moist.    Eyes: Pupils are equal, round, and reactive to light.  Cardiovascular: Normal rate and regular rhythm.   Pulmonary/Chest: Effort normal and breath sounds normal. No respiratory distress.  Abdominal: Soft. He exhibits no distension. There is no tenderness.  Skin:  Skin is warm. He is not diaphoretic.    ED Course  Procedures (including critical care time)  Labs Reviewed  PROTIME-INR - Abnormal; Notable for the following:    Prothrombin Time 25.2 (*)     INR 2.24 (*)     All other components within normal limits  CBC - Abnormal; Notable for the following:    WBC 10.9 (*)     All other components within normal limits   No results found.   1. Dental caries     4:58 PM Pt reports he forgot coumadin last night.  I spoke to Dr. Barbette Merino who is willing to see pt in office tomorrow at 0915.  This is relayed to pt and family.  I also spoke to [facility] resident who will  discuss with pharmacy and help give further advice to either me or to Dr. Barbette Merino directly regarding anticoagulation for patient.      5:07 PM I spoke to Dr. Allena Katz who spoke to pharmacist, recommend 1 dose of IV vitamin K+ of 2.5 mg and he should be reversed for any procedure to be done tomorrow morning.  Afterwards, he can resume based on Dr. Randa Evens recommendations following any procedure.  Pt's Greenfield will prevent any PE's.    MDM  Pt is tearful, in severe pain, is slightly drowsy due to taking Tylenol PM from his work boss.  Will give analgesic, speak to oral surgeon to help arrange follow up.  Pt is on coumadin, will check CBC and PT.          Gavin Pound. Carmalita Wakefield, MD 11/14/11 1708

## 2011-11-14 NOTE — Discharge Instructions (Signed)
Narcotic and benzodiazepine use may cause drowsiness, slowed breathing or dependence.  Please use with caution and do not drive, operate machinery or watch young children alone while taking them.  Taking combinations of these medications or drinking alcohol will potentiate these effects.    

## 2011-11-16 ENCOUNTER — Telehealth: Payer: Self-pay | Admitting: *Deleted

## 2011-11-16 ENCOUNTER — Encounter: Payer: Self-pay | Admitting: Internal Medicine

## 2011-11-16 ENCOUNTER — Ambulatory Visit (INDEPENDENT_AMBULATORY_CARE_PROVIDER_SITE_OTHER): Payer: Self-pay | Admitting: Internal Medicine

## 2011-11-16 VITALS — BP 132/80 | HR 70 | Ht 71.5 in | Wt 225.7 lb

## 2011-11-16 DIAGNOSIS — L0390 Cellulitis, unspecified: Secondary | ICD-10-CM | POA: Insufficient documentation

## 2011-11-16 DIAGNOSIS — D6859 Other primary thrombophilia: Secondary | ICD-10-CM

## 2011-11-16 DIAGNOSIS — K0889 Other specified disorders of teeth and supporting structures: Secondary | ICD-10-CM | POA: Insufficient documentation

## 2011-11-16 DIAGNOSIS — Z7901 Long term (current) use of anticoagulants: Secondary | ICD-10-CM

## 2011-11-16 DIAGNOSIS — O2230 Deep phlebothrombosis in pregnancy, unspecified trimester: Secondary | ICD-10-CM

## 2011-11-16 DIAGNOSIS — K089 Disorder of teeth and supporting structures, unspecified: Secondary | ICD-10-CM

## 2011-11-16 MED ORDER — OXYCODONE HCL 5 MG PO CAPS: 5.0000 mg | ORAL_CAPSULE | Freq: Four times a day (QID) | ORAL | Status: AC | PRN

## 2011-11-16 MED ORDER — WARFARIN SODIUM 10 MG PO TABS: 20.0000 mg | ORAL_TABLET | Freq: Every day | ORAL | Status: DC

## 2011-11-16 MED ORDER — SULFAMETHOXAZOLE-TRIMETHOPRIM 800-160 MG PO TABS: 1.0000 | ORAL_TABLET | Freq: Two times a day (BID) | ORAL | Status: AC

## 2011-11-16 NOTE — Progress Notes (Signed)
Patient ID: [name] [name], male   DOB: 09-22-1980, 31 y.o.   MRN: [id number]  Subjective:   Patient ID: [name] [name] male   DOB: 19-Mar-1981 31 y.o.   MRN: [id number]  HPI: [name] is a 31 y.o. with a long-standing history of hypercoagulability secondary to heterozygous factor V Leiden and positive lupus anticoagulant, on lifelong Coumadin, who presents for a followup visit following his recent visit to the ED.  Patient had poor dentition and began with toothache to left lower teeth for several days. He visited ED on 11/14/11 due to severe tooth pain. Pt is also complicated being on coumadin due to history of DVT secondary to heterozygous factor V Leiden. His INR was 2.24 on 11/14/11. He was given Vitamin K in ED on 11/14/11 because of planned dental work. He reports that he had 4 left lower molars removed by dentist in this morning. He does not have active bleeding from the dental surgery, but has severe pain over the left face. He does not have fever or chills. He's currently taking oral penicillin VK.   Patient has chronic bilateral lower leg edema secondary to venous insufficiency. He is wearing compression stockings currently. 7 days ago, patient had a small skin injury at the left posterior lower leg while he was digging a hole underground. 3 days later, patient started having pain and swelling over the same area. He feels like it is very different with his previous DVT since the pain is very superficial this time. He reports that this problem has been getting better and does not bother him currently. Patient also has one small ulcer over each lower leg posteriorly, which has been going on for long time. There is no discharge from the ulcers.   Denies fever, chills, fatigue, headaches,  cough, chest pain, SOB,  abdominal pain,diarrhea, constipation, dysuria, urgency, frequency, hematuria.    Past Medical History  Diagnosis Date  . Factor V Leiden mutation     With resultant  hypercoaguable state, Hx of DVT, PE, SP IVC filter placement, on chronic coumadin and requires high doses to maintine IRN 2-3.  Marland Kitchen Anxiety and depression   . Pneumothorax 8/11    HX of, requiring chest tube/hospitalization.   . Venous stasis     bilateral LE.   Current Outpatient Prescriptions  Medication Sig Dispense Refill  . diphenhydramine-acetaminophen (TYLENOL PM) 25-500 MG TABS Take 1 tablet by mouth at bedtime as needed. For pain      . oxycodone (OXY-IR) 5 MG capsule Take 1 capsule (5 mg total) by mouth every 6 (six) hours as needed.  30 capsule  0  . penicillin v potassium (VEETID) 500 MG tablet Take 1 tablet (500 mg total) by mouth 3 (three) times daily.  30 tablet  0  . sulfamethoxazole-trimethoprim (BACTRIM DS) 800-160 MG per tablet Take 1 tablet by mouth 2 (two) times daily.  20 tablet  0  . warfarin (COUMADIN) 10 MG tablet Take 2-2.5 tablets (20-25 mg total) by mouth daily. Take 20mg  on Monday, Wednesday, Friday, Saturday.  Take 25 mg on Tuesday, Thursday, sunday  45 tablet  0   No family history on file. History   Social History  . Marital Status: Single    Spouse Name: N/A    Number of Children: N/A  . Years of Education: N/A   Social History Main Topics  . Smoking status: Current Everyday Smoker -- 1.0 packs/day    Types: Cigarettes  . Smokeless tobacco: None  Comment: Started smokin at age 57.   Marland Kitchen Alcohol Use: Yes     occasional.   . Drug Use: None  . Sexually Active: None   Other Topics Concern  . None   Social History Narrative   Currently works for [employer], Lives with his girlfriend and has one son.    Review of Systems: General: no fevers, chills, no changes in body weight, no changes in appetite Skin: no rash HEENT: no blurry vision, hearing changes or sore throat. Has facial pain on the left side. Pulm: no dyspnea, coughing, wheezing CV: no chest pain, palpitations, shortness of breath Abd: no nausea/vomiting, abdominal pain,  diarrhea/constipation GU: no dysuria, hematuria, polyuria Ext: has lower leg ulcers and pain bilaterally. Neuro: no weakness, numbness, or tingling   Objective:  Physical Exam: Filed Vitals:   11/16/11 1454  BP: 132/80  Pulse: 70  TempSrc: Oral  Height: 5' 11.5" (1.816 m)  Weight: 225 lb 11.2 oz (102.377 kg)  SpO2: 95%   General: resting in bed, not in acute distress HEENT: PERRL, EOMI, no scleral icterus Cardiac: S1/S2, RRR, No murmurs, gallops or rubs Pulm: Good air movement bilaterally, Clear to auscultation bilaterally, No rales, wheezing, rhonchi or rubs. Abd: Soft,  nondistended, nontender, no rebound pain, no organomegaly, BS present Ext:  2+DP/PT pulse bilaterally. There are focal tenderness, warmth, and erythema over left lower leg posteriorly, which is below the calf area, approximately 3x3 cm in size.. There is no tenderness or cord over the calf areas. There are hyperpigmentation, scaling, healed ulcerations consistent with venous stasis change on both lower legs.  Musculoskeletal: No joint deformities, erythema, or stiffness, ROM full and nontender Skin: no rashes. No skin bruise. Neuro: alert and oriented X3, cranial nerves II-XII grossly intact, muscle strength 5/5 in all extremeties,  sensation to light touch intact.  Psych.: patient is not psychotic, no suicidal or hemocidal ideation.   Assessment & Plan:

## 2011-11-16 NOTE — Telephone Encounter (Signed)
Patient has not been seen here since 2012, he needs to be seen ASAP. He needs INR and CBC rechecked. He needs to report any bleeding to [place] immediately. He needs to be re-evaluated formally no later than tomorrow. Discussed with Inocencio Homes.

## 2011-11-16 NOTE — Telephone Encounter (Signed)
Call from [place], pt's friend stating pt had 4 teeth pulled today.  He was given Vitamin K in Ed on Monday because of planned dental work. Pt has been taking coumadin 20 - 25 mg daily.  Last coumadin dose Saturday. Last visit here was 02/07/2011 ??? Pt # [phone]  Hx: DVT   I talked with Dr Alexandria Lodge and he wants pt to restart normal coumadin dose tomorrow.  Please come to coumadin clinic Monday 8/5

## 2011-11-16 NOTE — Assessment & Plan Note (Signed)
Patient is on lifelong Coumadin. He missed a dose of Coumadin at 11/13/11 and received 1 dose of vitamin K injection for his dental surgery 2 days ago. Today his INR 1.2. Dr. Alexandria Lodge was consulted, who suggested that the patient should restart taking his usual dose of Coumadin tomorrow. Currently patient doesn't have signs of DVT. Patient has a green filter which should protect him from any possibility of pulmonary embolism. Patient will be followed up by Dr. Alexandria Lodge on  11/21/11.

## 2011-11-16 NOTE — Patient Instructions (Signed)
1. Please start taking Coumadin from tomorrow. It is extremely for you to come back on 11/21/11 to see Dr. Alexandria [name] for monitoring your Coumadin use. 2. Please take all medications as prescribed.  3. If you have worsening of your symptoms or new symptoms arise, please call the clinic ([phone]), or go to the ER immediately if symptoms are severe.    Deep Vein Thrombosis A deep vein thrombosis (DVT) is a blood clot (thrombus) that develops in a deep vein. A DVT is a clot in the deep, larger veins of the leg, arm, or pelvis. These are more dangerous than clots that might form in veins on the surface of the body. Deep vein thrombosis can lead to complications if the clot breaks off and travels in the bloodstream to the lungs. CAUSES Blood clots form in a vein for different reasons. Usually several things cause blood clots. They include:  The flow of blood slows down.   The inside of the vein is damaged in some way.   The person has a condition that makes blood clot more easily. These conditions may include:   Older age (especially over 33 years old).   Having a history of blood clots.   Having major or lengthy surgery. Hip surgery is particularly high-risk.   Breaking a hip or leg.   Sitting or lying still for a long time.   Cancer or cancer treatment.   Having a long, thin tube (catheter) placed inside a vein during a medical procedure.   Being overweight (obese).   Pregnancy and childbirth.   Medicines with estrogen.   Smoking.   Other circulation or heart problems.  SYMPTOMS When a clot forms, it can either partially or totally block the blood flow in that vein. Symptoms of a DVT can include:  Swelling of the leg or arm, especially if one side is much worse.   Warmth and redness of the leg or arm, especially if one side is much worse.   Pain in an arm or leg. If the clot is in the leg, symptoms may be more noticeable or worse when standing or walking.  If the blood clot  travels to the lung, it may cause:  Shortness of breath.   Chest pain. The pain may be worsened by deep breaths.   Coughing up thick mucus (phlegm), possibly flecked with blood.  Anyone with these symptoms should get emergency medical treatment right away. Call your local emergency services (911 in [place] if you have these symptoms. DIAGNOSIS If a DVT is suspected, your caregiver will take a full medical history. He or she will also perform a physical exam. Tests that also may be required include:  Studies of the clotting properties of the blood.   An ultrasound scan.   X-rays to show the flow of blood when special dye is injected into the veins (venography).   Studies of your lungs if you have any chest symptoms.  PREVENTION  Exercise the legs regularly. Take a brisk 30 minute walk every day.   Maintain a weight that is appropriate for your height.   Avoid sitting or lying in bed for long periods of time without moving your legs.   Women, particularly those over the age of 33, should consider the risks and benefits of taking estrogen medicines, including birth control pills.   Do not smoke, especially if you take estrogen medicines.   Long-distance travel can increase your risk. You should exercise your legs by walking or pumping the  muscles every hour.   In hospital prevention:   Prevention may include medical and nonmedical measures.  TREATMENT  The most common treatment for DVT is blood thinning (anticoagulant) medicine, which reduces the blood's tendency to clot. Anticoagulants can stop new blood clots from forming and old ones from growing. They cannot dissolve existing clots. Your body does this by itself over time. Anticoagulants can be given by mouth, by intravenous (IV) access, or by injection. Your caregiver will determine the best program for you.   Less commonly, clot-dissolving drugs (thrombolytics) are used to dissolve a DVT. They carry a high risk of bleeding, so  they are used mainly in severe cases.   Very rarely, a blood clot in the leg needs to be removed surgically.   If you are unable to take anticoagulants, your caregiver may arrange for you to have a filter placed in a main vein in your belly (abdomen). This filter prevents clots from traveling to your lungs.  HOME CARE INSTRUCTIONS  Take all medicines prescribed by your caregiver. Follow the directions carefully.   You will most likely continue taking anticoagulants after you leave the hospital. Your caregiver will advise you on the length of treatment (usually 3 to 6 months, sometimes for life).   Taking too much or too little of an anticoagulant is dangerous. While taking this type of medicine, you will need to have regular blood tests to be sure the dose is correct. The dose can change for many reasons. It is critically important that you take this medicine exactly as prescribed, and that you have blood tests exactly as directed.   Many foods can interfere with anticoagulants. These include foods high in vitamin K, such as spinach, kale, broccoli, cabbage, collard and turnip greens, Brussels sprouts, peas, cauliflower, seaweed, parsley, beef and pork liver, green tea, and soybean oil. Your caregiver should discuss limits on these foods with you or you should arrange a visit with a dietician to answer your questions.   Many medicines can interfere with anticoagulants. You must tell your caregiver about any and all medicines you take. This includes all vitamins and supplements. Be especially cautious with aspirin and anti-inflammatory medicines. Ask your caregiver before taking these.   Anticoagulants can have side effects, mostly excessive bruising or bleeding. You will need to hold pressure over cuts for longer than usual. Avoid alcoholic drinks or consume only very small amounts while taking this medicine.   If you are taking an anticoagulant:   Wear a medical alert bracelet.   Notify your  dentist or other caregivers before procedures.   Avoid contact sports.   Ask your caregiver how soon you can go back to normal activities. Not being active can lead to new clots. Ask for a list of what you should and should not do.   Exercise your lower leg muscles. This is important while traveling.   You may need to wear compression stockings. These are tight elastic stockings that apply pressure to the lower legs. This can help keep the blood in the legs from clotting.   If you are a smoker, you should quit.   Learn as much as you can about DVT.  SEEK MEDICAL CARE IF:  You have unusual bruising or any bleeding problems.   The swelling or pain in your affected arm or leg is not gradually improving.   You anticipate surgery or long-distance travel. You should get specific advice on DVT prevention.   You discover other family members with  blood clots. This may require further testing for inherited diseases or conditions.  SEEK IMMEDIATE MEDICAL CARE IF:  You develop chest pain.   You develop severe shortness of breath.   You begin to cough up bloody mucus or phlegm (sputum).   You feel dizzy or faint.   You develop swelling or pain in the leg.   You have breathing problems after traveling.  MAKE SURE YOU:  Understand these instructions.   Will watch your condition.   Will get help right away if you are not doing well or get worse.  Document Released: 04/04/2005 Document Revised: 03/24/2011 Document Reviewed: 05/27/2010 [facility] Patient Information 2012 [place], [place].

## 2011-11-16 NOTE — Assessment & Plan Note (Addendum)
Patient's pain, redness and warmth at the left posterior lower leg is most likely caused by cellulitis. Physical examination findings are not consistent with DVT. Patient was off Coumadin 2 days ago, his problems started at 4 days ago. Will treat the patient with Bactrim for 10 days.

## 2011-11-16 NOTE — Telephone Encounter (Signed)
Pt called and given appointment today at 2:15 with Dr Clyde Lundborg.  No bleeding noted at this time per friend. Also will see Dr Alexandria Lodge on Monday.

## 2011-11-16 NOTE — Assessment & Plan Note (Signed)
Patient had a dental surgery. 4 molars on the left lower jaw were removed today. Patient still has severe pain in his left face. There is no active bleeding from the surgical site. He does not have fever or chills. He is on oral penicillin VK currently. He will followup with his oral surgeon tomorrow. Will treat patient symptomatically with oxycodone for pain.

## 2011-11-21 ENCOUNTER — Ambulatory Visit: Payer: Self-pay

## 2011-11-21 ENCOUNTER — Ambulatory Visit (INDEPENDENT_AMBULATORY_CARE_PROVIDER_SITE_OTHER): Payer: Self-pay | Admitting: Pharmacist

## 2011-11-21 DIAGNOSIS — O2230 Deep phlebothrombosis in pregnancy, unspecified trimester: Secondary | ICD-10-CM

## 2011-11-21 DIAGNOSIS — D6859 Other primary thrombophilia: Secondary | ICD-10-CM

## 2011-11-21 DIAGNOSIS — Z7901 Long term (current) use of anticoagulants: Secondary | ICD-10-CM

## 2011-11-21 DIAGNOSIS — I82409 Acute embolism and thrombosis of unspecified deep veins of unspecified lower extremity: Secondary | ICD-10-CM

## 2011-11-21 MED ORDER — WARFARIN SODIUM 10 MG PO TABS: 20.0000 mg | ORAL_TABLET | Freq: Every day | ORAL | Status: DC

## 2011-11-21 NOTE — Patient Instructions (Addendum)
Patient instructed to take medications as defined in the Anti-coagulation Track section of this encounter.  Patient instructed to take today's dose.  Patient verbalized understanding of these instructions.    

## 2011-11-21 NOTE — Progress Notes (Signed)
Anti-Coagulation Progress Note  [name] [name] is a 31 y.o. male who is currently on an anti-coagulation regimen.    RECENT RESULTS: Recent results are below, the most recent result is correlated with a dose of 45mg  for past TWO DAYS. Lab Results  Component Value Date   INR 1.20 11/21/2011   INR 1.2 11/16/2011   INR 2.24* 11/14/2011    ANTI-COAG DOSE:   Latest dosing instructions   Total Sun Mon Tue Wed Thu Fri Sat   160 20 mg 25 mg 25 mg 25 mg 25 mg 20 mg 20 mg    (10 mg2) (10 mg2.5) (10 mg2.5) (10 mg2.5) (10 mg2.5) (10 mg2) (10 mg2)         ANTICOAG SUMMARY: Anticoagulation Episode Summary              Current INR goal 2.0-3.0 Next INR check 12/05/2011   INR from last check 1.20! (11/21/2011)     Weekly max dose (mg)  Target end date Indefinite   Indications PRIMARY HYPERCOAGULABLE STATE, DVT, Long term current use of anticoagulant   IN[facility] check location [facility] Pref[facility] lab    Send INR reminders to ANTICOAG IMP   Comments        Provider Role Specialty Phone number   Burns Spain, MD  Internal Medicine [phone]        ANTICOAG TODAY: Anticoagulation Summary as of 11/21/2011              INR goal 2.0-3.0     Selected INR 1.20! (11/21/2011) Next INR check 12/05/2011   Weekly max dose (mg)  Target end date Indefinite   Indications PRIMARY HYPERCOAGULABLE STATE, DVT, Long term current use of anticoagulant    Anticoagulation Episode Summary              INR check location [facility] Preferred lab    Send INR reminders to ANTICOAG IMP   Comments        Provider Role Specialty Phone number   Burns Spain, MD  Internal Medicine [phone]        PATIENT INSTRUCTIONS: Patient Instructions  Patient instructed to take medications as defined in the Anti-coagulation Track section of this encounter.  Patient instructed to take today's dose.  Patient verbalized understanding of these instructions.        FOLLOW-UP Return in 2 weeks  (on 12/05/2011) for Follow up INR at 4:45PM.  [name], III Pharm.D., CACP    Anti-Coagulation Progress Note  [name] [name] is a 31 y.o. male who is currently on an anti-coagulation regimen.    RECENT RESULTS: Recent results are below, the most recent result is correlated with a dose of 45 mg for past two days. Lab Results  Component Value Date   INR 1.20 11/21/2011   INR 1.2 11/16/2011   INR 2.24* 11/14/2011    ANTI-COAG DOSE:   Latest dosing instructions   Total [name] Tue Wed Thu Fri Sat   160 20 mg 25 mg 25 mg 25 mg 25 mg 20 mg 20 mg    (10 mg2) (10 mg2.5) (10 mg2.5) (10 mg2.5) (10 mg2.5) (10 mg2) (10 mg2)         ANTICOAG SUMMARY: Anticoagulation Episode Summary              Current INR goal 2.0-3.0 Next INR check 12/05/2011   INR from last check 1.20! (11/21/2011)     Weekly max dose (mg)  Target end date Indefinite  Indications PRIMARY HYPERCOAGULABLE STATE, DVT, Long term current use of anticoagulant   [facility] check location [facility] Preferred lab    Send INR reminders to ANTICOAG IMP   Comments        Provider Role Specialty Phone number   Burns Spain, MD  Internal Medicine [phone]        ANTICOAG TODAY: Anticoagulation Summary as of 11/21/2011              INR goal 2.0-3.0     Selected INR 1.20! (11/21/2011) Next INR check 12/05/2011   Weekly max dose (mg)  Target end date Indefinite   Indications PRIMARY HYPERCOAGULABLE STATE, DVT, Long term current use of anticoagulant    Anticoagulation Episode Summary              INR check location [facility] Preferred lab    Send INR reminders to ANTICOAG [facility]   Comments        Provider Role Specialty Phone number   Burns Spain, MD  Internal Medicine [phone]        PATIENT INSTRUCTIONS: Patient Instructions  Patient instructed to take medications as defined in the Anti-coagulation Track section of this encounter.  Patient instructed to take today's dose.  Patient  verbalized understanding of these instructions.        FOLLOW-UP Return in 2 weeks (on 12/05/2011) for Follow up INR at 4:45PM.  [name], III Pharm.D., CACP

## 2011-12-05 ENCOUNTER — Ambulatory Visit: Payer: Self-pay

## 2011-12-14 ENCOUNTER — Encounter: Payer: Self-pay | Admitting: Internal Medicine

## 2012-07-19 ENCOUNTER — Ambulatory Visit (INDEPENDENT_AMBULATORY_CARE_PROVIDER_SITE_OTHER): Payer: BC Managed Care – PPO | Admitting: Radiation Oncology

## 2012-07-19 ENCOUNTER — Encounter: Payer: Self-pay | Admitting: Radiation Oncology

## 2012-07-19 VITALS — BP 115/66 | HR 74 | Temp 97.1°F | Ht 72.4 in | Wt 240.7 lb

## 2012-07-19 DIAGNOSIS — I82409 Acute embolism and thrombosis of unspecified deep veins of unspecified lower extremity: Secondary | ICD-10-CM

## 2012-07-19 MED ORDER — ENOXAPARIN SODIUM 80 MG/0.8ML ~~LOC~~ SOLN: 160.0000 mg | Freq: Every day | SUBCUTANEOUS | Status: DC

## 2012-07-19 MED ORDER — WARFARIN SODIUM 10 MG PO TABS: 20.0000 mg | ORAL_TABLET | Freq: Every day | ORAL | Status: DC

## 2012-07-19 NOTE — Progress Notes (Signed)
  Subjective:    Patient ID: [name] [name], male    DOB: 28-Aug-1980, 32 y.o.   MRN: [id number]  HPI Pt is a 32 y.o. man with PMH significant for recurrent DVT on lifelong anticoagulation who presents to clinic today with complaints of R leg swelling. The patient states he has not been taking his warfarin since 11/2011 due to moving away for a temporary job for several months. He states that several days ago he noticed he began having pain in his posterior R leg above the knee, and that his wife told him the area was red and swollen. He states he has had very similar findings with previous DVTs, and would like to restart anticoagulation. He admits to sitting with his legs in a dependent position for many hours during the day while playing video games, and also admits to falling asleep in such positions relatively regularly. He denies any SOB or chest pain.  Review of Systems  All other systems reviewed and are negative.       Objective:   Physical Exam  Constitutional: He is oriented to person, place, and time. He appears well-developed and well-nourished. No distress.  HENT:  Head: Normocephalic and atraumatic.  Eyes: Conjunctivae are normal. Pupils are equal, round, and reactive to light. No scleral icterus.  Neck: Normal range of motion. Neck supple. No tracheal deviation present.  Cardiovascular: Normal rate and regular rhythm.   No murmur heard. Pulmonary/Chest: Effort normal. He has no wheezes. He has no rales.  Abdominal: Soft. Bowel sounds are normal. He exhibits no distension. There is no tenderness.  Musculoskeletal: Normal range of motion.  Localized erythema and edema of posterior R leg above knee. Marked TTP in this area. No pitting edema in BLE.   Neurological: He is alert and oriented to person, place, and time. No cranial nerve deficit.  Skin: Skin is warm and dry. No erythema.  Psychiatric: He has a normal mood and affect. His behavior is normal.          Assessment  & Plan:

## 2012-07-19 NOTE — Patient Instructions (Signed)
Please take 160mg  of lovenox as prescribed this evening (4 syringes x 40mg  each). Check with your pharmacy to see if your insurance will cover enough of your lovenox for you to purchase it. If it does, purchase the lovenox and continue taking 160mg  daily (talk to the pharmacist for details) and make an appointment to see Dr. Alexandria Lodge on Monday. If it does not, please call the clinic so we can look into other options, as you will need to be admitted to the hospital if you cannot take lovenox as an outpatient. Continue taking your warfarin as previously prescribed.

## 2012-07-21 NOTE — Assessment & Plan Note (Signed)
Patient has evidence of superficial thrombophlebitis of the RLE on exam, and there is high suspicion for DVT as well. Given that he would be instructed to bridge back to a therapeutic INR with lovenox/warfarin regardless of the results of LE doppler, this study was not pursued. The patient states he has insurance through his job, but does not have his insurance info with him, and is concerned that he will not be able to afford the co-pay. He was therefore given lovenox samples in addition to his lovenox and coumadin prescriptions being sent to his pharmacy. He was instructed to return to clinic the following day (or go to the ED) for hospital admission if he could not pick up his lovenox prescription, as he only received enough samples for a 2 day supply (all that was available in the [facility]). He expressed understanding and agreed to this plan.  - lovenox 160mg  QD (~1.5mg /kg) - warfarin 20mg  MWFSa 25mg  TTSu (previous regimen) - instructed to f/u with Dr. Alexandria Lodge on Monday 07/23/2012 in the coumadin clinic

## 2012-07-23 ENCOUNTER — Ambulatory Visit (INDEPENDENT_AMBULATORY_CARE_PROVIDER_SITE_OTHER): Payer: BC Managed Care – PPO | Admitting: Pharmacist

## 2012-07-23 DIAGNOSIS — I82409 Acute embolism and thrombosis of unspecified deep veins of unspecified lower extremity: Secondary | ICD-10-CM

## 2012-07-23 DIAGNOSIS — D6859 Other primary thrombophilia: Secondary | ICD-10-CM

## 2012-07-23 DIAGNOSIS — Z7901 Long term (current) use of anticoagulants: Secondary | ICD-10-CM

## 2012-07-23 LAB — POCT INR: INR: 2.2

## 2012-07-23 NOTE — Progress Notes (Signed)
Anti-Coagulation Progress Note  [name] [name] is a 32 y.o. male who is currently on an anti-coagulation regimen.    RECENT RESULTS: Recent results are below, the most recent result is correlated with a dose of 160mg  per week.  plus Lovenox injections since being seen last week. Has ran out of Lovenox syringes (states was given a specific amount in order to bridge him to therapeutic INR). He has no more syringes remaining. Given his INR we will NOT continue any additional bridge therapy.  Lab Results  Component Value Date   INR 2.2 07/23/2012   INR 1.20 11/21/2011   INR 1.2 11/16/2011    ANTI-COAG DOSE: Anticoagulation Dose Instructions as of 07/23/2012     [name] Tue Wed Thu Fri Sat   New Dose 25 mg 20 mg 25 mg 20 mg 25 mg 20 mg 25 mg       ANTICOAG SUMMARY: Anticoagulation Episode Summary   Current INR goal 2.0-3.0  Next INR check 07/30/2012  INR from last check 2.2 (07/23/2012)  Weekly max dose   Target end date Indefinite  [facility]  Preferred lab   Send INR reminders to ANTICOAG IMP   Indications  PRIMARY HYPERCOAGULABLE STATE [289.81] DVT [453.40] Long term current use of anticoagulant [V58.61]        Comments       Anticoagulation Care Providers   Provider Role Specialty Phone number   Burns Spain, MD  Internal Medicine [phone]      ANTICOAG TODAY: Anticoagulation Summary as of 07/23/2012   INR goal 2.0-3.0  Selected INR 2.2 (07/23/2012)  Next INR check 07/30/2012  Target end date Indefinite   Indications  PRIMARY HYPERCOAGULABLE STATE [289.81] DVT [453.40] Long term current use of anticoagulant [V58.61]      Anticoagulation Episode Summary   [facility]   Preferred lab    Send INR reminders to ANTICOAG IMP   Comments     Anticoagulation Care Providers   Provider Role Specialty Phone number   Burns Spain, MD  Internal Medicine [phone]      PATIENT INSTRUCTIONS: Patient  Instructions  Patient instructed to take medications as defined in the Anti-coagulation Track section of this encounter.  Patient instructed to take today's dose.  Patient verbalized understanding of these instructions.      FOLLOW-UP Return in 7 days (on 07/30/2012) for Follow up INR at 2PM.  [name], III Pharm.D., CACP

## 2012-07-23 NOTE — Patient Instructions (Signed)
Patient instructed to take medications as defined in the Anti-coagulation Track section of this encounter.  Patient instructed to take today's dose.  Patient verbalized understanding of these instructions.    

## 2012-07-23 NOTE — Progress Notes (Signed)
I saw and evaluated the patient.  I personally confirmed the key portions of Dr. Dawson Bills history and exam and reviewed pertinent patient test results.  The assessment, diagnosis, and plan were formulated together and I agree with the documentation in the resident's note.

## 2012-08-22 ENCOUNTER — Encounter (HOSPITAL_COMMUNITY): Payer: Self-pay | Admitting: Emergency Medicine

## 2012-08-22 ENCOUNTER — Emergency Department (HOSPITAL_COMMUNITY): Payer: BC Managed Care – PPO

## 2012-08-22 ENCOUNTER — Emergency Department (HOSPITAL_COMMUNITY)
Admission: EM | Admit: 2012-08-22 | Discharge: 2012-08-23 | Disposition: A | Discharge status: Home / Self Care | Payer: BC Managed Care – PPO | Attending: Emergency Medicine | Admitting: Emergency Medicine

## 2012-08-22 DIAGNOSIS — Z8709 Personal history of other diseases of the respiratory system: Secondary | ICD-10-CM | POA: Insufficient documentation

## 2012-08-22 DIAGNOSIS — Z9119 Patient's noncompliance with other medical treatment and regimen: Secondary | ICD-10-CM | POA: Insufficient documentation

## 2012-08-22 DIAGNOSIS — F17200 Nicotine dependence, unspecified, uncomplicated: Secondary | ICD-10-CM | POA: Insufficient documentation

## 2012-08-22 DIAGNOSIS — Z7901 Long term (current) use of anticoagulants: Secondary | ICD-10-CM | POA: Insufficient documentation

## 2012-08-22 DIAGNOSIS — R1032 Left lower quadrant pain: Secondary | ICD-10-CM

## 2012-08-22 DIAGNOSIS — Z91199 Patient's noncompliance with other medical treatment and regimen due to unspecified reason: Secondary | ICD-10-CM | POA: Insufficient documentation

## 2012-08-22 DIAGNOSIS — K5732 Diverticulitis of large intestine without perforation or abscess without bleeding: Secondary | ICD-10-CM | POA: Insufficient documentation

## 2012-08-22 DIAGNOSIS — K5900 Constipation, unspecified: Secondary | ICD-10-CM | POA: Insufficient documentation

## 2012-08-22 DIAGNOSIS — D6859 Other primary thrombophilia: Secondary | ICD-10-CM | POA: Insufficient documentation

## 2012-08-22 DIAGNOSIS — I872 Venous insufficiency (chronic) (peripheral): Secondary | ICD-10-CM | POA: Insufficient documentation

## 2012-08-22 DIAGNOSIS — K5792 Diverticulitis of intestine, part unspecified, without perforation or abscess without bleeding: Secondary | ICD-10-CM

## 2012-08-22 DIAGNOSIS — Z86711 Personal history of pulmonary embolism: Secondary | ICD-10-CM | POA: Insufficient documentation

## 2012-08-22 DIAGNOSIS — Z8659 Personal history of other mental and behavioral disorders: Secondary | ICD-10-CM | POA: Insufficient documentation

## 2012-08-22 LAB — CBC WITH DIFFERENTIAL/PLATELET
Basophils Relative: 0 % (ref 0–1)
Eosinophils Relative: 7 % — ABNORMAL HIGH (ref 0–5)
HCT: 40.5 % (ref 39.0–52.0)
Hemoglobin: 14.9 g/dL (ref 13.0–17.0)
Lymphocytes Relative: 34 % (ref 12–46)
MCHC: 36.8 g/dL — ABNORMAL HIGH (ref 30.0–36.0)
MCV: 87.5 fL (ref 78.0–100.0)
Monocytes Absolute: 1 10*3/uL (ref 0.1–1.0)
Monocytes Relative: 11 % (ref 3–12)
Neutro Abs: 4.4 10*3/uL (ref 1.7–7.7)
RDW: 13.3 % (ref 11.5–15.5)

## 2012-08-22 LAB — OCCULT BLOOD, POC DEVICE: Fecal Occult Bld: NEGATIVE

## 2012-08-22 LAB — URINALYSIS, ROUTINE W REFLEX MICROSCOPIC
Bilirubin Urine: NEGATIVE
Glucose, UA: NEGATIVE mg/dL
Ketones, ur: NEGATIVE mg/dL
Protein, ur: NEGATIVE mg/dL
pH: 6.5 (ref 5.0–8.0)

## 2012-08-22 NOTE — ED Notes (Signed)
Pt states he had left over macaroni and tuna and has had pain since then. Pain is unrelieved by BM. Pt has tried stool softeners and laxatives at home.

## 2012-08-22 NOTE — ED Notes (Signed)
Phlebotomy at bedside.

## 2012-08-22 NOTE — ED Notes (Signed)
Per pt he is having LLQ abdominal pain that feels like pressure and increases with activity x2 days. Denies any other complaints. Pt states he is supposed to be on coumadin but has not been taking it at home. Pt is prescribed coumadin for hx of protein B deficiency and hx of PE.

## 2012-08-22 NOTE — ED Provider Notes (Signed)
History     CSN: [id number]  Arrival date & time 08/22/12  2153   First MD Initiated Contact with Patient 08/22/12 2205      Chief Complaint  Patient presents with  . Abdominal Pain    (Consider location/radiation/quality/duration/timing/severity/associated sxs/prior treatment) Patient is a 32 y.o. male presenting with abdominal pain. The history is provided by the patient and medical records. No language interpreter was used.  Abdominal Pain Pain location:  Suprapubic and LLQ Pain quality: cramping, pressure and sharp   Pain radiates to:  Does not radiate Pain severity:  Mild Onset quality:  Gradual Duration:  2 days Timing:  Constant Progression:  Unchanged Chronicity:  New Context: laxative use and suspicious food intake   Context: not alcohol use, not awakening from sleep, not diet changes, not eating, not medication withdrawal, not previous surgeries, not recent illness, not recent sexual activity, not recent travel, not retching, not sick contacts and not trauma   Relieved by:  Nothing Worsened by:  Coughing, movement, palpation and position changes Ineffective treatments:  Bowel activity Associated symptoms: constipation   Associated symptoms: no anorexia, no belching, no chest pain, no chills, no cough, no diarrhea, no dysuria, no fatigue, no fever, no flatus, no hematemesis, no hematochezia, no hematuria, no melena, no nausea, no shortness of breath, no sore throat, no vaginal bleeding, no vaginal discharge and no vomiting     [name] [name] is a 32 y.o. male  with a hx of factor V leiden noncompliant with coumadin, PE presents to the Emergency Department complaining of gradual, persistent, constant lower abdominal pain onset 2 days ago. Pt states he made tuna mac and cheese and then awoke the next day with the pain. Pt describes the pain as constant abd pressure, rated at a. Associated symptoms include constipation.  Pt has tried stool softeners and laxatives for 2 days  with only a small BM this AM.   Nothing makes it better though he has tried defecating and stairs, laughing, coughing, movement makes it worse and changes the pain from pressure to sharp.  Pt denies fever, chills, neck pain, chest pain, SOB, nausea, vomiting, diarrhea, weakness, dizziness, syncope, dysuria, hematuria, melena, BRBPR.     Past Medical History  Diagnosis Date  . Factor V Leiden mutation     With resultant hypercoaguable state, Hx of DVT, PE, SP IVC filter placement, on chronic coumadin and requires high doses to maintine IRN 2-3.  Marland Kitchen Anxiety and depression   . Pneumothorax 8/11    HX of, requiring chest tube/hospitalization.   . Venous stasis     bilateral LE.    Past Surgical History  Procedure Laterality Date  . Placement of large-bore right chest tube  8/11  . Right minithoracotomy with evacuation of hematoma  8/09  . Placement of ivc filter  8/09    No family history on file.  History  Substance Use Topics  . Smoking status: Current Every Day Smoker -- 1.50 packs/day    Types: Cigarettes  . Smokeless tobacco: Not on file     Comment: Started smokin at age 35.   Marland Kitchen Alcohol Use: Yes     Comment: occasional.       Review of Systems  Constitutional: Negative for fever, chills, diaphoresis, appetite change, fatigue and unexpected weight change.  HENT: Negative for sore throat, mouth sores, trouble swallowing, neck pain and neck stiffness.   Respiratory: Negative for cough, chest tightness, shortness of breath, wheezing and stridor.   Cardiovascular:  Negative for chest pain and palpitations.  Gastrointestinal: Positive for abdominal pain and constipation. Negative for nausea, vomiting, diarrhea, blood in stool, melena, hematochezia, abdominal distention, rectal pain, anorexia, flatus and hematemesis.  Genitourinary: Negative for dysuria, urgency, frequency, hematuria, flank pain, vaginal bleeding, vaginal discharge and difficulty urinating.  Musculoskeletal:  Negative for back pain.  Skin: Negative for rash.  Neurological: Negative for weakness.  Hematological: Negative for adenopathy.  Psychiatric/Behavioral: Negative for confusion.  All other systems reviewed and are negative.    Allergies  Review of patient's allergies indicates no known allergies.  Home Medications   Current Outpatient Rx  Name  Route  Sig  Dispense  Refill  . ciprofloxacin (CIPRO) 500 MG tablet   Oral   Take 1 tablet (500 mg total) by mouth every 12 (twelve) hours.   20 tablet   0   . HYDROcodone-acetaminophen (NORCO/VICODIN) 5-325 MG per tablet   Oral   Take 2 tablets by mouth every 4 (four) hours as needed for pain.   6 tablet   0   . metroNIDAZOLE (FLAGYL) 500 MG tablet   Oral   Take 1 tablet (500 mg total) by mouth 2 (two) times daily. One po bid x 7 days   14 tablet   0   . warfarin (COUMADIN) 10 MG tablet   Oral   Take 2-2.5 tablets (20-25 mg total) by mouth daily. Take 20mg  on Monday, Wednesday, Friday, Saturday.  Take 25 mg on Tuesday, Thursday, sunday   45 tablet   0     BP 138/72  Pulse 70  Temp(Src) 98.3 F (36.8 C) (Oral)  Resp 16  SpO2 97%  Physical Exam  Nursing note and vitals reviewed. Constitutional: He is oriented to person, place, and time. He appears well-developed and well-nourished.  HENT:  Head: Normocephalic and atraumatic.  Mouth/Throat: Oropharynx is clear and moist.  Eyes: Conjunctivae are normal. Pupils are equal, round, and reactive to light. No scleral icterus.  Neck: Normal range of motion.  Cardiovascular: Normal rate, regular rhythm, normal heart sounds and intact distal pulses.   Pulmonary/Chest: Effort normal and breath sounds normal. No respiratory distress. He has no wheezes.  Abdominal: Soft. Normal appearance and bowel sounds are normal. He exhibits no distension and no mass. There is tenderness in the suprapubic area and left lower quadrant. There is guarding. There is no rigidity, no rebound, no CVA  tenderness, no tenderness at McBurney's point and negative Murphy's sign.  No peritoneal signs or rebound tenderness  Genitourinary: Rectal exam shows no external hemorrhoid, no internal hemorrhoid, no fissure, no mass, no tenderness and anal tone normal. Guaiac negative stool. Prostate is not enlarged and not tender.  Musculoskeletal: Normal range of motion. He exhibits no edema and no tenderness.  Lymphadenopathy:    He has no cervical adenopathy.  Neurological: He is alert and oriented to person, place, and time. He exhibits normal muscle tone. Coordination normal.  Skin: Skin is warm and dry. No erythema.  Chronic venous stasis skin changes  Psychiatric: He has a normal mood and affect.    ED Course  Procedures (including critical care time)  Labs Reviewed  CBC WITH DIFFERENTIAL - Abnormal; Notable for the following:    MCHC 36.8 (*)    Eosinophils Relative 7 (*)    All other components within normal limits  URINALYSIS, ROUTINE W REFLEX MICROSCOPIC  PROTIME-INR  BASIC METABOLIC PANEL  OCCULT BLOOD, POC DEVICE   Dg Abd Acute W/chest  08/22/2012  *RADIOLOGY REPORT*  Clinical Data: 32 year old male with lower abdominal pain.  History of collapsed left lung.  ACUTE ABDOMEN SERIES (ABDOMEN 2 VIEW & CHEST 1 VIEW)  Comparison: Chest radiographs 01/26/2011 and earlier.  Findings: Stable lung volumes with elevation of the right hemidiaphragm.  Cardiac size and mediastinal contours are within normal limits.  Visualized tracheal air column is within normal limits.  No pneumothorax or pneumoperitoneum.  No acute pulmonary opacity.  IVC filter in place. Nonobstructed bowel gas pattern. No acute osseous abnormality identified.  Abdominal and pelvic visceral contours are within normal limits.  IMPRESSION: 1. Nonobstructed bowel gas pattern, no free air. 2. No acute cardiopulmonary abnormality. 3.  IVC filter in place.   Original Report Authenticated By: Erskine Speed, M.D.      1. Diverticulitis    2. Abdominal pain, LLQ       MDM  [name] [name] presents with suprapubic and LLQ abd pain.  Patient is nontoxic, nonseptic appearing, in no apparent distress.  Patient's pain and other symptoms adequately managed in emergency department. Labs, imaging and vitals reviewed.  I personally reviewed the imaging tests through PACS system.  I reviewed available ER/hospitalization records through the EMR.  Pt without significant constipation.  Patient does not meet the SIRS or Sepsis criteria.  On repeat exam patient does not have a surgical abdomin and there are nor peritoneal signs.  No indication of appendicitis, bowel obstruction, bowel perforation, cholecystitis.  Pt likely with mild diverticulitis.  Hemoccult negative and pt is subtherapeutic on his INR.  Discussed with the patient the need to continue his coumadin and follow-up closely with his PCP. Patient discharged home with Cipro, Flagyl, pain medication and the recommendation for laxative use.  I have also discussed reasons to return immediately to the ER.    BMP pending.  Discussed with Dr Read Drivers who will monitor the lab values and discharge if no anion gap.  Have discussed all of the above with the patient and he is agreeable to the plan.        [name] Client Kmarion Rawl, PA-C 08/23/12 0101

## 2012-08-23 LAB — BASIC METABOLIC PANEL
BUN: 16 mg/dL (ref 6–23)
CO2: 25 mEq/L (ref 19–32)
Calcium: 9.5 mg/dL (ref 8.4–10.5)
Chloride: 104 mEq/L (ref 96–112)
Creatinine, Ser: 0.78 mg/dL (ref 0.50–1.35)

## 2012-08-23 MED ORDER — HYDROCODONE-ACETAMINOPHEN 5-325 MG PO TABS: 2.0000 | ORAL_TABLET | ORAL | Status: DC | PRN

## 2012-08-23 MED ORDER — OXYCODONE-ACETAMINOPHEN 5-325 MG PO TABS
2.0000 | ORAL_TABLET | Freq: Once | ORAL | Status: AC
  Administered 2012-08-23: 2 via ORAL
  Filled 2012-08-23: qty 2

## 2012-08-23 MED ORDER — CIPROFLOXACIN HCL 500 MG PO TABS: 500.0000 mg | ORAL_TABLET | Freq: Two times a day (BID) | ORAL | Status: DC

## 2012-08-23 MED ORDER — METRONIDAZOLE 500 MG PO TABS: 500.0000 mg | ORAL_TABLET | Freq: Two times a day (BID) | ORAL | Status: DC

## 2012-08-23 NOTE — ED Provider Notes (Addendum)
Nursing notes and vitals signs, including pulse oximetry, reviewed.  Summary of this visit's results, reviewed by myself:  Labs:  Results for orders placed during the hospital encounter of 08/22/12 (from the past 24 hour(s))  URINALYSIS, ROUTINE W REFLEX MICROSCOPIC     Status: None   Collection Time    08/22/12 10:45 PM      Result Value Range   Color, Urine YELLOW  YELLOW   APPearance CLEAR  CLEAR   Specific Gravity, Urine 1.027  1.005 - 1.030   pH 6.5  5.0 - 8.0   Glucose, UA NEGATIVE  NEGATIVE mg/dL   Hgb urine dipstick NEGATIVE  NEGATIVE   Bilirubin Urine NEGATIVE  NEGATIVE   Ketones, ur NEGATIVE  NEGATIVE mg/dL   Protein, ur NEGATIVE  NEGATIVE mg/dL   Urobilinogen, UA 1.0  0.0 - 1.0 mg/dL   Nitrite NEGATIVE  NEGATIVE   Leukocytes, UA NEGATIVE  NEGATIVE  OCCULT BLOOD, POC DEVICE     Status: None   Collection Time    08/22/12 10:45 PM      Result Value Range   Fecal Occult Bld NEGATIVE  NEGATIVE  CBC WITH DIFFERENTIAL     Status: Abnormal   Collection Time    08/22/12 10:53 PM      Result Value Range   WBC 9.1  4.0 - 10.5 K/uL   RBC 4.63  4.22 - 5.81 MIL/uL   Hemoglobin 14.9  13.0 - 17.0 g/dL   HCT 19.1  47.8 - 29.5 %   MCV 87.5  78.0 - 100.0 fL   MCH 32.2  26.0 - 34.0 pg   MCHC 36.8 (*) 30.0 - 36.0 g/dL   RDW 62.1  30.8 - 65.7 %   Platelets 183  150 - 400 K/uL   Neutrophils Relative 48  43 - 77 %   Neutro Abs 4.4  1.7 - 7.7 K/uL   Lymphocytes Relative 34  12 - 46 %   Lymphs Abs 3.1  0.7 - 4.0 K/uL   Monocytes Relative 11  3 - 12 %   Monocytes Absolute 1.0  0.1 - 1.0 K/uL   Eosinophils Relative 7 (*) 0 - 5 %   Eosinophils Absolute 0.6  0.0 - 0.7 K/uL   Basophils Relative 0  0 - 1 %   Basophils Absolute 0.0  0.0 - 0.1 K/uL  BASIC METABOLIC PANEL     Status: None   Collection Time    08/22/12 10:53 PM      Result Value Range   Sodium 138  135 - 145 mEq/L   Potassium 4.1  3.5 - 5.1 mEq/L   Chloride 104  96 - 112 mEq/L   CO2 25  19 - 32 mEq/L   Glucose, Bld  89  70 - 99 mg/dL   BUN 16  6 - 23 mg/dL   Creatinine, Ser 8.46  0.50 - 1.35 mg/dL   Calcium 9.5  8.4 - 96.2 mg/dL   GFR calc non Af Amer >90  >90 mL/min   GFR calc Af Amer >90  >90 mL/min  PROTIME-INR     Status: None   Collection Time    08/22/12 10:53 PM      Result Value Range   Prothrombin Time 13.2  11.6 - 15.2 seconds   INR 1.01  0.00 - 1.49    Imaging Studies: Dg Abd Acute W/chest  08/22/2012  *RADIOLOGY REPORT*  Clinical Data: 32 year old male with lower abdominal pain.  History of collapsed  left lung.  ACUTE ABDOMEN SERIES (ABDOMEN 2 VIEW & CHEST 1 VIEW)  Comparison: Chest radiographs 01/26/2011 and earlier.  Findings: Stable lung volumes with elevation of the right hemidiaphragm.  Cardiac size and mediastinal contours are within normal limits.  Visualized tracheal air column is within normal limits.  No pneumothorax or pneumoperitoneum.  No acute pulmonary opacity.  IVC filter in place. Nonobstructed bowel gas pattern. No acute osseous abnormality identified.  Abdominal and pelvic visceral contours are within normal limits.  IMPRESSION: 1. Nonobstructed bowel gas pattern, no free air. 2. No acute cardiopulmonary abnormality. 3.  IVC filter in place.   Original Report Authenticated By: Erskine Speed, M.D.    Medical screening examination/treatment/procedure(s) were performed by non-physician practitioner and as supervising physician I was immediately available for consultation/collaboration.    Hanley Seamen, MD 08/23/12 0216  Hanley Seamen, MD 08/23/12 9811

## 2012-08-27 ENCOUNTER — Ambulatory Visit (INDEPENDENT_AMBULATORY_CARE_PROVIDER_SITE_OTHER): Payer: BC Managed Care – PPO | Admitting: Pharmacist

## 2012-08-27 DIAGNOSIS — I82409 Acute embolism and thrombosis of unspecified deep veins of unspecified lower extremity: Secondary | ICD-10-CM

## 2012-08-27 DIAGNOSIS — D6859 Other primary thrombophilia: Secondary | ICD-10-CM

## 2012-08-27 DIAGNOSIS — Z7901 Long term (current) use of anticoagulants: Secondary | ICD-10-CM

## 2012-08-27 LAB — POCT INR: INR: 1

## 2012-08-27 MED ORDER — WARFARIN SODIUM 10 MG PO TABS: ORAL_TABLET | ORAL | Status: DC

## 2012-08-27 NOTE — Patient Instructions (Signed)
Patient instructed to take medications as defined in the Anti-coagulation Track section of this encounter.  Patient instructed to take today's dose.  Patient verbalized understanding of these instructions.  Patient instructed to self-inject lovenox 1 syringe subcutaneously every 12 hours until seen on Thursday 15-MAY-14 at 4:30PM.

## 2012-08-27 NOTE — Addendum Note (Signed)
Addended by: [name] B on: 08/27/2012 11:45 AM   Modules accepted: Orders

## 2012-08-27 NOTE — Progress Notes (Signed)
Anti-Coagulation Progress Note  [name] [name] is a 32 y.o. male who is currently on an anti-coagulation regimen.    RECENT RESULTS: Recent results are below, the most recent result is correlated with a dose of ZERO mg per week. He had stopped taking warfarin when he noticed blood in stool. He attempted to come here to [facility] but we had no appointments. He went later Friday evening Aug 24, 2012 to the ED whereupon a diagnosis of diverticulitis was made. He was commenced upon both metronidazole and ciprofloxicin--but which will interact with warfarin causing a marked hypoprothrombinemic response. He did NOT recommence warfarin at time of discharge from ED citing "I remembered there were drug-drug interactions with warfarin and antibiotics". We WILL resume warfarin today at a LOWERED DOSE as well as using "bridge" therapy with LMWH (he has syringes remaining from previous bridge therapy at 1mg /kg SQ q12 regimen). We will bridge with these syringes and evaluate INR response on Thursday of this week--15-MAY-14 at 4:30PM.  Lab Results  Component Value Date   INR 1.0 08/27/2012   INR 1.01 08/22/2012   INR 2.2 07/23/2012    ANTI-COAG DOSE: Anticoagulation Dose Instructions as of 08/27/2012     [name] Tue Wed Thu Fri Sat   New Dose 20 mg 20 mg 20 mg 20 mg 20 mg 20 mg 20 mg       ANTICOAG SUMMARY: Anticoagulation Episode Summary   Current INR goal 2.0-3.0  Next INR check 08/30/2012  INR from last check 1.0! (08/27/2012)  Weekly max dose   Target end date Indefinite  [facility] check location [facility]  Preferred lab   Send INR reminders to ANTICOAG IMP   Indications  PRIMARY HYPERCOAGULABLE STATE [289.81] DVT [453.40] Long term current use of anticoagulant [V58.61]        Comments       Anticoagulation Care Providers   Provider Role Specialty Phone number   Burns Spain, MD  Internal Medicine [phone]      ANTICOAG TODAY: Anticoagulation Summary as of 08/27/2012   INR goal  2.0-3.0  Selected INR 1.0! (08/27/2012)  Next INR check 08/30/2012  Target end date Indefinite   Indications  PRIMARY HYPERCOAGULABLE STATE [289.81] DVT [453.40] Long term current use of anticoagulant [V58.61]      Anticoagulation Episode Summary   [facility] check [facility]   Preferred lab    Send INR reminders to ANTICOAG IMP   Comments     Anticoagulation Care Providers   Provider Role Specialty Phone number   Burns Spain, MD  Internal Medicine [phone]      PATIENT INSTRUCTIONS: Patient Instructions  Patient instructed to take medications as defined in the Anti-coagulation Track section of this encounter.  Patient instructed to take today's dose.  Patient verbalized understanding of these instructions.  Patient instructed to self-inject lovenox 1 syringe subcutaneously every 12 hours until seen on Thursday 15-MAY-14 at 4:30PM.    FOLLOW-UP Return in 3 days (on 08/30/2012) for Follow up INR at 4:30PM.  [name], III Pharm.D., CACP

## 2012-08-27 NOTE — Progress Notes (Signed)
Indication: Heterozygous factor V Leiden/Positive lupus anticoagulant.  Duration: Lifelong.  INR below target.  Agree with Dr. Saralyn Pilar assessment and plan as documented.

## 2012-08-30 ENCOUNTER — Other Ambulatory Visit: Payer: BC Managed Care – PPO | Admitting: Pharmacist

## 2012-08-30 ENCOUNTER — Telehealth: Payer: Self-pay | Admitting: Pharmacist

## 2012-08-30 NOTE — Telephone Encounter (Signed)
Patient admits to non-compliance to last warfarin dosing instructions provided. He missed one day of 20mg  warfarin. He was advised to increase commencing today to 25mg  warfarin by mouth daily until seen next Monday for repeat INR on Monday 19-MAY-14 at 4:30PM. He denies any bleeding of any kind or type and denies dyspnea or chest pain. No new medications. Will see patient Monday.

## 2012-11-04 ENCOUNTER — Encounter (HOSPITAL_COMMUNITY): Payer: Self-pay | Admitting: Emergency Medicine

## 2012-11-04 ENCOUNTER — Emergency Department (HOSPITAL_COMMUNITY)
Admission: EM | Admit: 2012-11-04 | Discharge: 2012-11-04 | Disposition: A | Discharge status: Home / Self Care | Payer: BC Managed Care – PPO | Attending: Emergency Medicine | Admitting: Emergency Medicine

## 2012-11-04 DIAGNOSIS — Y929 Unspecified place or not applicable: Secondary | ICD-10-CM | POA: Insufficient documentation

## 2012-11-04 DIAGNOSIS — IMO0002 Reserved for concepts with insufficient information to code with codable children: Secondary | ICD-10-CM | POA: Insufficient documentation

## 2012-11-04 DIAGNOSIS — F341 Dysthymic disorder: Secondary | ICD-10-CM | POA: Insufficient documentation

## 2012-11-04 DIAGNOSIS — L255 Unspecified contact dermatitis due to plants, except food: Secondary | ICD-10-CM | POA: Insufficient documentation

## 2012-11-04 DIAGNOSIS — I872 Venous insufficiency (chronic) (peripheral): Secondary | ICD-10-CM | POA: Insufficient documentation

## 2012-11-04 DIAGNOSIS — Y939 Activity, unspecified: Secondary | ICD-10-CM | POA: Insufficient documentation

## 2012-11-04 DIAGNOSIS — D6859 Other primary thrombophilia: Secondary | ICD-10-CM | POA: Insufficient documentation

## 2012-11-04 DIAGNOSIS — L259 Unspecified contact dermatitis, unspecified cause: Secondary | ICD-10-CM

## 2012-11-04 DIAGNOSIS — Z8709 Personal history of other diseases of the respiratory system: Secondary | ICD-10-CM | POA: Insufficient documentation

## 2012-11-04 DIAGNOSIS — F17200 Nicotine dependence, unspecified, uncomplicated: Secondary | ICD-10-CM | POA: Insufficient documentation

## 2012-11-04 MED ORDER — DEXAMETHASONE SODIUM PHOSPHATE 10 MG/ML IJ SOLN
10.0000 mg | Freq: Once | INTRAMUSCULAR | Status: AC
  Administered 2012-11-04: 10 mg via INTRAMUSCULAR
  Filled 2012-11-04: qty 1

## 2012-11-04 NOTE — ED Notes (Signed)
Pt reports began with rash to B/L LE. This morning pt noticed rash on abdomen area. Pt reports burning and itching. Pt reports no other family members have it.

## 2012-11-04 NOTE — ED Provider Notes (Signed)
History  This chart was scribed for Sharilyn Sites, PA-C working with Candyce Churn, MD by Greggory Stallion, ED scribe. This patient was seen in room TR09C/TR09C and the patient's care was started at 3:03 PM.  CSN: [id number] Arrival date & time 11/04/12  1431   Chief Complaint  Patient presents with  . Rash   The history is provided by the patient. No language interpreter was used.    HPI Comments: [name] [name] is a 32 y.o. male who presents to the Emergency Department complaining of gradual onset, constant rash to his bilateral lower extremities that started Friday. He states he was working in poison ivy on Friday. Pt states since he first noticed it, it has spread up to his abdomen, UE, and back. He states it itches and burns. Pt states he just called home and his wife has it on both knees now. Unsure of change of soaps or detergents because his wife buys them.  No fevers, sweats, or chills.  No recent tick exposure.  Not tried any medications or topical creams.  Pt does not want to take PO steroids-- states they make him irritable.  Past Medical History  Diagnosis Date  . Factor V Leiden mutation     With resultant hypercoaguable state, Hx of DVT, PE, SP IVC filter placement, on chronic coumadin and requires high doses to maintine IRN 2-3.  Marland Kitchen Anxiety and depression   . Pneumothorax 8/11    HX of, requiring chest tube/hospitalization.   . Venous stasis     bilateral LE.   Past Surgical History  Procedure Laterality Date  . Placement of large-bore right chest tube  8/11  . Right minithoracotomy with evacuation of hematoma  8/09  . Placement of ivc filter  8/09   No family history on file. History  Substance Use Topics  . Smoking status: Current Every Day Smoker -- 1.50 packs/day    Types: Cigarettes  . Smokeless tobacco: Not on file     Comment: Started smokin at age 38.   Marland Kitchen Alcohol Use: No     Comment: occasional.     Review of Systems  Skin: Positive for rash.   All other systems reviewed and are negative.    Allergies  Review of patient's allergies indicates no known allergies.  Home Medications  No current outpatient prescriptions on file.  BP 132/87  Pulse 78  Temp(Src) 98.4 F (36.9 C) (Oral)  Resp 18  Ht 6' (1.829 m)  Wt 245 lb (111.131 kg)  BMI 33.22 kg/m2  SpO2 97%  Physical Exam  Nursing note and vitals reviewed. Constitutional: He is oriented to person, place, and time. He appears well-developed and well-nourished.  HENT:  Head: Normocephalic and atraumatic.  Eyes: Conjunctivae and EOM are normal. Pupils are equal, round, and reactive to light.  Neck: Normal range of motion. Neck supple.  Cardiovascular: Normal rate, regular rhythm and normal heart sounds.   Pulmonary/Chest: Effort normal and breath sounds normal.  Musculoskeletal: Normal range of motion.  Neurological: He is alert and oriented to person, place, and time.  Skin: Skin is warm and dry. Rash noted. No petechiae and no purpura noted. Rash is maculopapular. Rash is not pustular and not vesicular.  Diffuse pruritic, maculopapular rash, concentrated more on UE, abdomen, and flanks, some areas of excoriation Chronic cellulitis of BLE  Psychiatric: He has a normal mood and affect.    ED Course  Procedures (including critical care time)  DIAGNOSTIC STUDIES: Oxygen Saturation is  97% on RA, normal by my interpretation.    COORDINATION OF CARE: 3:16 PM-Discussed treatment plan which includes Benadryl for the rash with pt at bedside and pt agreed to plan.   Labs Reviewed - No data to display No results found.  1. Contact dermatitis     MDM   Rash consistent with contact dermatitis.  Decadron given in the ED. Take over-the-counter Benadryl to help with itching and inflammatory response. Followup with primary care physician if symptoms not improving. Discussed plan with patient, he agreed. Return is about the  I personally performed the services described  in this documentation, which was scribed in my presence. The recorded information has been reviewed and is accurate.   Garlon Hatchet, PA-C 11/04/12 1648

## 2012-11-05 ENCOUNTER — Ambulatory Visit (INDEPENDENT_AMBULATORY_CARE_PROVIDER_SITE_OTHER): Payer: BC Managed Care – PPO | Admitting: Pharmacist

## 2012-11-05 DIAGNOSIS — I82409 Acute embolism and thrombosis of unspecified deep veins of unspecified lower extremity: Secondary | ICD-10-CM

## 2012-11-05 DIAGNOSIS — D6859 Other primary thrombophilia: Secondary | ICD-10-CM

## 2012-11-05 DIAGNOSIS — Z7901 Long term (current) use of anticoagulants: Secondary | ICD-10-CM

## 2012-11-05 LAB — POCT INR: INR: 1

## 2012-11-05 MED ORDER — WARFARIN SODIUM 10 MG PO TABS: ORAL_TABLET | ORAL | Status: DC

## 2012-11-05 NOTE — Progress Notes (Signed)
Anti-Coagulation Progress Note  [name] [name] is a 32 y.o. male who is currently on an anti-coagulation regimen.    RECENT RESULTS: Recent results are below, the most recent result is correlated with a dose of ZERO mg. per week (acknowledged non-compliance): Lab Results  Component Value Date   INR 1.0 11/05/2012   INR 1.0 08/27/2012   INR 1.01 08/22/2012    ANTI-COAG DOSE: Anticoagulation Dose Instructions as of 11/05/2012     [name] Tue Wed Thu Fri Sat   New Dose 25 mg 20 mg 25 mg 20 mg 25 mg 20 mg 25 mg       ANTICOAG SUMMARY: Anticoagulation Episode Summary   Current INR goal 2.0-3.0  Next INR check 11/19/2012  INR from last check 1.0! (11/05/2012)  Weekly max dose   Target end date Indefinite  [facility]  Preferred lab   Send INR reminders to ANTICOAG IMP   Indications  PRIMARY HYPERCOAGULABLE STATE [289.81] DVT [453.40] Long term current use of anticoagulant [V58.61]        Comments       Anticoagulation Care Providers   Provider Role Specialty Phone number   Burns Spain, MD  Internal Medicine [phone]      ANTICOAG TODAY: Anticoagulation Summary as of 11/05/2012   INR goal 2.0-3.0  Selected INR 1.0! (11/05/2012)  Next INR check 11/19/2012  Target end date Indefinite   Indications  PRIMARY HYPERCOAGULABLE STATE [289.81] DVT [453.40] Long term current use of anticoagulant [V58.61]      Anticoagulation Episode Summary   [facility] check [facility]   Preferred lab    Send INR reminders to ANTICOAG IMP   Comments     Anticoagulation Care Providers   Provider Role Specialty Phone number   Burns Spain, MD  Internal Medicine [phone]      PATIENT INSTRUCTIONS: Patient Instructions  Patient instructed to take medications as defined in the Anti-coagulation Track section of this encounter.  Patient instructed to take  today's dose.  Patient verbalized understanding of these instructions.        FOLLOW-UP Return in 2 weeks (on 11/19/2012) for Follow up INR at 4PM.  [name], III Pharm.D., CACP

## 2012-11-05 NOTE — Patient Instructions (Signed)
Patient instructed to take medications as defined in the Anti-coagulation Track section of this encounter.  Patient instructed to take today's dose.  Patient verbalized understanding of these instructions.    

## 2012-11-07 NOTE — ED Provider Notes (Signed)
Medical screening examination/treatment/procedure(s) were performed by non-physician practitioner and as supervising physician I was immediately available for consultation/collaboration.  Candyce Churn, MD 11/07/12 1105

## 2012-11-19 ENCOUNTER — Ambulatory Visit: Payer: BC Managed Care – PPO

## 2013-02-19 ENCOUNTER — Emergency Department (HOSPITAL_COMMUNITY)
Admission: EM | Admit: 2013-02-19 | Discharge: 2013-02-19 | Disposition: A | Discharge status: Home / Self Care | Payer: BC Managed Care – PPO | Attending: Emergency Medicine | Admitting: Emergency Medicine

## 2013-02-19 ENCOUNTER — Encounter (HOSPITAL_COMMUNITY): Payer: Self-pay | Admitting: Emergency Medicine

## 2013-02-19 DIAGNOSIS — Z8659 Personal history of other mental and behavioral disorders: Secondary | ICD-10-CM | POA: Insufficient documentation

## 2013-02-19 DIAGNOSIS — K089 Disorder of teeth and supporting structures, unspecified: Secondary | ICD-10-CM | POA: Insufficient documentation

## 2013-02-19 DIAGNOSIS — Z8709 Personal history of other diseases of the respiratory system: Secondary | ICD-10-CM | POA: Insufficient documentation

## 2013-02-19 DIAGNOSIS — K0889 Other specified disorders of teeth and supporting structures: Secondary | ICD-10-CM

## 2013-02-19 DIAGNOSIS — F17200 Nicotine dependence, unspecified, uncomplicated: Secondary | ICD-10-CM | POA: Insufficient documentation

## 2013-02-19 DIAGNOSIS — Z8679 Personal history of other diseases of the circulatory system: Secondary | ICD-10-CM | POA: Insufficient documentation

## 2013-02-19 DIAGNOSIS — Z862 Personal history of diseases of the blood and blood-forming organs and certain disorders involving the immune mechanism: Secondary | ICD-10-CM | POA: Insufficient documentation

## 2013-02-19 MED ORDER — PENICILLIN V POTASSIUM 500 MG PO TABS: 500.0000 mg | ORAL_TABLET | Freq: Four times a day (QID) | ORAL | Status: DC

## 2013-02-19 MED ORDER — TRAMADOL HCL 50 MG PO TABS
50.0000 mg | ORAL_TABLET | Freq: Once | ORAL | Status: AC
  Administered 2013-02-19: 50 mg via ORAL
  Filled 2013-02-19: qty 1

## 2013-02-19 MED ORDER — PENICILLIN V POTASSIUM 250 MG PO TABS
500.0000 mg | ORAL_TABLET | Freq: Once | ORAL | Status: AC
  Administered 2013-02-19: 500 mg via ORAL
  Filled 2013-02-19: qty 2

## 2013-02-19 MED ORDER — HYDROCODONE-ACETAMINOPHEN 5-325 MG PO TABS: 2.0000 | ORAL_TABLET | ORAL | Status: DC | PRN

## 2013-02-19 NOTE — ED Provider Notes (Signed)
CSN: [id number]     Arrival date & time 02/19/13  1325 History  This chart was scribed for non-physician practitioner, Emilia Beck, PA-C working with Enid Skeens, MD by Greggory Stallion, ED scribe. This patient was seen in room TR04C/TR04C and the patient's care was started at 2:41 PM.   Chief Complaint  Patient presents with  . Dental Pain   The history is provided by the patient. No language interpreter was used.   HPI Comments: [name] [name] is a 32 y.o. male who presents to the Emergency Department complaining of gradual onset, constant lower front dental pain with associated facial swelling that started yesterday. He states he just got a dental card and can't go until he gets paid in 4 days. Pt denies fever.   Past Medical History  Diagnosis Date  . Factor V Leiden mutation     With resultant hypercoaguable state, Hx of DVT, PE, SP IVC filter placement, on chronic coumadin and requires high doses to maintine IRN 2-3.  Marland Kitchen Anxiety and depression   . Pneumothorax 8/11    HX of, requiring chest tube/hospitalization.   . Venous stasis     bilateral LE.   Past Surgical History  Procedure Laterality Date  . Placement of large-bore right chest tube  8/11  . Right minithoracotomy with evacuation of hematoma  8/09  . Placement of ivc filter  8/09   History reviewed. No pertinent family history. History  Substance Use Topics  . Smoking status: Current Every Day Smoker -- 1.50 packs/day    Types: Cigarettes  . Smokeless tobacco: Not on file     Comment: Started smokin at age 9.   Marland Kitchen Alcohol Use: No     Comment: occasional.     Review of Systems  Constitutional: Negative for fever.  HENT: Positive for dental problem and facial swelling.   All other systems reviewed and are negative.    Allergies  Review of patient's allergies indicates no known allergies.  Home Medications  No current outpatient prescriptions on file.  BP 142/100  Pulse 78  Temp(Src) 98.6 F (37  C) (Oral)  Resp 16  SpO2 100%  Physical Exam  Nursing note and vitals reviewed. Constitutional: He is oriented to person, place, and time. He appears well-developed and well-nourished. No distress.  HENT:  Head: Normocephalic and atraumatic.  Poor dentition. Multiple decayed teeth. Front lower jaw localized area of tenderness at the base of front teeth.   Eyes: EOM are normal.  Neck: Neck supple. No tracheal deviation present.  Cardiovascular: Normal rate.   Pulmonary/Chest: Effort normal. No respiratory distress.  Musculoskeletal: Normal range of motion.  Neurological: He is alert and oriented to person, place, and time.  Skin: Skin is warm and dry.  Psychiatric: He has a normal mood and affect. His behavior is normal.    ED Course  Procedures (including critical care time)  DIAGNOSTIC STUDIES: Oxygen Saturation is 100% on RA, normal by my interpretation.    COORDINATION OF CARE: 2:43 PM-Discussed treatment plan which includes an antibiotic and pain medication with pt at bedside and pt agreed to plan.   Labs Review Labs Reviewed - No data to display Imaging Review No results found.  EKG Interpretation   None       MDM  No diagnosis found.  2:47 PM Patient will be discharged with Vicodin and Veetid. Patient will follow up with his dentist. Vitals stable and patient afebrile.    I personally performed the services  described in this documentation, which was scribed in my presence. The recorded information has been reviewed and is accurate.   Emilia Beck, PA-C 02/19/13 1448

## 2013-02-19 NOTE — ED Provider Notes (Signed)
Medical screening examination/treatment/procedure(s) were performed by non-physician practitioner and as supervising physician I was immediately available for consultation/collaboration.  EKG Interpretation   None        Ayinde Swim M Kadyn Chovan, MD 02/19/13 1719 

## 2013-02-19 NOTE — ED Notes (Signed)
Pt c/o dental pain. Poor dentition.

## 2013-02-19 NOTE — ED Notes (Signed)
Pt reports having front lower dental pain. Airway intact.

## 2013-09-19 ENCOUNTER — Encounter (HOSPITAL_COMMUNITY): Payer: Self-pay | Admitting: Emergency Medicine

## 2013-09-19 DIAGNOSIS — Z8659 Personal history of other mental and behavioral disorders: Secondary | ICD-10-CM | POA: Insufficient documentation

## 2013-09-19 DIAGNOSIS — Z862 Personal history of diseases of the blood and blood-forming organs and certain disorders involving the immune mechanism: Secondary | ICD-10-CM | POA: Insufficient documentation

## 2013-09-19 DIAGNOSIS — M7989 Other specified soft tissue disorders: Secondary | ICD-10-CM | POA: Insufficient documentation

## 2013-09-19 DIAGNOSIS — F17200 Nicotine dependence, unspecified, uncomplicated: Secondary | ICD-10-CM | POA: Insufficient documentation

## 2013-09-19 DIAGNOSIS — Z8679 Personal history of other diseases of the circulatory system: Secondary | ICD-10-CM | POA: Insufficient documentation

## 2013-09-19 DIAGNOSIS — Z8709 Personal history of other diseases of the respiratory system: Secondary | ICD-10-CM | POA: Insufficient documentation

## 2013-09-19 NOTE — ED Notes (Signed)
Pt. fell asleep sitting on a couch 2 days ago reports swelling at left lower leg , denies pain / no injury / ambulatory.

## 2013-09-20 ENCOUNTER — Emergency Department (HOSPITAL_COMMUNITY)
Admission: EM | Admit: 2013-09-20 | Discharge: 2013-09-20 | Disposition: A | Discharge status: Home / Self Care | Payer: BC Managed Care – PPO | Attending: Emergency Medicine | Admitting: Emergency Medicine

## 2013-09-20 DIAGNOSIS — M7989 Other specified soft tissue disorders: Secondary | ICD-10-CM

## 2013-09-20 LAB — CBC WITH DIFFERENTIAL/PLATELET
BASOS ABS: 0 10*3/uL (ref 0.0–0.1)
BASOS PCT: 0 % (ref 0–1)
EOS PCT: 7 % — AB (ref 0–5)
Eosinophils Absolute: 0.7 10*3/uL (ref 0.0–0.7)
HEMATOCRIT: 43.7 % (ref 39.0–52.0)
HEMOGLOBIN: 15.7 g/dL (ref 13.0–17.0)
Lymphocytes Relative: 25 % (ref 12–46)
Lymphs Abs: 2.6 10*3/uL (ref 0.7–4.0)
MCH: 33.1 pg (ref 26.0–34.0)
MCHC: 35.9 g/dL (ref 30.0–36.0)
MCV: 92.2 fL (ref 78.0–100.0)
MONO ABS: 0.9 10*3/uL (ref 0.1–1.0)
MONOS PCT: 9 % (ref 3–12)
Neutro Abs: 6.1 10*3/uL (ref 1.7–7.7)
Neutrophils Relative %: 59 % (ref 43–77)
Platelets: 234 10*3/uL (ref 150–400)
RBC: 4.74 MIL/uL (ref 4.22–5.81)
RDW: 13.3 % (ref 11.5–15.5)
WBC: 10.4 10*3/uL (ref 4.0–10.5)

## 2013-09-20 LAB — BASIC METABOLIC PANEL
BUN: 13 mg/dL (ref 6–23)
CALCIUM: 9.7 mg/dL (ref 8.4–10.5)
CO2: 25 meq/L (ref 19–32)
CREATININE: 0.91 mg/dL (ref 0.50–1.35)
Chloride: 100 mEq/L (ref 96–112)
GFR calc non Af Amer: >90 mL/min (ref >90)
Glucose, Bld: 93 mg/dL (ref 70–99)
Potassium: 3.8 mEq/L (ref 3.7–5.3)
Sodium: 138 mEq/L (ref 137–147)

## 2013-09-20 LAB — PROTIME-INR
INR: 0.87 (ref 0.00–1.49)
PROTHROMBIN TIME: 11.7 s (ref 11.6–15.2)

## 2013-09-20 MED ORDER — ENOXAPARIN SODIUM 100 MG/ML ~~LOC~~ SOLN
110.0000 mg | Freq: Once | SUBCUTANEOUS | Status: DC
  Filled 2013-09-20: qty 2

## 2013-09-20 MED ORDER — ENOXAPARIN SODIUM 120 MG/0.8ML ~~LOC~~ SOLN
1.0000 mg/kg | Freq: Once | SUBCUTANEOUS | Status: AC
  Administered 2013-09-20: 110 mg via SUBCUTANEOUS
  Filled 2013-09-20: qty 0.8

## 2013-09-20 NOTE — Discharge Instructions (Signed)
Concern for Deep Vein Thrombosis A deep vein thrombosis (DVT) is a blood clot that develops in the deep, larger veins of the leg, arm, or pelvis. These are more dangerous than clots that might form in veins near the surface of the body. A DVT can lead to complications if the clot breaks off and travels in the bloodstream to the lungs.  A DVT can damage the valves in your leg veins, so that instead of flowing upward, the blood pools in the lower leg. This is called post-thrombotic syndrome, and it can result in pain, swelling, discoloration, and sores on the leg. CAUSES Usually, several things contribute to blood clots forming. Contributing factors include:  The flow of blood slows down.  The inside of the vein is damaged in some way.  You have a condition that makes blood clot more easily. RISK FACTORS Some people are more likely than others to develop blood clots. Risk factors include:   Older age, especially over 26 years of age.  Having a family history of blood clots or if you have already had a blot clot.  Having major or lengthy surgery. This is especially true for surgery on the hip, knee, or belly (abdomen). Hip surgery is particularly high risk.  Breaking a hip or leg.  Sitting or lying still for a long time. This includes long-distance travel, paralysis, or recovery from an illness or surgery.  Having cancer or cancer treatment.  Having a long, thin tube (catheter) placed inside a vein during a medical procedure.  Being overweight (obese).  Pregnancy and childbirth.  Hormone changes make the blood clot more easily during pregnancy.  The fetus puts pressure on the veins of the pelvis.  There is a risk of injury to veins during delivery or a caesarean. The risk is highest just after childbirth.  Medicines with the male hormone estrogen. This includes birth control pills and hormone replacement therapy.  Smoking.  Other circulation or heart problems.  SIGNS AND  SYMPTOMS When a clot forms, it can either partially or totally block the blood flow in that vein. Symptoms of a DVT can include:  Swelling of the leg or arm, especially if one side is much worse.  Warmth and redness of the leg or arm, especially if one side is much worse.  Pain in an arm or leg. If the clot is in the leg, symptoms may be more noticeable or worse when standing or walking. The symptoms of a DVT that has traveled to the lungs (pulmonary embolism, PE) usually start suddenly and include:  Shortness of breath.  Coughing.  Coughing up blood or blood-tinged phlegm.  Chest pain. The chest pain is often worse with deep breaths.  Rapid heartbeat. Anyone with these symptoms should get emergency medical treatment right away. Call your local emergency services (911 in the [place] if you have these symptoms. DIAGNOSIS If a DVT is suspected, your health care provider will take a full medical history and perform a physical exam. Tests that also may be required include:  Blood tests, including studies of the clotting properties of the blood.  Ultrasonography to see if you have clots in your legs or lungs.  X-rays to show the flow of blood when dye is injected into the veins (venography).  Studies of your lungs if you have any chest symptoms. PREVENTION  Exercise the legs regularly. Take a brisk 30-minute walk every day.  Maintain a weight that is appropriate for your height.  Avoid sitting or lying  in bed for long periods of time without moving your legs.  Women, particularly those over the age of 35 years, should consider the risks and benefits of taking estrogen medicines, including birth control pills.  Do not smoke, especially if you take estrogen medicines.  Long-distance travel can increase your risk of DVT. You should exercise your legs by walking or pumping the muscles every hour.  In-hospital prevention:  Many of the risk factors above relate to situations that  exist with hospitalization, either for illness, injury, or elective surgery.  Your health care provider will assess you for the need for venous thromboembolism prophylaxis when you are admitted to the hospital. If you are having surgery, your surgeon will assess you the day of or day after surgery.  Prevention may include medical and nonmedical measures. TREATMENT Once identified, a DVT can be treated. It can also be prevented in some circumstances. Once you have had a DVT, you may be at increased risk for a DVT in the future. The most common treatment for DVT is blood thinning (anticoagulant) medicine, which reduces the blood's tendency to clot. Anticoagulants can stop new blood clots from forming and stop old ones from growing. They cannot dissolve existing clots. Your body does this by itself over time. Anticoagulants can be given by mouth, by IV access, or by injection. Your health care provider will determine the best program for you. Other medicines or treatments that may be used are:  Heparin or related medicines (low molecular weight heparin) are usually the first treatment for a blood clot. They act quickly. However, they cannot be taken orally.  Heparin can cause a fall in a component of blood that stops bleeding and forms blood clots (platelets). You will be monitored with blood tests to be sure this does not occur.  Warfarin is an anticoagulant that can be swallowed. It takes a few days to start working, so usually heparin or related medicines are used in combination. Once warfarin is working, heparin is usually stopped.  Less commonly, clot dissolving drugs (thrombolytics) are used to dissolve a DVT. They carry a high risk of bleeding, so they are used mainly in severe cases, where your life or a limb is threatened.  Very rarely, a blood clot in the leg needs to be removed surgically.  If you are unable to take anticoagulants, your health care provider may arrange for you to have a  filter placed in a main vein in your abdomen. This filter prevents clots from traveling to your lungs. HOME CARE INSTRUCTIONS  Take all medicines prescribed by your health care provider. Only take over-the-counter or prescription medicines for pain, fever, or discomfort as directed by your health care provider.  Warfarin. Most people will continue taking warfarin after hospital discharge. Your health care provider will advise you on the length of treatment (usually 3 6 months, sometimes lifelong).  Too much and too little warfarin are both dangerous. Too much warfarin increases the risk of bleeding. Too little warfarin continues to allow the risk for blood clots. While taking warfarin, you will need to have regular blood tests to measure your blood clotting time. These blood tests usually include both the prothrombin time (PT) and international normalized ratio (INR) tests. The PT and INR results allow your health care provider to adjust your dose of warfarin. The dose can change for many reasons. It is critically important that you take warfarin exactly as prescribed, and that you have your PT and INR levels drawn exactly  as directed.  Many foods, especially foods high in vitamin K, can interfere with warfarin and affect the PT and INR results. Foods high in vitamin K include spinach, kale, broccoli, cabbage, collard and turnip greens, brussel sprouts, peas, cauliflower, seaweed, and parsley as well as beef and pork liver, green tea, and soybean oil. You should eat a consistent amount of foods high in vitamin K. Avoid major changes in your diet, or notify your health care provider before changing your diet. Arrange a visit with a dietitian to answer your questions.  Many medicines can interfere with warfarin and affect the PT and INR results. You must tell your health care provider about any and all medicines you take. This includes all vitamins and supplements. Be especially cautious with aspirin and  anti-inflammatory medicines. Ask your health care provider before taking these. Do not take or discontinue any prescribed or over-the-counter medicine except on the advice of your health care provider or pharmacist.  Warfarin can have side effects, primarily excessive bruising or bleeding. You will need to hold pressure over cuts for longer than usual. Your health care provider or pharmacist will discuss other potential side effects.  Alcohol can change the body's ability to handle warfarin. It is best to avoid alcoholic drinks or consume only very small amounts while taking warfarin. Notify your health care provider if you change your alcohol intake.  Notify your dentist or other health care providers before procedures.  Activity. Ask your health care provider how soon you can go back to normal activities. It is important to stay active to prevent blood clots. If you are on anticoagulant medicine, avoid contact sports.  Exercise. It is very important to exercise. This is especially important while traveling, sitting, or standing for long periods of time. Exercise your legs by walking or by pumping the muscles frequently. Take frequent walks.  Compression stockings. These are tight elastic stockings that apply pressure to the lower legs. This pressure can help keep the blood in the legs from clotting. You may need to wear compression stockings at home to help prevent a DVT.  Do not smoke. If you smoke, quit. Ask your health care provider for help with quitting smoking.  Learn as much as you can about DVT. Knowing more about the condition should help you keep it from coming back.  Wear a medical alert bracelet or carry a medical alert card. SEEK MEDICAL CARE IF:  You notice a rapid heartbeat.  You feel weaker or more tired than usual.  You feel faint.  You notice increased bruising.  You feel your symptoms are not getting better in the time expected.  You believe you are having side  effects of medicine. SEEK IMMEDIATE MEDICAL CARE IF:  You have chest pain.  You have trouble breathing.  You have new or increased swelling or pain in one leg.  You cough up blood.  You notice blood in vomit, in a bowel movement, or in urine. MAKE SURE YOU:  Understand these instructions.  Will watch your condition.  Will get help right away if you are not doing well or get worse. Document Released: 04/04/2005 Document Revised: 01/23/2013 Document Reviewed: 12/10/2012 [employer] [employer] [facility] Patient Information 2014 [place], [place].

## 2013-09-20 NOTE — ED Provider Notes (Signed)
CSN: [id number]     Arrival date & time 09/19/13  2322 History   First MD Initiated Contact with Patient 09/20/13 0121     Chief Complaint  Patient presents with  . Leg Swelling     (Consider location/radiation/quality/duration/timing/severity/associated sxs/prior Treatment) HPI  This a 33 year old male with a history of factor V Leiden, DVT, PE who presents with leg swelling. Patient reports that he slept upright on a couch 2 days ago noted swelling of his left lower extremity. He is not currently taking his Coumadin as directed. He does have an IVC filter in place. He denies any chest pain or shortness of breath. He was instructed by his employer to be further evaluated. Patient denies any fevers or skin changes.  He denies a weakness, numbness, or tingling in that extremity. He denies any new injury.  Past Medical History  Diagnosis Date  . Factor V Leiden mutation     With resultant hypercoaguable state, Hx of DVT, PE, SP IVC filter placement, on chronic coumadin and requires high doses to maintine IRN 2-3.  Marland Kitchen. Anxiety and depression   . Pneumothorax 8/11    HX of, requiring chest tube/hospitalization.   . Venous stasis     bilateral LE.   Past Surgical History  Procedure Laterality Date  . Placement of large-bore right chest tube  8/11  . Right minithoracotomy with evacuation of hematoma  8/09  . Placement of ivc filter  8/09   No family history on file. History  Substance Use Topics  . Smoking status: Current Every Day Smoker -- 1.50 packs/day    Types: Cigarettes  . Smokeless tobacco: Not on file     Comment: Started smokin at age 33.   Marland Kitchen. Alcohol Use: No     Comment: occasional.     Review of Systems  Constitutional: Negative.  Negative for fever.  Respiratory: Negative.  Negative for chest tightness and shortness of breath.   Cardiovascular: Positive for leg swelling. Negative for chest pain.  Gastrointestinal: Negative.  Negative for abdominal pain.   Genitourinary: Negative.  Negative for dysuria.  Skin: Negative for color change and rash.  All other systems reviewed and are negative.     Allergies  Review of patient's allergies indicates no known allergies.  Home Medications   Prior to Admission medications   Not on File   BP 129/75  Pulse 71  Temp(Src) 98.1 F (36.7 C) (Oral)  Resp 18  Ht 6' (1.829 m)  Wt 238 lb 4.8 oz (108.092 kg)  BMI 32.31 kg/m2  SpO2 98% Physical Exam  Nursing note and vitals reviewed. Constitutional: He is oriented to person, place, and time. He appears well-developed and well-nourished. No distress.  HENT:  Head: Normocephalic and atraumatic.  Cardiovascular: Normal rate, regular rhythm and normal heart sounds.   Pulmonary/Chest: Effort normal and breath sounds normal. No respiratory distress.  Musculoskeletal: He exhibits no edema.  Trace bilateral lower extremity edema left slightly greater than right, no tenderness to palpation of the calf  Neurological: He is alert and oriented to person, place, and time.  Skin: Skin is warm and dry.  Venous stasis changes bilaterally, no erythema or weeping noted  Psychiatric: He has a normal mood and affect.    ED Course  Procedures (including critical care time) Labs Review Labs Reviewed  CBC WITH DIFFERENTIAL - Abnormal; Notable for the following:    Eosinophils Relative 7 (*)    All other components within normal limits  BASIC METABOLIC  PANEL  PROTIME-INR    Imaging Review No results found.   EKG Interpretation None      MDM   Final diagnoses:  Leg swelling    Patient presents with leg swelling, left greater than right. He is nontoxic on exam. History of DVT. Basic labwork was obtained in order to give patient a Lovenox shot. INR is subtherapeutic.  At this time I do not have access to ultrasound. Patient is not low risk and not a d-dimer candidate. Patient was given Lovenox and will return in the morning for ultrasound.  After  history, exam, and medical workup I feel the patient has been appropriately medically screened and is safe for discharge home. Pertinent diagnoses were discussed with the patient. Patient was given return precautions.     Shon Baton, MD 09/20/13 [phone]

## 2013-09-21 ENCOUNTER — Ambulatory Visit (HOSPITAL_COMMUNITY): Payer: BC Managed Care – PPO | Attending: Emergency Medicine

## 2013-11-21 ENCOUNTER — Ambulatory Visit: Payer: BC Managed Care – PPO | Admitting: Internal Medicine

## 2014-05-26 ENCOUNTER — Emergency Department (HOSPITAL_COMMUNITY)
Admission: EM | Admit: 2014-05-26 | Discharge: 2014-05-26 | Discharge status: Against Medical Advice | Payer: BLUE CROSS/BLUE SHIELD | Attending: Emergency Medicine | Admitting: Emergency Medicine

## 2014-05-26 ENCOUNTER — Encounter (HOSPITAL_COMMUNITY): Payer: Self-pay | Admitting: Emergency Medicine

## 2014-05-26 DIAGNOSIS — Z720 Tobacco use: Secondary | ICD-10-CM | POA: Insufficient documentation

## 2014-05-26 DIAGNOSIS — R05 Cough: Secondary | ICD-10-CM | POA: Diagnosis present

## 2014-05-26 NOTE — ED Notes (Signed)
Pt. reports intermittent productive cough for 2 weeks , requesting work note to go back to work , denies fever or chills, respirations unlabored .

## 2014-05-26 NOTE — ED Notes (Signed)
The patient said he had to go home because he has to go to work.  He advised he could not wait any longer.  I did advise him that he was next to be seen and he still refused to stay.

## 2014-10-05 ENCOUNTER — Emergency Department (HOSPITAL_COMMUNITY)
Admission: EM | Admit: 2014-10-05 | Discharge: 2014-10-05 | Disposition: A | Discharge status: Home / Self Care | Payer: BLUE CROSS/BLUE SHIELD | Attending: Emergency Medicine | Admitting: Emergency Medicine

## 2014-10-05 ENCOUNTER — Encounter (HOSPITAL_COMMUNITY): Payer: Self-pay | Admitting: *Deleted

## 2014-10-05 ENCOUNTER — Emergency Department (HOSPITAL_COMMUNITY): Payer: BLUE CROSS/BLUE SHIELD

## 2014-10-05 DIAGNOSIS — I82409 Acute embolism and thrombosis of unspecified deep veins of unspecified lower extremity: Secondary | ICD-10-CM | POA: Insufficient documentation

## 2014-10-05 DIAGNOSIS — Y9289 Other specified places as the place of occurrence of the external cause: Secondary | ICD-10-CM | POA: Diagnosis not present

## 2014-10-05 DIAGNOSIS — Y998 Other external cause status: Secondary | ICD-10-CM | POA: Insufficient documentation

## 2014-10-05 DIAGNOSIS — Z8679 Personal history of other diseases of the circulatory system: Secondary | ICD-10-CM | POA: Insufficient documentation

## 2014-10-05 DIAGNOSIS — Z8709 Personal history of other diseases of the respiratory system: Secondary | ICD-10-CM | POA: Insufficient documentation

## 2014-10-05 DIAGNOSIS — L089 Local infection of the skin and subcutaneous tissue, unspecified: Secondary | ICD-10-CM | POA: Diagnosis not present

## 2014-10-05 DIAGNOSIS — S93402A Sprain of unspecified ligament of left ankle, initial encounter: Secondary | ICD-10-CM | POA: Insufficient documentation

## 2014-10-05 DIAGNOSIS — I2699 Other pulmonary embolism without acute cor pulmonale: Secondary | ICD-10-CM | POA: Insufficient documentation

## 2014-10-05 DIAGNOSIS — Z86711 Personal history of pulmonary embolism: Secondary | ICD-10-CM | POA: Diagnosis not present

## 2014-10-05 DIAGNOSIS — Z86718 Personal history of other venous thrombosis and embolism: Secondary | ICD-10-CM | POA: Insufficient documentation

## 2014-10-05 DIAGNOSIS — Z8659 Personal history of other mental and behavioral disorders: Secondary | ICD-10-CM | POA: Diagnosis not present

## 2014-10-05 DIAGNOSIS — Y9389 Activity, other specified: Secondary | ICD-10-CM | POA: Diagnosis not present

## 2014-10-05 DIAGNOSIS — W228XXA Striking against or struck by other objects, initial encounter: Secondary | ICD-10-CM | POA: Diagnosis not present

## 2014-10-05 DIAGNOSIS — Z720 Tobacco use: Secondary | ICD-10-CM | POA: Insufficient documentation

## 2014-10-05 DIAGNOSIS — Z862 Personal history of diseases of the blood and blood-forming organs and certain disorders involving the immune mechanism: Secondary | ICD-10-CM | POA: Insufficient documentation

## 2014-10-05 DIAGNOSIS — S99922A Unspecified injury of left foot, initial encounter: Secondary | ICD-10-CM | POA: Diagnosis present

## 2014-10-05 HISTORY — DX: Acute embolism and thrombosis of unspecified deep veins of unspecified lower extremity: I82.409

## 2014-10-05 HISTORY — DX: Other pulmonary embolism without acute cor pulmonale: I26.99

## 2014-10-05 MED ORDER — HYDROCODONE-ACETAMINOPHEN 5-325 MG PO TABS: 2.0000 | ORAL_TABLET | ORAL | Status: DC | PRN

## 2014-10-05 MED ORDER — SULFAMETHOXAZOLE-TRIMETHOPRIM 800-160 MG PO TABS: 1.0000 | ORAL_TABLET | Freq: Two times a day (BID) | ORAL | Status: AC

## 2014-10-05 NOTE — ED Provider Notes (Signed)
CSN: [id number]     Arrival date & time 10/05/14  0815 History  This chart was scribed for [name] Schaumann, PA-C, working with Azalia Bilis, MD by Chestine Spore, ED Scribe. The patient was seen in room TR07C/TR07C at 8:53 AM.    Chief Complaint  Patient presents with  . Foot Pain      The history is provided by the patient. No language interpreter was used.    HPI Comments: SHAKEER GIORNO is a 34 y.o. male with a medical hx of DVT and PE who presents to the Emergency Department complaining of left foot pain onset 6 days ago. Pt is not on blood thinners but he voices that he should be. He denies injuring or twisting the foot, but he did hit his ankle on a recliner more than a month ago. He reports that since that incident he has had intermittent soreness. He states that he is having associated symptoms of sharp left ankle pain, color change, and joint swelling. He denies fever, chills, SOB, and any other symptoms. Pt denies having a PCP. 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  Pt reports that he is a smoker and that he is a social drinker. Denies any allergies to medications.    Past Medical History  Diagnosis Date  . Factor V Leiden mutation     With resultant hypercoaguable state, Hx of DVT, PE, SP IVC filter placement, on chronic coumadin and requires high doses to maintine IRN 2-3.  Marland Kitchen Anxiety and depression   . Pneumothorax 8/11    HX of, requiring chest tube/hospitalization.   . Venous stasis     bilateral LE.  Marland Kitchen DVT (deep venous thrombosis)     Pt unsure of date   . PE (pulmonary thromboembolism)     unsure of date   Past Surgical History  Procedure Laterality Date  . Placement of large-bore right chest tube  8/11  . Right minithoracotomy with evacuation of hematoma  8/09  . Placement of ivc filter  8/09   History reviewed. No pertinent family history. History  Substance Use Topics  . Smoking status: Current Every Day Smoker -- 1.50 packs/day    Types: Cigarettes  . Smokeless  tobacco: Not on file     Comment: Started smokin at age 36.   Marland Kitchen Alcohol Use: No     Comment: occasional.     Review of Systems  Constitutional: Negative for fever and chills.  Respiratory: Negative for shortness of breath.   Musculoskeletal: Positive for joint swelling and arthralgias.  Skin: Positive for color change.      Allergies  Review of patient's allergies indicates no known allergies.  Home Medications   Prior to Admission medications   Not on File   BP 128/69 mmHg  Pulse 71  Temp(Src) 97.5 F (36.4 C) (Oral)  Resp 18  Ht 6' (1.829 m)  Wt 230 lb (104.327 kg)  BMI 31.19 kg/m2  SpO2 97% Physical Exam  Constitutional: He is oriented to person, place, and time. He appears well-developed and well-nourished. No distress.  HENT:  Head: Normocephalic and atraumatic.  Eyes: EOM are normal.  Neck: Neck supple. No tracheal deviation present.  Cardiovascular: Normal rate.   Pulmonary/Chest: Effort normal. No respiratory distress.  Musculoskeletal: Normal range of motion.       Left ankle: He exhibits swelling.  Discolored feet. Left ankle swollen edematous, multiple small chronic appearing sores. Good pulses.   Neurological: He is alert and oriented to person, place,  and time.  Skin: Skin is warm and dry.  Psychiatric: He has a normal mood and affect. His behavior is normal.  Nursing note and vitals reviewed.   ED Course  Procedures (including critical care time) DIAGNOSTIC STUDIES: Oxygen Saturation is 97% on RA, nl by my interpretation.    COORDINATION OF CARE: 8:58 AM-Discussed treatment plan which includes left ankle x-ray with pt at bedside and pt agreed to plan.     Labs Review Labs Reviewed - No data to display  Imaging Review Dg Ankle Complete Left  10/05/2014   CLINICAL DATA:  Worsening left ankle pain without reported injury. History of vascular disease.  EXAM: LEFT ANKLE COMPLETE - 3+ VIEW  COMPARISON:  None.  FINDINGS: There is no evidence of  fracture, dislocation, or joint effusion. There is no evidence of arthropathy or other focal bone abnormality. Soft tissue swelling is seen over the medial malleolus suggesting ligamentous injury.  IMPRESSION: No fracture or dislocation is noted. Soft tissue swelling is seen over medial malleolus suggesting ligamentous injury.   Electronically Signed   By: Lupita Raider, M.D.   On: 10/05/2014 09:46     EKG Interpretation None      MDM  Pt advised of possible ligamentous injury. Pt placed in an aso.  Pt advised to see Orthopaedist for evaluation.   I will treat for skin infection.    Final diagnoses:  Sprain of ankle, left, initial encounter  Skin infection    Bactrim  aso Yates  I personally performed the services in this documentation, which was scribed in my presence.  The recorded information has been reviewed and considered.   Barnet Pall.  Lonia [place], PA-C 10/05/14 1013  Azalia Bilis, MD 10/05/14 1102

## 2014-10-05 NOTE — ED Notes (Signed)
Declined W/C at D/C and was escorted to lobby by RN. 

## 2014-10-05 NOTE — Discharge Instructions (Signed)
Ankle Sprain °An ankle sprain is an injury to the strong, fibrous tissues (ligaments) that hold the bones of your ankle joint together.  °CAUSES °An ankle sprain is usually caused by a fall or by twisting your ankle. Ankle sprains most commonly occur when you step on the outer edge of your foot, and your ankle turns inward. People who participate in sports are more prone to these types of injuries.  °SYMPTOMS  °· Pain in your ankle. The pain may be present at rest or only when you are trying to stand or walk. °· Swelling. °· Bruising. Bruising may develop immediately or within 1 to 2 days after your injury. °· Difficulty standing or walking, particularly when turning corners or changing directions. °DIAGNOSIS  °Your caregiver will ask you details about your injury and perform a physical exam of your ankle to determine if you have an ankle sprain. During the physical exam, your caregiver will press on and apply pressure to specific areas of your foot and ankle. Your caregiver will try to move your ankle in certain ways. An X-ray exam may be done to be sure a bone was not broken or a ligament did not separate from one of the bones in your ankle (avulsion fracture).  °TREATMENT  °Certain types of braces can help stabilize your ankle. Your caregiver can make a recommendation for this. Your caregiver may recommend the use of medicine for pain. If your sprain is severe, your caregiver may refer you to a surgeon who helps to restore function to parts of your skeletal system (orthopedist) or a physical therapist. °HOME CARE INSTRUCTIONS  °· Apply ice to your injury for 1-2 days or as directed by your caregiver. Applying ice helps to reduce inflammation and pain. °· Put ice in a plastic bag. °· Place a towel between your skin and the bag. °· Leave the ice on for 15-20 minutes at a time, every 2 hours while you are awake. °· Only take over-the-counter or prescription medicines for pain, discomfort, or fever as directed by  your caregiver. °· Elevate your injured ankle above the level of your heart as much as possible for 2-3 days. °· If your caregiver recommends crutches, use them as instructed. Gradually put weight on the affected ankle. Continue to use crutches or a cane until you can walk without feeling pain in your ankle. °· If you have a plaster splint, wear the splint as directed by your caregiver. Do not rest it on anything harder than a pillow for the first 24 hours. Do not put weight on it. Do not get it wet. You may take it off to take a shower or bath. °· You may have been given an elastic bandage to wear around your ankle to provide support. If the elastic bandage is too tight (you have numbness or tingling in your foot or your foot becomes cold and blue), adjust the bandage to make it comfortable. °· If you have an air splint, you may blow more air into it or let air out to make it more comfortable. You may take your splint off at night and before taking a shower or bath. Wiggle your toes in the splint several times per day to decrease swelling. °SEEK MEDICAL CARE IF:  °· You have rapidly increasing bruising or swelling. °· Your toes feel extremely cold or you lose feeling in your foot. °· Your pain is not relieved with medicine. °SEEK IMMEDIATE MEDICAL CARE IF: °· Your toes are numb or blue. °·   You have severe pain that is increasing. MAKE SURE YOU:   Understand these instructions.  Will watch your condition.  Will get help right away if you are not doing well or get worse. Document Released: 04/04/2005 Document Revised: 12/28/2011 Document Reviewed: 04/16/2011 [facility] Patient Information 2015 [place], [place]. This information is not intended to replace advice given to you by your health care provider. Make sure you discuss any questions you have with your health care provider. Wound Infection A wound infection happens when a type of germ (bacteria) starts growing in the wound. In some cases, this can cause  the wound to break open. If cared for properly, the infected wound will heal from the inside to the outside. Wound infections need treatment. CAUSES An infection is caused by bacteria growing in the wound.  SYMPTOMS   Increase in redness, swelling, or pain at the wound site.  Increase in drainage at the wound site.  Wound or bandage (dressing) starts to smell bad.  Fever.  Feeling tired or fatigued.  Pus draining from the wound. TREATMENT  Your health care provider will prescribe antibiotic medicine. The wound infection should improve within 24 to 48 hours. Any redness around the wound should stop spreading and the wound should be less painful.  HOME CARE INSTRUCTIONS   Only take over-the-counter or prescription medicines for pain, discomfort, or fever as directed by your health care provider.  Take your antibiotics as directed. Finish them even if you start to feel better.  Gently wash the area with mild soap and water 2 times a day, or as directed. Rinse off the soap. Pat the area dry with a clean towel. Do not rub the wound. This may cause bleeding.  Follow your health care provider's instructions for how often you need to change the dressing.  Apply ointment and a dressing to the wound as directed.  If the dressing sticks, moisten it with soapy water and gently remove it.  Change the bandage right away if it becomes wet, dirty, or develops a bad smell.  Take showers. Do not take tub baths, swim, or do anything that may soak the wound until it is healed.  Avoid exercises that make you sweat heavily.  Use anti-itch medicine as directed by your health care provider. The wound may itch when it is healing. Do not pick or scratch at the wound.  Follow up with your health care provider to get your wound rechecked as directed. SEEK MEDICAL CARE IF:  You have an increase in swelling, pain, or redness around the wound.  You have an increase in the amount of pus coming from the  wound.  There is a bad smell coming from the wound.  More of the wound breaks open.  You have a fever. MAKE SURE YOU:   Understand these instructions.  Will watch your condition.  Will get help right away if you are not doing well or get worse. Document Released: 01/01/2003 Document Revised: 04/09/2013 Document Reviewed: 08/08/2010 [facility] Patient Information 2015 [place], [place]. This information is not intended to replace advice given to you by your health care provider. Make sure you discuss any questions you have with your health care provider.

## 2014-10-05 NOTE — ED Notes (Signed)
Pt has returned from Radiology department 

## 2014-10-05 NOTE — ED Notes (Signed)
Pt reports a change in Pain on Monday 09-29-14. Pain located in Lt foot and ankle. Pt reports vascular DX . In legs and frequent DVT.

## 2015-06-16 ENCOUNTER — Encounter (HOSPITAL_COMMUNITY): Payer: Self-pay | Admitting: Cardiology

## 2015-06-16 ENCOUNTER — Emergency Department (HOSPITAL_COMMUNITY)
Admission: EM | Admit: 2015-06-16 | Discharge: 2015-06-16 | Disposition: A | Discharge status: Home / Self Care | Payer: BLUE CROSS/BLUE SHIELD | Attending: Emergency Medicine | Admitting: Emergency Medicine

## 2015-06-16 DIAGNOSIS — F17210 Nicotine dependence, cigarettes, uncomplicated: Secondary | ICD-10-CM | POA: Insufficient documentation

## 2015-06-16 DIAGNOSIS — M79605 Pain in left leg: Secondary | ICD-10-CM | POA: Diagnosis present

## 2015-06-16 DIAGNOSIS — Z8679 Personal history of other diseases of the circulatory system: Secondary | ICD-10-CM | POA: Diagnosis not present

## 2015-06-16 DIAGNOSIS — Z8709 Personal history of other diseases of the respiratory system: Secondary | ICD-10-CM | POA: Insufficient documentation

## 2015-06-16 DIAGNOSIS — Z8659 Personal history of other mental and behavioral disorders: Secondary | ICD-10-CM | POA: Diagnosis not present

## 2015-06-16 DIAGNOSIS — R600 Localized edema: Secondary | ICD-10-CM | POA: Diagnosis not present

## 2015-06-16 DIAGNOSIS — Z862 Personal history of diseases of the blood and blood-forming organs and certain disorders involving the immune mechanism: Secondary | ICD-10-CM | POA: Insufficient documentation

## 2015-06-16 DIAGNOSIS — Z86711 Personal history of pulmonary embolism: Secondary | ICD-10-CM | POA: Insufficient documentation

## 2015-06-16 DIAGNOSIS — Z86718 Personal history of other venous thrombosis and embolism: Secondary | ICD-10-CM | POA: Insufficient documentation

## 2015-06-16 LAB — PROTIME-INR
INR: 1.07 (ref 0.00–1.49)
Prothrombin Time: 14.1 seconds (ref 11.6–15.2)

## 2015-06-16 LAB — BASIC METABOLIC PANEL
ANION GAP: 14 (ref 5–15)
BUN: 9 mg/dL (ref 6–20)
CALCIUM: 9.8 mg/dL (ref 8.9–10.3)
CO2: 22 mmol/L (ref 22–32)
Chloride: 104 mmol/L (ref 101–111)
Creatinine, Ser: 0.96 mg/dL (ref 0.61–1.24)
GFR calc Af Amer: >60 mL/min (ref >60)
Glucose, Bld: 87 mg/dL (ref 65–99)
Potassium: 4 mmol/L (ref 3.5–5.1)
Sodium: 140 mmol/L (ref 135–145)

## 2015-06-16 LAB — CBC
HEMATOCRIT: 44.9 % (ref 39.0–52.0)
Hemoglobin: 15.6 g/dL (ref 13.0–17.0)
MCH: 31.8 pg (ref 26.0–34.0)
MCHC: 34.7 g/dL (ref 30.0–36.0)
MCV: 91.6 fL (ref 78.0–100.0)
PLATELETS: 206 10*3/uL (ref 150–400)
RBC: 4.9 MIL/uL (ref 4.22–5.81)
RDW: 13.2 % (ref 11.5–15.5)
WBC: 11.2 10*3/uL — AB (ref 4.0–10.5)

## 2015-06-16 MED ORDER — XARELTO VTE STARTER PACK 15 & 20 MG PO TBPK: 15.0000 mg | ORAL_TABLET | ORAL | Status: DC

## 2015-06-16 NOTE — ED Provider Notes (Signed)
CSN: [id number]     Arrival date & time 06/16/15  1527 History   First MD Initiated Contact with Patient 06/16/15 1751     Chief Complaint  Patient presents with  . Leg Pain     (Consider location/radiation/quality/duration/timing/severity/associated sxs/prior Treatment) Patient is a 35 y.o. male presenting with leg pain. The history is provided by the patient.  Leg Pain Location:  Leg Injury: no   Leg location:  L leg Pain details:    Quality:  Sharp   Radiates to:  Does not radiate   Severity:  Mild   Onset quality:  Sudden   Duration:  2 days   Timing:  Constant Chronicity:  Recurrent Dislocation: no   Prior injury to area:  No Relieved by:  Nothing Worsened by:  Nothing tried Ineffective treatments:  None tried Associated symptoms: no fever    35 yo M With a chief complaint of left leg pain. Patient has factor V Leiden and has a history of multiple prior blood clots. Patient is supposed to be on Coumadin chronically. He however has not been taking the medication for quite some time. He thinks that he is able to keep his blood clots from forming by being really active. However over the past weekend he was sitting for long periods of time and feels like that caused a clot. Patient has a Greenfield filter in. Denies chest pain or shortness of breath. Denies near-syncope.  Past Medical History  Diagnosis Date  . Factor V Leiden mutation (HCC)     With resultant hypercoaguable state, Hx of DVT, PE, SP IVC filter placement, on chronic coumadin and requires high doses to maintine IRN 2-3.  Marland Kitchen Anxiety and depression   . Pneumothorax 8/11    HX of, requiring chest tube/hospitalization.   . Venous stasis     bilateral LE.  Marland Kitchen DVT (deep venous thrombosis) (HCC)     Pt unsure of date   . PE (pulmonary thromboembolism) (HCC)     unsure of date   Past Surgical History  Procedure Laterality Date  . Placement of large-bore right chest tube  8/11  . Right minithoracotomy with  evacuation of hematoma  8/09  . Placement of ivc filter  8/09   History reviewed. No pertinent family history. Social History  Substance Use Topics  . Smoking status: Current Every Day Smoker -- 1.50 packs/day    Types: Cigarettes  . Smokeless tobacco: None     Comment: Started smokin at age 62.   Marland Kitchen Alcohol Use: No     Comment: occasional.     Review of Systems  Constitutional: Negative for fever and chills.  HENT: Negative for congestion and facial swelling.   Eyes: Negative for discharge and visual disturbance.  Respiratory: Negative for shortness of breath.   Cardiovascular: Positive for leg swelling. Negative for chest pain and palpitations.  Gastrointestinal: Negative for vomiting, abdominal pain and diarrhea.  Musculoskeletal: Positive for myalgias and arthralgias.  Skin: Negative for color change and rash.  Neurological: Negative for tremors, syncope and headaches.  Psychiatric/Behavioral: Negative for confusion and dysphoric mood.      Allergies  Review of patient's allergies indicates no known allergies.  Home Medications   Prior to Admission medications   Medication Sig Start Date End Date Taking? Authorizing Provider  HYDROcodone-acetaminophen (NORCO/VICODIN) 5-325 MG per tablet Take 2 tablets by mouth every 4 (four) hours as needed. 10/05/14   Lonia Skinner Sofia, PA-C  XARELTO STARTER PACK 15 & 20 MG  TBPK Take 15-20 mg by mouth as directed. Take as directed on package: Start with one  tablet by mouth twice a day with food. On Day 22, switch to one  tablet once a day with food. 06/16/15   Melene Plan, DO   BP 120/96 mmHg  Pulse 76  Temp(Src) 98.1 F (36.7 C) (Oral)  Resp 16  Wt 230 lb (104.327 kg)  SpO2 97% Physical Exam  Constitutional: He is oriented to person, place, and time. He appears well-developed and well-nourished.  HENT:  Head: Normocephalic and atraumatic.  Eyes: EOM are normal. Pupils are equal, round, and reactive to light.  Neck: Normal  range of motion. Neck supple. No JVD present.  Cardiovascular: Normal rate and regular rhythm.  Exam reveals no gallop and no friction rub.   No murmur heard. Pulmonary/Chest: No respiratory distress. He has no wheezes.  Abdominal: He exhibits no distension. There is no rebound and no guarding.  Musculoskeletal: Normal range of motion. He exhibits edema (3+ edema bilaterally appears chronic. Patient was some mild erythema to the left lower extremity just below the knee on the posterior portion. Pulse motor and sensation is intact distally.).  Neurological: He is alert and oriented to person, place, and time.  Skin: No rash noted. No pallor.  Psychiatric: He has a normal mood and affect. His behavior is normal.  Nursing note and vitals reviewed.   ED Course  Procedures (including critical care time) Labs Review Labs Reviewed  CBC - Abnormal; Notable for the following:    WBC 11.2 (*)    All other components within normal limits  BASIC METABOLIC PANEL  PROTIME-INR    Imaging Review No results found. I have personally reviewed and evaluated these images and lab results as part of my medical decision-making.   EKG Interpretation None      MDM   Final diagnoses:  Pain of left lower extremity    35 yo M with a chief complaint of leg pain. This may be a DVT patient is supposed to be on lifetime anticoagulation. We'll start him on Xarelto. PCP follow-up.  11:32 PM:  I have discussed the diagnosis/risks/treatment options with the patient and family and believe the pt to be eligible for discharge home to follow-up with PCP. We also discussed returning to the ED immediately if new or worsening sx occur. We discussed the sx which are most concerning (e.g., sudden chest pain, sob, syncope) that necessitate immediate return. Medications administered to the patient during their visit and any new prescriptions provided to the patient are listed below.  Medications given during this  visit Medications - No data to display  Discharge Medication List as of 06/16/2015  6:05 PM    START taking these medications   Details  XARELTO STARTER PACK 15 & 20 MG TBPK Take 15-20 mg by mouth as directed. Take as directed on package: Start with one  tablet by mouth twice a day with food. On Day 22, switch to one  tablet once a day with food., Starting 06/16/2015, Until Discontinued, Print        The patient appears reasonably screen and/or stabilized for discharge and I doubt any other medical condition or other [facility] requiring further screening, evaluation, or treatment in the ED at this time prior to discharge.     Melene Plan, DO 06/16/15 2332

## 2015-06-16 NOTE — ED Notes (Signed)
Pt reports he has a DVT in the left leg and is suppose to be on blood thinners for the past 11 years but has not been taking them. States the pain started a couple of days ago. No chest pain or SOB.

## 2015-06-16 NOTE — ED Notes (Signed)
Pt walking outside to smoke cigarette. This RN told pt that it wasn't a good idea to leave triage to smoke. Pt outside at this time.

## 2015-10-21 ENCOUNTER — Encounter (HOSPITAL_COMMUNITY): Payer: Self-pay | Admitting: Emergency Medicine

## 2015-10-21 ENCOUNTER — Emergency Department (HOSPITAL_COMMUNITY)
Admission: EM | Admit: 2015-10-21 | Discharge: 2015-10-21 | Disposition: A | Discharge status: Home / Self Care | Payer: BLUE CROSS/BLUE SHIELD | Attending: Emergency Medicine | Admitting: Emergency Medicine

## 2015-10-21 ENCOUNTER — Emergency Department (HOSPITAL_BASED_OUTPATIENT_CLINIC_OR_DEPARTMENT_OTHER)
Admit: 2015-10-21 | Discharge: 2015-10-21 | Disposition: A | Discharge status: Home / Self Care | Payer: BLUE CROSS/BLUE SHIELD | Attending: Emergency Medicine | Admitting: Emergency Medicine

## 2015-10-21 DIAGNOSIS — M79662 Pain in left lower leg: Secondary | ICD-10-CM

## 2015-10-21 DIAGNOSIS — R600 Localized edema: Secondary | ICD-10-CM | POA: Diagnosis not present

## 2015-10-21 DIAGNOSIS — Z86718 Personal history of other venous thrombosis and embolism: Secondary | ICD-10-CM | POA: Diagnosis not present

## 2015-10-21 DIAGNOSIS — F17210 Nicotine dependence, cigarettes, uncomplicated: Secondary | ICD-10-CM | POA: Insufficient documentation

## 2015-10-21 DIAGNOSIS — Z7901 Long term (current) use of anticoagulants: Secondary | ICD-10-CM | POA: Diagnosis not present

## 2015-10-21 DIAGNOSIS — M7989 Other specified soft tissue disorders: Secondary | ICD-10-CM | POA: Diagnosis present

## 2015-10-21 MED ORDER — RIVAROXABAN (XARELTO) VTE STARTER PACK (15 & 20 MG): 15.0000 mg | ORAL_TABLET | Freq: Every day | ORAL | Status: DC

## 2015-10-21 MED ORDER — RIVAROXABAN 15 MG PO TABS
15.0000 mg | ORAL_TABLET | Freq: Once | ORAL | Status: AC
  Administered 2015-10-21: 15 mg via ORAL
  Filled 2015-10-21 (×2): qty 1

## 2015-10-21 NOTE — Discharge Instructions (Signed)
It is very important for you to take your Xarelto as prescribed.  Return to the Emergency Department immediately if you have chest pain, shortness of breath, or coughing up blood.

## 2015-10-21 NOTE — ED Provider Notes (Signed)
CSN: [id number]     Arrival date & time 10/21/15  0606 History   First MD Initiated Contact with Patient 10/21/15 [id number]     Chief Complaint  Patient presents with  . Foot Swelling     (Consider location/radiation/quality/duration/timing/severity/associated sxs/prior Treatment) HPI Comments: Patient with a history of Factor V Leiden mutation, DVT, and PE s/p Greenfield filter presents today with LLE pain and swelling.  He states that he began having some swelling of the left lower leg three days ago, which has progressively worsened since that time.  He has elevated the leg, which helps some.  He is supposed to take chronic daily Xarelto, but states that he has not taken the medication in a couple of months.  He states that the medication is expensive.  He denies injury or trauma to the leg.  He denies fever, chills, SOB, cough, hemoptysis, or chest pain.  The history is provided by the patient.    Past Medical History  Diagnosis Date  . Factor V Leiden mutation (HCC)     With resultant hypercoaguable state, Hx of DVT, PE, SP IVC filter placement, on chronic coumadin and requires high doses to maintine IRN 2-3.  Marland Kitchen. Anxiety and depression   . Pneumothorax 8/11    HX of, requiring chest tube/hospitalization.   . Venous stasis     bilateral LE.  Marland Kitchen. DVT (deep venous thrombosis) (HCC)     Pt unsure of date   . PE (pulmonary thromboembolism) (HCC)     unsure of date   Past Surgical History  Procedure Laterality Date  . Placement of large-bore right chest tube  8/11  . Right minithoracotomy with evacuation of hematoma  8/09  . Placement of ivc filter  8/09   No family history on file. Social History  Substance Use Topics  . Smoking status: Current Every Day Smoker -- 1.50 packs/day    Types: Cigarettes  . Smokeless tobacco: None     Comment: Started smokin at age 35.   Marland Kitchen. Alcohol Use: No     Comment: occasional.     Review of Systems  All other systems reviewed and are  negative.     Allergies  Review of patient's allergies indicates no known allergies.  Home Medications   Prior to Admission medications   Medication Sig Start Date End Date Taking? Authorizing Provider  HYDROcodone-acetaminophen (NORCO/VICODIN) 5-325 MG per tablet Take 2 tablets by mouth every 4 (four) hours as needed. Patient not taking: Reported on 10/21/2015 10/05/14   Lonia SkinnerLeslie K Sofia, PA-C  XARELTO STARTER PACK 15 & 20 MG TBPK Take 15-20 mg by mouth as directed. Take as directed on package: Start with one 15mg  tablet by mouth twice a day with food. On Day 22, switch to one 20mg  tablet once a day with food. Patient not taking: Reported on 10/21/2015 06/16/15   Melene Planan Floyd, DO   BP 119/81 mmHg  Pulse 77  Temp(Src) 98.7 F (37.1 C) (Oral)  Resp 16  Ht 6' (1.829 m)  Wt 101.152 kg  BMI 30.24 kg/m2  SpO2 96% Physical Exam  Constitutional: He appears well-developed and well-nourished.  HENT:  Head: Normocephalic and atraumatic.  Mouth/Throat: Oropharynx is clear and moist.  Neck: Normal range of motion. Neck supple.  Cardiovascular: Normal rate, regular rhythm and normal heart sounds.   Pulses:      Dorsalis pedis pulses are 2+ on the right side, and 2+ on the left side.  Pulmonary/Chest: Effort normal and breath sounds  normal.  Musculoskeletal: Normal range of motion.  Edema of both lower extremities, L>R No erythema or warmth of lower extremities  Neurological: He is alert.  Skin: Skin is warm and dry.  Chronic venous stasis changes of both lower extremities  Psychiatric: He has a normal mood and affect.  Nursing note and vitals reviewed.   ED Course  Procedures (including critical care time) Labs Review Labs Reviewed - No data to display  Imaging Review No results found. I have personally reviewed and evaluated these images and lab results as part of my medical decision-making.   EKG Interpretation None      MDM   Final diagnoses:  None   Patient with a history  of Factor V Leiden Deficiency, DVT, and PE s/p Greenfield Filter presents today with LLE pain and swelling.  He is supposed to be on chronic anticoagulation with Xarelto, but has been off of it for several months for financial reasons.  No signs of cellulitis today.   Doppler ultrasound today showed chronic thrombus, but nothing no acute DVT.  He denies CP or SOB.  No hypoxia or tachycardia to indicate PE.  Feel that the patient is stable for discharge.  Case Management consulted and set the patient up with a savings program for Xarelto.  Return precautions given.      Santiago GladHeather Chanise Habeck, PA-C 10/22/15 2219  Gilda Creasehristopher J Pollina, MD 10/23/15 [phone]

## 2015-10-21 NOTE — Progress Notes (Signed)
Preliminary results by tech - Venous Duplex Lower Ext. Left Completed. No evidence of acute deep vein thrombosis in the left leg. There is evidence of chronic appearing thrombus in the distal femoral vein and popliteal vein and possible the peroneal veins, which was technically difficult to visualized. A short segment of the small saphenous vein demonstrated thrombus and a palpable superficial clot in the lateral ankle area. Results given to [place].  Marilynne Halstedita Caoimhe Damron, BS, RDMS, RVT

## 2015-10-21 NOTE — ED Notes (Signed)
Pt. reports left foot swelling onset Sunday this week , denies injury or SOB , he is concerned of blood clot . Respirations unlabored / denies fever or chills .

## 2015-12-02 ENCOUNTER — Emergency Department (HOSPITAL_COMMUNITY)
Admission: EM | Admit: 2015-12-02 | Discharge: 2015-12-03 | Disposition: A | Discharge status: Home / Self Care | Payer: BLUE CROSS/BLUE SHIELD | Source: Home / Self Care | Attending: Emergency Medicine | Admitting: Emergency Medicine

## 2015-12-02 ENCOUNTER — Encounter (HOSPITAL_COMMUNITY): Payer: Self-pay | Admitting: Emergency Medicine

## 2015-12-02 ENCOUNTER — Emergency Department (HOSPITAL_COMMUNITY): Payer: BLUE CROSS/BLUE SHIELD

## 2015-12-02 DIAGNOSIS — M25561 Pain in right knee: Secondary | ICD-10-CM

## 2015-12-02 DIAGNOSIS — Z79899 Other long term (current) drug therapy: Secondary | ICD-10-CM | POA: Diagnosis not present

## 2015-12-02 DIAGNOSIS — F17210 Nicotine dependence, cigarettes, uncomplicated: Secondary | ICD-10-CM

## 2015-12-02 DIAGNOSIS — Z7901 Long term (current) use of anticoagulants: Secondary | ICD-10-CM

## 2015-12-02 DIAGNOSIS — R1030 Lower abdominal pain, unspecified: Secondary | ICD-10-CM | POA: Diagnosis present

## 2015-12-02 DIAGNOSIS — R1032 Left lower quadrant pain: Secondary | ICD-10-CM | POA: Diagnosis not present

## 2015-12-02 NOTE — ED Notes (Signed)
Called for twice in subwait and also main lobby

## 2015-12-02 NOTE — ED Triage Notes (Signed)
Pt. 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

## 2015-12-03 ENCOUNTER — Emergency Department (HOSPITAL_COMMUNITY)
Admission: EM | Admit: 2015-12-03 | Discharge: 2015-12-03 | Disposition: A | Discharge status: Home / Self Care | Payer: BLUE CROSS/BLUE SHIELD | Attending: Emergency Medicine | Admitting: Emergency Medicine

## 2015-12-03 ENCOUNTER — Encounter (HOSPITAL_COMMUNITY): Payer: Self-pay | Admitting: Emergency Medicine

## 2015-12-03 ENCOUNTER — Emergency Department (HOSPITAL_COMMUNITY): Payer: BLUE CROSS/BLUE SHIELD

## 2015-12-03 DIAGNOSIS — Z79899 Other long term (current) drug therapy: Secondary | ICD-10-CM | POA: Insufficient documentation

## 2015-12-03 DIAGNOSIS — F17210 Nicotine dependence, cigarettes, uncomplicated: Secondary | ICD-10-CM | POA: Insufficient documentation

## 2015-12-03 DIAGNOSIS — Z7901 Long term (current) use of anticoagulants: Secondary | ICD-10-CM | POA: Insufficient documentation

## 2015-12-03 DIAGNOSIS — R1032 Left lower quadrant pain: Secondary | ICD-10-CM | POA: Insufficient documentation

## 2015-12-03 LAB — COMPREHENSIVE METABOLIC PANEL
ALK PHOS: 76 U/L (ref 38–126)
ALT: 32 U/L (ref 17–63)
AST: 26 U/L (ref 15–41)
Albumin: 4.3 g/dL (ref 3.5–5.0)
Anion gap: 6 (ref 5–15)
BUN: 12 mg/dL (ref 6–20)
CALCIUM: 9.6 mg/dL (ref 8.9–10.3)
CO2: 28 mmol/L (ref 22–32)
CREATININE: 1.09 mg/dL (ref 0.61–1.24)
Chloride: 103 mmol/L (ref 101–111)
GFR calc Af Amer: >60 mL/min (ref >60)
GFR calc non Af Amer: >60 mL/min (ref >60)
Glucose, Bld: 93 mg/dL (ref 65–99)
Potassium: 3.9 mmol/L (ref 3.5–5.1)
SODIUM: 137 mmol/L (ref 135–145)
Total Bilirubin: 0.6 mg/dL (ref 0.3–1.2)
Total Protein: 7.5 g/dL (ref 6.5–8.1)

## 2015-12-03 LAB — CBC
HCT: 45.4 % (ref 39.0–52.0)
Hemoglobin: 15.4 g/dL (ref 13.0–17.0)
MCH: 32.2 pg (ref 26.0–34.0)
MCHC: 33.9 g/dL (ref 30.0–36.0)
MCV: 95 fL (ref 78.0–100.0)
PLATELETS: 228 10*3/uL (ref 150–400)
RBC: 4.78 MIL/uL (ref 4.22–5.81)
RDW: 13.6 % (ref 11.5–15.5)
WBC: 11.9 10*3/uL — ABNORMAL HIGH (ref 4.0–10.5)

## 2015-12-03 LAB — URINALYSIS, ROUTINE W REFLEX MICROSCOPIC
Bilirubin Urine: NEGATIVE
Glucose, UA: NEGATIVE mg/dL
HGB URINE DIPSTICK: NEGATIVE
KETONES UR: NEGATIVE mg/dL
Leukocytes, UA: NEGATIVE
Nitrite: NEGATIVE
PROTEIN: NEGATIVE mg/dL
Specific Gravity, Urine: 1.029 (ref 1.005–1.030)
pH: 5 (ref 5.0–8.0)

## 2015-12-03 LAB — LIPASE, BLOOD: Lipase: 38 U/L (ref 11–51)

## 2015-12-03 MED ORDER — MORPHINE SULFATE (PF) 4 MG/ML IV SOLN
4.0000 mg | Freq: Once | INTRAVENOUS | Status: AC
  Administered 2015-12-03: 4 mg via INTRAVENOUS
  Filled 2015-12-03: qty 1

## 2015-12-03 MED ORDER — METRONIDAZOLE 500 MG PO TABS: 500.0000 mg | ORAL_TABLET | Freq: Two times a day (BID) | ORAL | 0 refills | Status: DC

## 2015-12-03 MED ORDER — HYDROCODONE-ACETAMINOPHEN 5-325 MG PO TABS: 2.0000 | ORAL_TABLET | ORAL | 0 refills | Status: DC | PRN

## 2015-12-03 MED ORDER — IOPAMIDOL (ISOVUE-300) INJECTION 61%
INTRAVENOUS | Status: AC
  Administered 2015-12-03: 100 mL
  Filled 2015-12-03: qty 100

## 2015-12-03 MED ORDER — CIPROFLOXACIN HCL 500 MG PO TABS: 500.0000 mg | ORAL_TABLET | Freq: Two times a day (BID) | ORAL | 0 refills | Status: DC

## 2015-12-03 MED ORDER — ONDANSETRON HCL 4 MG/2ML IJ SOLN
4.0000 mg | Freq: Once | INTRAMUSCULAR | Status: AC
  Administered 2015-12-03: 4 mg via INTRAVENOUS
  Filled 2015-12-03: qty 2

## 2015-12-03 MED ORDER — ACETAMINOPHEN 500 MG PO TABS: 500.0000 mg | ORAL_TABLET | Freq: Four times a day (QID) | ORAL | 0 refills | Status: DC | PRN

## 2015-12-03 NOTE — ED Provider Notes (Signed)
[facility]EMERGENCY DEPT Provider Note   CSN: [id number] Arrival date & time: 12/03/15  0134     History   Chief Complaint Chief Complaint  Patient presents with  . Abdominal Pain    HPI [name] [name] is a 35 y.o. male.  Patient presents to the emergency department with chief complaint of lower abdominal pain. Patient states that the symptoms started about one week ago. Reports prior history of diverticulitis.  States that he has had some associated nausea and vomiting.  He denies any diarrhea or blood in stool.  Reports that the pain is moderate.  States that this feels the same as his last episode of diverticulitis.  Was seen last night for knee pain, but states that when he mentioned his abdominal pain, he was told that he'd have to check back in for this.   The history is provided by the patient. No language interpreter was used.    Past Medical History:  Diagnosis Date  . Anxiety and depression   . DVT (deep venous thrombosis) (HCC)    Pt unsure of date   . Factor V Leiden mutation (HCC)    With resultant hypercoaguable state, Hx of DVT, PE, SP IVC filter placement, on chronic coumadin and requires high doses to maintine IRN 2-3.  . PE (pulmonary thromboembolism) (HCC)    unsure of date  . Pneumothorax 8/11   HX of, requiring chest tube/hospitalization.   . Venous stasis    bilateral LE.    Patient Active Problem List   Diagnosis Date Noted  . DVT (deep venous thrombosis) (HCC)   . PE (pulmonary thromboembolism) (HCC)   . Long term current use of anticoagulant 05/09/2010  . Dysthymic disorder 11/24/2009  . ULCER OF OTHER PART OF LOWER LIMB 10/28/2008  . PRIMARY HYPERCOAGULABLE STATE 12/18/2007  . DVT 12/13/2007    Past Surgical History:  Procedure Laterality Date  . placement of IVC filter  8/09  . Placement of large-bore right chest tube  8/11  . right minithoracotomy with evacuation of hematoma  8/09       Home Medications    Prior to Admission  medications   Medication Sig Start Date End Date Taking? Authorizing Provider  acetaminophen (TYLENOL) 500 MG tablet Take 1 tablet (500 mg total) by mouth every 6 (six) hours as needed. 12/03/15   Cheri Fowler, PA-C  HYDROcodone-acetaminophen (NORCO/VICODIN) 5-325 MG per tablet Take 2 tablets by mouth every 4 (four) hours as needed. Patient not taking: Reported on 10/21/2015 10/05/14   Elson Areas, PA-C  Rivaroxaban 15 & 20 MG TBPK Take 15-20 mg by mouth daily. Take as directed on package: Start with one 15mg  tablet by mouth twice a day with food. On Day 22, switch to one 20mg  tablet once a day with food. 10/21/15   Santiago Glad, PA-C    Family History No family history on file.  Social History Social History  Substance Use Topics  . Smoking status: Current Every Day Smoker    Packs/day: 1.50    Types: Cigarettes  . Smokeless tobacco: Not on file     Comment: Started smokin at age 40.   Marland Kitchen Alcohol use Yes     Comment: occasional.      Allergies   Review of patient's allergies indicates no known allergies.   Review of Systems Review of Systems  Gastrointestinal: Positive for abdominal pain.  All other systems reviewed and are negative.    Physical Exam Updated Vital Signs BP 138/83  Pulse 73   Resp 19   Ht 6' (1.829 m)   Wt 106.6 kg   SpO2 99%   BMI 31.87 kg/m   Physical Exam  Constitutional: He is oriented to person, place, and time. He appears well-developed and well-nourished.  HENT:  Head: Normocephalic and atraumatic.  Eyes: Conjunctivae and EOM are normal. Pupils are equal, round, and reactive to light. Right eye exhibits no discharge. Left eye exhibits no discharge. No scleral icterus.  Neck: Normal range of motion. Neck supple. No JVD present.  Cardiovascular: Normal rate, regular rhythm and normal heart sounds.  Exam reveals no gallop and no friction rub.   No murmur heard. Pulmonary/Chest: Effort normal and breath sounds normal. No respiratory distress. He  has no wheezes. He has no rales. He exhibits no tenderness.  Abdominal: Soft. He exhibits no distension and no mass. There is tenderness. There is no rebound and no guarding.  ttp of bilateral lower abdominal quandrants  Musculoskeletal: Normal range of motion. He exhibits no edema or tenderness.  Neurological: He is alert and oriented to person, place, and time.  Skin: Skin is warm and dry.  Psychiatric: He has a normal mood and affect. His behavior is normal. Judgment and thought content normal.  Nursing note and vitals reviewed.    ED Treatments / Results  Labs (all labs ordered are listed, but only abnormal results are displayed) Labs Reviewed  CBC - Abnormal; Notable for the following:       Result Value   WBC 11.9 (*)    All other components within normal limits  LIPASE, BLOOD  COMPREHENSIVE METABOLIC PANEL  URINALYSIS, ROUTINE W REFLEX MICROSCOPIC (NOT AT [facility])    EKG  EKG Interpretation None       Radiology Ct Abdomen Pelvis W Contrast  Result Date: 12/03/2015 CLINICAL DATA:  One week history of lower abdominal pain with nausea and vomiting EXAM: CT ABDOMEN AND PELVIS WITH CONTRAST TECHNIQUE: Multidetector CT imaging of the abdomen and pelvis was performed using the standard protocol following bolus administration of intravenous contrast. CONTRAST:  100mL ISOVUE-300 IOPAMIDOL (ISOVUE-300) INJECTION 61% COMPARISON:  November 19, 2007 FINDINGS: Lower chest: There are areas of scarring and patchy atelectasis in both lung bases. No airspace consolidation is evident in the visualized lung bases. Hepatobiliary: There is hepatic steatosis. No focal liver lesions are evident. The gallbladder wall is not appreciably thickened. There is no biliary duct dilatation. Pancreas: There is no pancreatic mass or inflammatory focus. Spleen: No splenic lesions are evident. Adrenals/Urinary Tract: Adrenals appear unremarkable bilaterally. Kidneys bilaterally show no evidence of mass or  hydronephrosis on either side. There is no renal or ureteral calculus on either side. Urinary bladder is midline with wall thickness within normal limits. Stomach/Bowel: There are scattered colonic diverticula without diverticulitis. There is no appreciable bowel wall or mesenteric thickening. There is no bowel obstruction. No free air or portal venous air. Vascular/Lymphatic: There is a filter in the inferior vena cava. Several of the struts of the inferior vena cava filter extend beyond the lumen of the inferior vena cava. There is no pericaval fluid. There is no abdominal aortic aneurysm. No arterial vascular lesions are evident. There are multiple subcentimeter retroperitoneal lymph nodes. There is a focal lymph node medial to the mid kidney in the retroperitoneum to the left of the aorta measuring at 2.1 x 1.4 cm. There is a lymph node located between the aorta and inferior vena cava measuring 1.4 x 1.1 cm. There are several subcentimeter  inguinal lymph nodes as well. Reproductive: Prostate and seminal vesicles appear normal. There is no pelvic mass or pelvic fluid collection. Other: Appendix appears normal. There is no abscess or ascites in the abdomen or pelvis. Musculoskeletal: There is degenerative change in the lower thoracic and lumbar spine. There are no blastic or lytic bone lesions. There is no intramuscular or abdominal wall lesion evident. IMPRESSION: Mild retroperitoneal adenopathy of uncertain etiology. Scattered smaller lymph nodes are noted in the retroperitoneum and inguinal regions as well. There is an inferior vena cava filter present with several struts extending beyond the lumen of the inferior vena cava. No pericaval fluid is evident. Hepatic steatosis. Scattered colonic diverticula without diverticulitis. No bowel obstruction. No abscess. Appendix appears normal. No renal or ureteral calculus.  No hydronephrosis. Electronically Signed   By: Bretta BangWilliam  Woodruff III M.D.   On: 12/03/2015 08:27    Dg Knee Complete 4 Views Right  Result Date: 12/02/2015 CLINICAL DATA:  Initial evaluation for acute pain, swelling. EXAM: RIGHT KNEE - COMPLETE 4+ VIEW COMPARISON:  None. FINDINGS: No acute fracture or dislocation. No joint effusion. Mild degenerative osteoarthritic changes within the patellofemoral and medial femorotibial joints noted. Osseous mineralization normal. No acute soft tissue abnormality. IMPRESSION: 1. No acute osseous abnormality about the knee. 2. Mild degenerative osteoarthritic changes at the medial femorotibial and patellofemoral joint space compartments. Electronically Signed   By: Rise MuBenjamin  McClintock M.D.   On: 12/02/2015 23:12    Procedures Procedures (including critical care time)  Medications Ordered in ED Medications  morphine 4 MG/ML injection 4 mg (not administered)  ondansetron (ZOFRAN) injection 4 mg (not administered)     Initial Impression / Assessment and Plan / ED Course  I have reviewed the triage vital signs and the nursing notes.  Pertinent labs & imaging results that were available during my care of the patient were reviewed by me and considered in my medical decision making (see chart for details).  Clinical Course    Patient with lower abdominal pain. History of diverticulitis. States that he was seen last night, but that when he mentioned his abdominal pain, is told that he'll have to check back in. Patient did so. I will see if the visits can be merged, because he states that he did mention his abdominal pain.  CT scan today shows some retroperitoneal lymphadenopathy. It is unclear that etiology of this. Patient states this feels similar to his previous diverticulitis, though there is not evidence of diverticulitis on the CT. I will give the patient some pain medicine and Cipro and Flagyl to treat any diverticulitis that may be developing.  He is tolerating PO.  Feels improved.  Stable for discharge.  Discussed with charge nurse about merging  visits.  Final Clinical Impressions(s) / ED Diagnoses   Final diagnoses:  Left lower quadrant pain    New Prescriptions New Prescriptions   CIPROFLOXACIN (CIPRO) 500 MG TABLET    Take 1 tablet (500 mg total) by mouth 2 (two) times daily.   HYDROCODONE-ACETAMINOPHEN (NORCO/VICODIN) 5-325 MG TABLET    Take 2 tablets by mouth every 4 (four) hours as needed.   METRONIDAZOLE (FLAGYL) 500 MG TABLET    Take 1 tablet (500 mg total) by mouth 2 (two) times daily.     Roxy Horsemanobert Irelynd Zumstein, PA-C 12/03/15 [id number]    Shon Batonourtney F Horton, MD 12/07/15 [phone]

## 2015-12-03 NOTE — ED Provider Notes (Signed)
[facility]PT Provider Note   CSN: [id number] Arrival date & time: 12/02/15  2235     History   Chief Complaint Chief Complaint  Patient presents with  . Knee Pain    HPI [name] [name] is a 35 y.o. male.  HPI [name] [name] is a 35 y.o. male with PMH significant for anxiety, DVT, PE with IVC and Coumadin, Factor 5 Leiden mutation who presents with Constant, moderate, persistent right medial knee pain. Patient reports he stretched on Father's Day and has been having pain since. He states he felt like his knee just collapsed. He has not taken anything for his pain. He is ambulatory. He is on Xarelto. Worse after long days at work.  He denies any fever, swelling, erythema, numbness, or weakness.  Past Medical History:  Diagnosis Date  . Anxiety and depression   . DVT (deep venous thrombosis) (HCC)    Pt unsure of date   . Factor V Leiden mutation (HCC)    With resultant hypercoaguable state, Hx of DVT, PE, SP IVC filter placement, on chronic coumadin and requires high doses to maintine IRN 2-3.  . PE (pulmonary thromboembolism) (HCC)    unsure of date  . Pneumothorax 8/11   HX of, requiring chest tube/hospitalization.   . Venous stasis    bilateral LE.    Patient Active Problem List   Diagnosis Date Noted  . DVT (deep venous thrombosis) (HCC)   . PE (pulmonary thromboembolism) (HCC)   . Long term current use of anticoagulant 05/09/2010  . Dysthymic disorder 11/24/2009  . ULCER OF OTHER PART OF LOWER LIMB 10/28/2008  . PRIMARY HYPERCOAGULABLE STATE 12/18/2007  . DVT 12/13/2007    Past Surgical History:  Procedure Laterality Date  . placement of IVC filter  8/09  . Placement of large-bore right chest tube  8/11  . right minithoracotomy with evacuation of hematoma  8/09       Home Medications    Prior to Admission medications   Medication Sig Start Date End Date Taking? Authorizing Provider  acetaminophen (TYLENOL) 500 MG tablet Take 1 tablet (500 mg  total) by mouth every 6 (six) hours as needed. 12/03/15   Cheri FowlerKayla Eulogio Requena, PA-C  HYDROcodone-acetaminophen (NORCO/VICODIN) 5-325 MG per tablet Take 2 tablets by mouth every 4 (four) hours as needed. Patient not taking: Reported on 10/21/2015 10/05/14   Elson AreasLeslie K Sofia, PA-C  Rivaroxaban 15 & 20 MG TBPK Take 15-20 mg by mouth daily. Take as directed on package: Start with one 15mg  tablet by mouth twice a day with food. On Day 22, switch to one 20mg  tablet once a day with food. 10/21/15   Santiago GladHeather Laisure, PA-C    Family History No family history on file.  Social History Social History  Substance Use Topics  . Smoking status: Current Every Day Smoker    Packs/day: 1.50    Types: Cigarettes  . Smokeless tobacco: Not on file     Comment: Started smokin at age 35.   Marland Kitchen. Alcohol use Yes     Comment: occasional.      Allergies   Review of patient's allergies indicates no known allergies.   Review of Systems Review of Systems   Physical Exam Updated Vital Signs BP 134/87 (BP Location: Right Arm)   Pulse 102   Temp 98.6 F (37 C) (Oral)   Resp 20   Wt 106.9 kg   SpO2 96%   BMI 31.95 kg/m   Physical Exam  Constitutional: He  is oriented to person, place, and time. He appears well-developed and well-nourished.  HENT:  Head: Normocephalic and atraumatic.  Right Ear: External ear normal.  Left Ear: External ear normal.  Eyes: Conjunctivae are normal. No scleral icterus.  Neck: No tracheal deviation present.  Cardiovascular:  Pulses:      Dorsalis pedis pulses are 2+ on the right side, and 2+ on the left side.  No unilateral lower extremity edema.  Pulmonary/Chest: Effort normal. No respiratory distress.  Abdominal: He exhibits no distension.  Musculoskeletal: Normal range of motion.  Left knee without swelling, erythema, ecchymoses, or deformity. He is tender over the MCL. No ligamentous laxity. Mild pain with valgus stress. Full active range of motion.  Neurological: He is alert and  oriented to person, place, and time.  Strength and sensation intact.  Skin: Skin is warm and dry.  Psychiatric: He has a normal mood and affect. His behavior is normal.   All other systems negative unless otherwise stated in HPI   ED Treatments / Results  Labs (all labs ordered are listed, but only abnormal results are displayed) Labs Reviewed - No data to display  EKG  EKG Interpretation None       Radiology Dg Knee Complete 4 Views Right  Result Date: 12/02/2015 CLINICAL DATA:  Initial evaluation for acute pain, swelling. EXAM: RIGHT KNEE - COMPLETE 4+ VIEW COMPARISON:  None. FINDINGS: No acute fracture or dislocation. No joint effusion. Mild degenerative osteoarthritic changes within the patellofemoral and medial femorotibial joints noted. Osseous mineralization normal. No acute soft tissue abnormality. IMPRESSION: 1. No acute osseous abnormality about the knee. 2. Mild degenerative osteoarthritic changes at the medial femorotibial and patellofemoral joint space compartments. Electronically Signed   By: Rise MuBenjamin  McClintock M.D.   On: 12/02/2015 23:12    Procedures Procedures (including critical care time)  Medications Ordered in ED Medications - No data to display   Initial Impression / Assessment and Plan / ED Course  I have reviewed the triage vital signs and the nursing notes.  Pertinent labs & imaging results that were available during my care of the patient were reviewed by me and considered in my medical decision making (see chart for details).  Clinical Course   Patient X-Ray negative for obvious fracture or dislocation.  Do not suspect DVT.  Pt advised to follow up with orthopedics. Conservative therapy recommended and discussed. Patient will be discharged home & is agreeable with above plan. Returns precautions discussed. Pt appears safe for discharge.    Final Clinical Impressions(s) / ED Diagnoses   Final diagnoses:  Right knee pain    New  Prescriptions New Prescriptions   ACETAMINOPHEN (TYLENOL) 500 MG TABLET    Take 1 tablet (500 mg total) by mouth every 6 (six) hours as needed.     Cheri FowlerKayla Melayah Skorupski, PA-C 12/03/15 0107    Layla MawKristen N Ward, DO 12/03/15 [id number]

## 2015-12-03 NOTE — ED Notes (Signed)
Patient Alert and oriented X4. Stable and ambulatory. Patient verbalized understanding of the discharge instructions.  Patient belongings were taken by the patient.  

## 2015-12-03 NOTE — ED Triage Notes (Signed)
Pt. reports low abdominal pain with emesis and diarrhea onset last week . Denies fever or chills.

## 2015-12-03 NOTE — ED Notes (Signed)
Patient is A&Ox4 at this time.  Patient in no signs of distress.  Please see providers note for complete history and physical exam.  

## 2015-12-03 NOTE — ED Notes (Signed)
Pt informed that he cannot have any coffee at this time; understanding of this. No needs.

## 2016-07-08 ENCOUNTER — Other Ambulatory Visit: Payer: Self-pay

## 2016-07-08 ENCOUNTER — Emergency Department (HOSPITAL_BASED_OUTPATIENT_CLINIC_OR_DEPARTMENT_OTHER)
Admit: 2016-07-08 | Discharge: 2016-07-08 | Disposition: A | Discharge status: Home / Self Care | Payer: BLUE CROSS/BLUE SHIELD | Attending: Vascular Surgery | Admitting: Vascular Surgery

## 2016-07-08 ENCOUNTER — Emergency Department (HOSPITAL_BASED_OUTPATIENT_CLINIC_OR_DEPARTMENT_OTHER)
Admit: 2016-07-08 | Discharge: 2016-07-08 | Disposition: A | Discharge status: Home / Self Care | Payer: BLUE CROSS/BLUE SHIELD

## 2016-07-08 ENCOUNTER — Emergency Department (HOSPITAL_COMMUNITY)
Admission: EM | Admit: 2016-07-08 | Discharge: 2016-07-10 | Disposition: A | Discharge status: Home / Self Care | Payer: BLUE CROSS/BLUE SHIELD | Source: Home / Self Care | Attending: Emergency Medicine | Admitting: Emergency Medicine

## 2016-07-08 ENCOUNTER — Encounter (HOSPITAL_COMMUNITY): Payer: Self-pay | Admitting: Emergency Medicine

## 2016-07-08 DIAGNOSIS — I82411 Acute embolism and thrombosis of right femoral vein: Secondary | ICD-10-CM | POA: Insufficient documentation

## 2016-07-08 DIAGNOSIS — Z79899 Other long term (current) drug therapy: Secondary | ICD-10-CM

## 2016-07-08 DIAGNOSIS — M79609 Pain in unspecified limb: Secondary | ICD-10-CM

## 2016-07-08 DIAGNOSIS — M79604 Pain in right leg: Secondary | ICD-10-CM | POA: Diagnosis not present

## 2016-07-08 DIAGNOSIS — F17210 Nicotine dependence, cigarettes, uncomplicated: Secondary | ICD-10-CM

## 2016-07-08 DIAGNOSIS — I82402 Acute embolism and thrombosis of unspecified deep veins of left lower extremity: Secondary | ICD-10-CM | POA: Diagnosis not present

## 2016-07-08 DIAGNOSIS — D6859 Other primary thrombophilia: Secondary | ICD-10-CM | POA: Diagnosis not present

## 2016-07-08 DIAGNOSIS — T82868A Thrombosis of vascular prosthetic devices, implants and grafts, initial encounter: Secondary | ICD-10-CM | POA: Diagnosis not present

## 2016-07-08 LAB — I-STAT CHEM 8, ED
BUN: 18 mg/dL (ref 6–20)
Calcium, Ion: 1.18 mmol/L (ref 1.15–1.40)
Chloride: 107 mmol/L (ref 101–111)
Creatinine, Ser: 0.9 mg/dL (ref 0.61–1.24)
GLUCOSE: 93 mg/dL (ref 65–99)
HCT: 45 % (ref 39.0–52.0)
Hemoglobin: 15.3 g/dL (ref 13.0–17.0)
Potassium: 3.8 mmol/L (ref 3.5–5.1)
SODIUM: 141 mmol/L (ref 135–145)
TCO2: 24 mmol/L (ref 0–100)

## 2016-07-08 MED ORDER — AMOXICILLIN-POT CLAVULANATE 875-125 MG PO TABS: 1.0000 | ORAL_TABLET | Freq: Two times a day (BID) | ORAL | 0 refills | Status: DC

## 2016-07-08 MED ORDER — RIVAROXABAN 15 MG PO TABS
15.0000 mg | ORAL_TABLET | Freq: Once | ORAL | Status: AC
  Administered 2016-07-08: 15 mg via ORAL
  Filled 2016-07-08: qty 1

## 2016-07-08 MED ORDER — AMOXICILLIN-POT CLAVULANATE 875-125 MG PO TABS
1.0000 | ORAL_TABLET | Freq: Once | ORAL | Status: AC
  Administered 2016-07-08: 1 via ORAL
  Filled 2016-07-08: qty 1

## 2016-07-08 MED ORDER — HEPARIN BOLUS VIA INFUSION
5500.0000 [IU] | Freq: Once | INTRAVENOUS | Status: DC
  Filled 2016-07-08: qty 5500

## 2016-07-08 MED ORDER — RIVAROXABAN (XARELTO) VTE STARTER PACK (15 & 20 MG): ORAL_TABLET | ORAL | 0 refills | Status: DC

## 2016-07-08 MED ORDER — HEPARIN (PORCINE) IN NACL 100-0.45 UNIT/ML-% IJ SOLN
1700.0000 [IU]/h | INTRAMUSCULAR | Status: DC
  Filled 2016-07-08: qty 250

## 2016-07-08 NOTE — Progress Notes (Signed)
ANTICOAGULATION CONSULT NOTE - Initial Consult  Pharmacy Consult for heparin Indication: DVT  No Known Allergies  Patient Measurements: Height: 6' (182.9 cm) Weight: 250 lb (113.4 kg) IBW/kg (Calculated) : 77.6 Heparin Dosing Weight: 101kg  Vital Signs: Temp: 97.8 F (36.6 C) (03/23 0523) BP: 126/72 (03/23 1300) Pulse Rate: 58 (03/23 1300)  Labs:  Recent Labs  07/08/16 0702  HGB 15.3  HCT 45.0  CREATININE 0.90    Estimated Creatinine Clearance: 144.7 mL/min (by C-G formula based on SCr of 0.9 mg/dL).   Medical History: Past Medical History:  Diagnosis Date  . Anxiety and depression   . DVT (deep venous thrombosis) (HCC)    Pt unsure of date   . Factor V Leiden mutation (HCC)    With resultant hypercoaguable state, Hx of DVT, PE, SP IVC filter placement, on chronic coumadin and requires high doses to maintine IRN 2-3.  . PE (pulmonary thromboembolism) (HCC)    unsure of date  . Pneumothorax 8/11   HX of, requiring chest tube/hospitalization.   . Venous stasis    bilateral LE.    Medications:  Infusions:  . heparin      Assessment: 36 yom presented to the ED with recurrent DVT. Also with a history of PE. Pt stopped taking xarelto. Had been on warfarin in the past. Baseline H/H WNL.   Goal of Therapy:  Heparin level 0.3-0.7 units/ml Monitor platelets by anticoagulation protocol: Yes   Plan:  Heparin bolus 5500 units IV x 1 Heparin gtt 1700 units/hr Check a 6 hr heparin level Daily heparin level and CBC  Herson Prichard, Drake Leachachel Lynn 07/08/2016,2:10 PM

## 2016-07-08 NOTE — ED Notes (Signed)
Pt states he understands instructions. Home with family. Stable with steady gait.

## 2016-07-08 NOTE — Discharge Instructions (Signed)
Take the prescribed medication as directed.  It is extremely important that you take these blood thinners as prescribed. Follow-up with Dr. Randie Heinzain on Monday for your procedure. Return to the ED for new or worsening symptoms.

## 2016-07-08 NOTE — ED Triage Notes (Signed)
Pt states that he is Protein B deficient sand he has been off his medications for months now. He has some meds but stopped taking them and did not Pt is supposed to be taking Xarelto. Pt stopped it prior to getting on the 20. Feels the discomfort int he R leg. Pt states it feels like his previous blood clots. States this happened after passing out in a chair. Currently pain free unless touched then rates pain 9/10.

## 2016-07-08 NOTE — Progress Notes (Signed)
A young lady answered the phone and stated that I had the wrong number. I verified the number dialed; telephone number is incorrect. A voice message was left on emergency contact voice mailbox .

## 2016-07-08 NOTE — ED Notes (Signed)
Gave pt water to drink per Dr. Dalene SeltzerSchlossman and Aldean JewettMillie, RN

## 2016-07-08 NOTE — ED Notes (Signed)
Unable to sign. Pt and family both states they uinderstandsinstructions.

## 2016-07-08 NOTE — Progress Notes (Signed)
*  Preliminary Results* Bilateral lower extremity venous duplex completed. The right lower extremity is positive for acute deep vein thrombosis involving the right saphenofemoral junction, common femoral, femoral, and popliteal veins. There is evidence of acute superficial vein thrombosis involving the right greater saphenous vein. There is evidence of a small segment of chronic deep vein thrombosis involving the left mid femoral vein. There is no evidence of acute deep vein thrombosis involving the left lower extremity. No evidence of Baker's cyst bilaterally.  Preliminary results discussed with Dr. Dalene SeltzerSchlossman.  07/08/2016 8:52 AM Gertie FeyMichelle Sharis Keeran, BS, RVT, RDCS, RDMS

## 2016-07-08 NOTE — ED Notes (Signed)
Pt is tearful and states that he cannot have procedure without seeing his son. Explained to pt that the plan was to go today to procedure. Pt ask to speak with Dr. Randie Heinzain. Same pagged.

## 2016-07-08 NOTE — ED Provider Notes (Signed)
[facility]EMER[facility]ENCY DEPT Provider Note   CSN: [id number] Arrival date & time: 07/08/16  7829     History   Chief Complaint No chief complaint on file.   HPI [name] [name] is a 36 y.o. male.  The history is provided by the patient and medical records.   36 year old male with a PMH of factor V leiden, PE, and multiple DVTs who is s/p IVC filter placement 9-10 years ago presents today complaining of a recurrent right lower extremity clot. He states that the clot started forming about a week ago after he fell asleep in a chair. Since the time, the patient reports that the medial aspect of his thigh just above the knee has become more erythematous and painful to the touch. Last anticoagulation was with Xarelto about 6 months ago. Previously took Coumadin. Attempts to control clot formation with activity noting he walks between 1-2 miles every day for work and tries to avoid sedentary periods. Denies dizziness, syncope, SOB, DOE, CP, abdominal pain, and N/V/D.    Past Medical History:  Diagnosis Date  . Anxiety and depression   . DVT (deep venous thrombosis) (HCC)    Pt unsure of date   . Factor V Leiden mutation (HCC)    With resultant hypercoaguable state, Hx of DVT, PE, SP IVC filter placement, on chronic coumadin and requires high doses to maintine IRN 2-3.  . PE (pulmonary thromboembolism) (HCC)    unsure of date  . Pneumothorax 8/11   HX of, requiring chest tube/hospitalization.   . Venous stasis    bilateral LE.    Patient Active Problem List   Diagnosis Date Noted  . DVT (deep venous thrombosis) (HCC)   . PE (pulmonary thromboembolism) (HCC)   . Long term current use of anticoagulant 05/09/2010  . Dysthymic disorder 11/24/2009  . ULCER OF OTHER PART OF LOWER LIMB 10/28/2008  . PRIMARY HYPERCOAGULABLE STATE 12/18/2007  . DVT 12/13/2007    Past Surgical History:  Procedure Laterality Date  . placement of IVC filter  8/09  . Placement of large-bore right chest tube  8/11    . right minithoracotomy with evacuation of hematoma  8/09       Home Medications    Prior to Admission medications   Medication Sig Start Date End Date Taking? Authorizing Provider  acetaminophen (TYLENOL) 500 MG tablet Take 1 tablet (500 mg total) by mouth every 6 (six) hours as needed. Patient not taking: Reported on 12/03/2015 12/03/15   Cheri Fowler, PA-C  ciprofloxacin (CIPRO) 500 MG tablet Take 1 tablet (500 mg total) by mouth 2 (two) times daily. Patient not taking: Reported on 07/08/2016 12/03/15   Roxy Horseman, PA-C  HYDROcodone-acetaminophen (NORCO/VICODIN) 5-325 MG tablet Take 2 tablets by mouth every 4 (four) hours as needed. Patient not taking: Reported on 07/08/2016 12/03/15   Roxy Horseman, PA-C  metroNIDAZOLE (FLAGYL) 500 MG tablet Take 1 tablet (500 mg total) by mouth 2 (two) times daily. Patient not taking: Reported on 07/08/2016 12/03/15   Roxy Horseman, PA-C  Rivaroxaban 15 & 20 MG TBPK Take 15-20 mg by mouth daily. Take as directed on package: Start with one 15mg  tablet by mouth twice a day with food. On Day 22, switch to one 20mg  tablet once a day with food. Patient not taking: Reported on 07/08/2016 10/21/15   Santiago Glad, PA-C    Family History No family history on file.  Social History Social History  Substance Use Topics  . Smoking status: Current Every Day Smoker  Packs/day: 1.50    Types: Cigarettes  . Smokeless tobacco: Former Neurosurgeon     Comment: Started smokin at age 68.   Marland Kitchen Alcohol use Yes     Comment: occasional.      Allergies   Patient has no known allergies.   Review of Systems Review of Systems  Musculoskeletal: Positive for arthralgias.  All other systems reviewed and are negative.    Physical Exam Updated Vital Signs BP 126/80 (BP Location: Right Arm)   Pulse 80   Temp 97.8 F (36.6 C)   Resp 20   Ht 6' (1.829 m)   Wt 113.4 kg   SpO2 98%   BMI 33.91 kg/m   Physical Exam  Constitutional: He is oriented to person,  place, and time. He appears well-developed and well-nourished.  HENT:  Head: Normocephalic and atraumatic.  Mouth/Throat: Oropharynx is clear and moist.  Eyes: Conjunctivae and EOM are normal. Pupils are equal, round, and reactive to light.  Neck: Normal range of motion.  Cardiovascular: Normal rate, regular rhythm and normal heart sounds.   Pulmonary/Chest: Effort normal and breath sounds normal. No respiratory distress. He has no wheezes.  Abdominal: Soft. Bowel sounds are normal.  Musculoskeletal: Normal range of motion.  Right leg with erythema extending from medial knee extending up to medial thigh into groin; locally TTP with prominent vasculature; area not significantly warm to touch; DP pulse intact; moving extremity well, normal sensation distally  Neurological: He is alert and oriented to person, place, and time.  Skin: Skin is warm and dry.  Psychiatric: He has a normal mood and affect.  Nursing note and vitals reviewed.    ED Treatments / Results  Labs (all labs ordered are listed, but only abnormal results are displayed) Labs Reviewed  I-STAT CHEM 8, ED    EKG  EKG Interpretation None       Radiology No results found.   *Preliminary Results* Bilateral lower extremity venous duplex completed. The right lower extremity is positive for acute deep vein thrombosis involving the right saphenofemoral junction, common femoral, femoral, and popliteal veins. There is evidence of acute superficial vein thrombosis involving the right greater saphenous vein. There is evidence of a small segment of chronic deep vein thrombosis involving the left mid femoral vein. There is no evidence of acute deep vein thrombosis involving the left lower extremity. No evidence of Baker's cyst bilaterally.  Preliminary results discussed with Dr. Dalene Seltzer.  07/08/2016 8:52 AM Gertie Fey, BS, RVT, RDCS, RDMS      Electronically signed by Gwendolyn Fill at 07/08/2016 8:58 AM        Procedures Procedures (including critical care time)   Medications Ordered in ED Medications - No data to display   Initial Impression / Assessment and Plan / ED Course  I have reviewed the triage vital signs and the nursing notes.  Pertinent labs & imaging results that were available during my care of the patient were reviewed by me and considered in my medical decision making (see chart for details).  36 year old male here with right leg pain. Has history of factor V Leiden deficiency with recurrent DVT and PE. Has IVC filter in place. Has been noncompliant with his Xarelto for the past few months. Denies any chest pain or shortness of breath. He does have some erythema tracking along the vein of the right leg with prominent vasculature. Will obtain venous duplex to assess for DVT. Chem-8 obtained, renal function and hemoglobin stable.  His duplex is  positive for acute DVT in the right leg. This appears fairly extensive. He has a chronic clot in the left leg as well. I discussed this with vascular surgery, Dr. Randie Heinzain-- he has ordered additional studies. He will be down to evaluate patient after clinic, vascular PA to evaluate in the interim.  2:08 PM Patient has had IVC/Iliac duplex.  Appears to have clot into the IVC and surrounding filter.  Dr. Randie Heinzain has evaluated patient at the bedside.  Requested to start heparin drip.  He will admit, plan for procedure tomorrow.  2:14 PM Patient now refusing admission as he wants to go home to his kids tonight.  Discussed with Dr. Randie Heinzain-- we will re-start patient's xarelto, dose given here.  Also recommends augmentin for likely superficial thrombophlebitis.  He will have procedure done on Monday with Dr. Randie Heinzain.    Discussed plan with patient, he acknowledged understanding and agreed with plan of care.  Return precautions given for new or worsening symptoms.  Final Clinical Impressions(s) / ED Diagnoses   Final diagnoses:  Acute deep vein  thrombosis (DVT) of femoral vein of right lower extremity (HCC)    New Prescriptions New Prescriptions   AMOXICILLIN-CLAVULANATE (AUGMENTIN) 875-125 MG TABLET    Take 1 tablet by mouth every 12 (twelve) hours.   RIVAROXABAN 15 & 20 MG TBPK    Take as directed on package: Start with one 15mg  tablet by mouth twice a day with food. On Day 22, switch to one 20mg  tablet once a day with food.     Garlon HatchetLisa M Adhya Cocco, PA-C 07/08/16 1504    Alvira MondayErin Schlossman, MD 07/11/16 [phone]

## 2016-07-08 NOTE — ED Notes (Signed)
Vascular MD at bedside.

## 2016-07-08 NOTE — Consult Note (Signed)
[facility] NOTE   MRN : [id number]  Reason for Consult: DVT left LE Referring Physician: ED  History of Present Illness: 36 y/o male with a PMH of factor V Leiden, PE, and multiple DVTs who is s/p IVC filter placement 9-10 years ago presents today complaining of a recurrent right lower extremity clot. He states that the clot started forming about a week ago after he fell asleep in a chair. Since the time, the patient reports that the medial aspect of his thigh just above the knee has become more erythematous and painful to the touch. Last anticoagulation was with Xarelto about 6 months ago.  He has also taken Coumadin in the past.  He does have a history of venous stasis ulcers.  No other pertinent medical history.       No current facility-administered medications for this encounter.    Current Outpatient Prescriptions  Medication Sig Dispense Refill  . acetaminophen (TYLENOL) 500 MG tablet Take 1 tablet (500 mg total) by mouth every 6 (six) hours as needed. (Patient not taking: Reported on 12/03/2015) 30 tablet 0  . ciprofloxacin (CIPRO) 500 MG tablet Take 1 tablet (500 mg total) by mouth 2 (two) times daily. (Patient not taking: Reported on 07/08/2016) 14 tablet 0  . HYDROcodone-acetaminophen (NORCO/VICODIN) 5-325 MG tablet Take 2 tablets by mouth every 4 (four) hours as needed. (Patient not taking: Reported on 07/08/2016) 10 tablet 0  . metroNIDAZOLE (FLAGYL) 500 MG tablet Take 1 tablet (500 mg total) by mouth 2 (two) times daily. (Patient not taking: Reported on 07/08/2016) 14 tablet 0  . Rivaroxaban 15 & 20 MG TBPK Take 15-20 mg by mouth daily. Take as directed on package: Start with one 15mg  tablet by mouth twice a day with food. On Day 22, switch to one 20mg  tablet once a day with food. (Patient not taking: Reported on 07/08/2016) 51 each 0    Pt meds include: Statin :No Betablocker: No ASA: No Other anticoagulants/antiplatelets: Bone  currently  Past Medical History:  Diagnosis Date  . Anxiety and depression   . DVT (deep venous thrombosis) (HCC)    Pt unsure of date   . Factor V Leiden mutation (HCC)    With resultant hypercoaguable state, Hx of DVT, PE, SP IVC filter placement, on chronic coumadin and requires high doses to maintine IRN 2-3.  . PE (pulmonary thromboembolism) (HCC)    unsure of date  . Pneumothorax 8/11   HX of, requiring chest tube/hospitalization.   . Venous stasis    bilateral LE.    Past Surgical History:  Procedure Laterality Date  . placement of IVC filter  8/09  . Placement of large-bore right chest tube  8/11  . right minithoracotomy with evacuation of hematoma  8/09    Social History Social History  Substance Use Topics  . Smoking status: Current Every Day Smoker    Packs/day: 1.50    Types: Cigarettes  . Smokeless tobacco: Former Neurosurgeon     Comment: Started smokin at age 65.   Marland Kitchen Alcohol use Yes     Comment: occasional.     Family History No family history on file.  No Known Allergies   REVIEW OF SYSTEMS  General: [ ]  Weight loss, [ ]  Fever, [ ]  chills Neurologic: [ ]  Dizziness, [ ]  Blackouts, [ ]  Seizure [ ]  Stroke, [ ]  "Mini stroke", [ ]  Slurred speech, [ ]  Temporary blindness; [ ]  weakness in arms or legs, [ ]   Hoarseness [ ]  Dysphagia Cardiac: [ ]  Chest pain/pressure, [ ]  Shortness of breath at rest [ ]  Shortness of breath with exertion, [ ]  Atrial fibrillation or irregular heartbeat  Vascular: [ ]  Pain in legs with walking, [ ]  Pain in legs at rest, [ ]  Pain in legs at night,  [ ]  history of Non-healing ulcer, [ ]  Blood clot in vein/DVT,   Pulmonary: [ ]  Home oxygen, [ ]  Productive cough, [ ]  Coughing up blood, [ ]  Asthma,  [ ]  Wheezing [ ]  COPD Musculoskeletal:  [ ]  Arthritis, [ ]  Low back pain, [ ]  Joint pain Hematologic: [ ]  Easy Bruising, [ ]  Anemia; [ ]  Hepatitis Gastrointestinal: [ ]  Blood in stool, [ ]  Gastroesophageal Reflux/heartburn, Urinary: [ ]  chronic  Kidney disease, [ ]  on HD - [ ]  MWF or [ ]  TTHS, [ ]  Burning with urination, [ ]  Difficulty urinating Skin: [ ]  Rashes, [ ]  Wounds Psychological: [ ]  Anxiety, [ ]  Depression  Physical Examination Vitals:   07/08/16 1015 07/08/16 1030 07/08/16 1045 07/08/16 1100  BP: 114/69 111/82 108/75 112/81  Pulse: (!) 59 64 64 (!) 58  Resp: 13 12 12 13   Temp:      SpO2: 96% 96% 95% 97%  Weight:      Height:       Body mass index is 33.91 kg/m.  General:  WDWN in NAD Gait: Normal HENT: WNL Eyes: Pupils equal Pulmonary: normal non-labored breathing , without Rales, rhonchi,  wheezing Cardiac: RRR, without  Murmurs, rubs or gallops; No carotid bruits Abdomen: soft, NT, no masses Skin: no rashes, ulcers noted;  no Gangrene , Positive right medial thigh  cellulitis; no open wounds; Positive brawny discoloration bilateral LE gator region.  Vascular Exam/Pulses:Palpable radial,femoral,DP/PT 2+ B   Musculoskeletal: no muscle wasting or atrophy; no edema  Neurologic: A&O X 3; Appropriate Affect ;  SENSATION: normal; MOTOR FUNCTION: 5/5 Symmetric Speech is fluent/normal   Significant Diagnostic Studies: CBC Lab Results  Component Value Date   WBC 11.9 (H) 12/03/2015   HGB 15.3 07/08/2016   HCT 45.0 07/08/2016   MCV 95.0 12/03/2015   PLT 228 12/03/2015    BMET    Component Value Date/Time   NA 141 07/08/2016 0702   K 3.8 07/08/2016 0702   CL 107 07/08/2016 0702   CO2 28 12/03/2015 0210   GLUCOSE 93 07/08/2016 0702   BUN 18 07/08/2016 0702   CREATININE 0.90 07/08/2016 0702   CALCIUM 9.6 12/03/2015 0210   GFRNONAA >60 12/03/2015 0210   GFRAA >60 12/03/2015 0210   Estimated Creatinine Clearance: 144.7 mL/min (by C-G formula based on SCr of 0.9 mg/dL).  COAG Lab Results  Component Value Date   INR 1.07 06/16/2015   INR 0.87 09/20/2013   INR 1.0 11/05/2012     Non-Invasive Vascular Imaging:  *Preliminary Results* Bilateral lower extremity venous duplex completed. The  right lower extremity is positive for acute deep vein thrombosis involving the right saphenofemoral junction, common femoral, femoral, and popliteal veins. There is evidence of acute superficial vein thrombosis involving the right greater saphenous vein. There is evidence of a small segment of chronic deep vein thrombosis involving the left mid femoral vein. There is no evidence of acute deep vein thrombosis involving the left lower extremity. No evidence of Baker's cyst bilaterally.  Pending IVC iliac/IVC ultrasound  ASSESSMENT/PLAN:  factor V Leiden with multiple events to include PE, and DVT's.  He has been on Coumadin in the past and  most recently Xarelto 6 months ago.  He stopped it because he was having a Birthday and wanted to drink alcohol.  He just never restarted it.  [facility]. Right thigh cellulitis NPO for pending study  Clinton GallantCOLLINS, EMMA [facility] 07/08/2016 11:16 AM   I have independently interviewed patient and agree with PA assessment and plan above.   Preliminary results by tech - IVC/iliac veins Duplex Completed. The IVC is patent with a small amount of chronic appearing thrombus in the distal IVC. The IVC filter was not clearly visualized. A small amount of non-obstructing thrombus is visualized in the right common and external iliac veins. The left iliac vein appears patent with questionable chronic thrombus. Results given to Dr. Randie Heinzain.  Rita Sturdivant, BS, RDMS, RVT  Offered patient procedure today for venogram of right leg with possible left and likely lysis procedure. He is adamant that he go home now and I have cautioned him about the risk of progression or pe. He understands but has social issues to deal with. Needs to be started on xarelto prior to leaving and have it prescribed as outpatient. Also rec po abx for thrombophlebitis. Have talked to office to schedule for Monday. Can continue xarelto up until case.    Amy Gothard C. Randie Heinzain, MD [facility] [place] Office: [phone] Pager: [phone]

## 2016-07-08 NOTE — Progress Notes (Signed)
Preliminary results by tech - IVC/iliac veins Duplex Completed. The IVC is patent with a small amount of chronic appearing thrombus in the distal IVC. The IVC filter was not clearly visualized. A small amount of non-obstructing thrombus is visualized in the right common and external iliac veins. The left iliac vein appears patent with questionable chronic thrombus. Results given to Dr. Cain.  MarilynneRandie Heinz Halstedita Haya Hemler, BS, RDMS, RVT

## 2016-07-08 NOTE — ED Notes (Signed)
Pt states he  Ate "a couple of bites of cheese. And then had it taken away. Pt aware npo.

## 2016-07-10 MED ORDER — DEXTROSE 5 % IV SOLN
1.5000 g | INTRAVENOUS | Status: AC
  Administered 2016-07-11: 1.5 g via INTRAVENOUS
  Filled 2016-07-10: qty 1.5

## 2016-07-11 ENCOUNTER — Inpatient Hospital Stay (HOSPITAL_COMMUNITY): Payer: BLUE CROSS/BLUE SHIELD | Admitting: Anesthesiology

## 2016-07-11 ENCOUNTER — Inpatient Hospital Stay (HOSPITAL_COMMUNITY)
Admission: RE | Admit: 2016-07-11 | Discharge: 2016-07-13 | DRG: 252 | Disposition: A | Discharge status: Home / Self Care | Payer: BLUE CROSS/BLUE SHIELD | Source: Ambulatory Visit | Attending: Vascular Surgery | Admitting: Vascular Surgery

## 2016-07-11 ENCOUNTER — Encounter (HOSPITAL_COMMUNITY)
Admission: RE | Disposition: A | Discharge status: Home / Self Care | Payer: Self-pay | Source: Ambulatory Visit | Attending: Vascular Surgery

## 2016-07-11 ENCOUNTER — Encounter (HOSPITAL_COMMUNITY): Payer: Self-pay | Admitting: *Deleted

## 2016-07-11 DIAGNOSIS — I82220 Acute embolism and thrombosis of inferior vena cava: Secondary | ICD-10-CM | POA: Diagnosis present

## 2016-07-11 DIAGNOSIS — D6851 Activated protein C resistance: Secondary | ICD-10-CM | POA: Diagnosis present

## 2016-07-11 DIAGNOSIS — T82868A Thrombosis of vascular prosthetic devices, implants and grafts, initial encounter: Principal | ICD-10-CM | POA: Diagnosis present

## 2016-07-11 DIAGNOSIS — I252 Old myocardial infarction: Secondary | ICD-10-CM | POA: Diagnosis not present

## 2016-07-11 DIAGNOSIS — I82409 Acute embolism and thrombosis of unspecified deep veins of unspecified lower extremity: Secondary | ICD-10-CM | POA: Diagnosis present

## 2016-07-11 DIAGNOSIS — Y831 Surgical operation with implant of artificial internal device as the cause of abnormal reaction of the patient, or of later complication, without mention of misadventure at the time of the procedure: Secondary | ICD-10-CM | POA: Diagnosis present

## 2016-07-11 DIAGNOSIS — I878 Other specified disorders of veins: Secondary | ICD-10-CM | POA: Diagnosis present

## 2016-07-11 DIAGNOSIS — Z86711 Personal history of pulmonary embolism: Secondary | ICD-10-CM | POA: Diagnosis not present

## 2016-07-11 DIAGNOSIS — I82511 Chronic embolism and thrombosis of right femoral vein: Secondary | ICD-10-CM | POA: Diagnosis present

## 2016-07-11 DIAGNOSIS — I82429 Acute embolism and thrombosis of unspecified iliac vein: Secondary | ICD-10-CM

## 2016-07-11 DIAGNOSIS — D6859 Other primary thrombophilia: Secondary | ICD-10-CM | POA: Diagnosis not present

## 2016-07-11 DIAGNOSIS — I82521 Chronic embolism and thrombosis of right iliac vein: Secondary | ICD-10-CM | POA: Diagnosis present

## 2016-07-11 DIAGNOSIS — Z86718 Personal history of other venous thrombosis and embolism: Secondary | ICD-10-CM | POA: Diagnosis not present

## 2016-07-11 DIAGNOSIS — M79604 Pain in right leg: Secondary | ICD-10-CM | POA: Diagnosis present

## 2016-07-11 HISTORY — DX: Pneumonia, unspecified organism: J18.9

## 2016-07-11 HISTORY — PX: ULTRASOUND GUIDANCE FOR VASCULAR ACCESS: SHX6516

## 2016-07-11 HISTORY — DX: Acute myocardial infarction, unspecified: I21.9

## 2016-07-11 HISTORY — DX: Gastro-esophageal reflux disease without esophagitis: K21.9

## 2016-07-11 HISTORY — PX: INTRAVASCULAR ULTRASOUND/IVUS: CATH118244

## 2016-07-11 HISTORY — PX: ANGIOPLASTY ILLIAC ARTERY: SHX5720

## 2016-07-11 LAB — POCT I-STAT 4, (NA,K, GLUC, HGB,HCT)
GLUCOSE: 100 mg/dL — AB (ref 65–99)
HCT: 46 % (ref 39.0–52.0)
HEMOGLOBIN: 15.6 g/dL (ref 13.0–17.0)
POTASSIUM: 4 mmol/L (ref 3.5–5.1)
Sodium: 139 mmol/L (ref 135–145)

## 2016-07-11 LAB — HEPARIN LEVEL (UNFRACTIONATED): Heparin Unfractionated: 1.06 IU/mL — ABNORMAL HIGH (ref 0.30–0.70)

## 2016-07-11 LAB — CBC
HCT: 45.5 % (ref 39.0–52.0)
HEMOGLOBIN: 15.9 g/dL (ref 13.0–17.0)
MCH: 32.4 pg (ref 26.0–34.0)
MCHC: 34.9 g/dL (ref 30.0–36.0)
MCV: 92.9 fL (ref 78.0–100.0)
Platelets: 200 10*3/uL (ref 150–400)
RBC: 4.9 MIL/uL (ref 4.22–5.81)
RDW: 13.2 % (ref 11.5–15.5)
WBC: 11.6 10*3/uL — AB (ref 4.0–10.5)

## 2016-07-11 LAB — PROTIME-INR
INR: 1.43
PROTHROMBIN TIME: 17.5 s — AB (ref 11.4–15.2)

## 2016-07-11 LAB — FIBRINOGEN: Fibrinogen: 405 mg/dL (ref 210–475)

## 2016-07-11 SURGERY — IVUS (INTRAVASCULAR ULTRASOUND)
Anesthesia: General | Site: Leg Upper | Laterality: Right

## 2016-07-11 MED ORDER — CEFAZOLIN SODIUM-DEXTROSE 2-4 GM/100ML-% IV SOLN
2.0000 g | Freq: Three times a day (TID) | INTRAVENOUS | Status: AC
  Administered 2016-07-11: 2 g via INTRAVENOUS
  Filled 2016-07-11 (×2): qty 100

## 2016-07-11 MED ORDER — SODIUM CHLORIDE 0.9 % IV SOLN
INTRAVENOUS | Status: DC | PRN
  Administered 2016-07-11: 17:00:00

## 2016-07-11 MED ORDER — HYDROMORPHONE HCL 1 MG/ML IJ SOLN
0.2500 mg | INTRAMUSCULAR | Status: DC | PRN
  Administered 2016-07-11 (×2): 0.5 mg via INTRAVENOUS

## 2016-07-11 MED ORDER — ROCURONIUM BROMIDE 100 MG/10ML IV SOLN: INTRAVENOUS | Status: DC | PRN

## 2016-07-11 MED ORDER — PROPOFOL 10 MG/ML IV BOLUS
INTRAVENOUS | Status: DC | PRN
  Administered 2016-07-11: 200 mg via INTRAVENOUS

## 2016-07-11 MED ORDER — FENTANYL CITRATE (PF) 100 MCG/2ML IJ SOLN
INTRAMUSCULAR | Status: DC | PRN
  Administered 2016-07-11: 100 ug via INTRAVENOUS

## 2016-07-11 MED ORDER — 0.9 % SODIUM CHLORIDE (POUR BTL) OPTIME
TOPICAL | Status: DC | PRN
  Administered 2016-07-11: 1000 mL

## 2016-07-11 MED ORDER — FENTANYL CITRATE (PF) 250 MCG/5ML IJ SOLN
INTRAMUSCULAR | Status: AC
  Filled 2016-07-11: qty 5

## 2016-07-11 MED ORDER — MIDAZOLAM HCL 2 MG/2ML IJ SOLN
INTRAMUSCULAR | Status: AC
  Filled 2016-07-11: qty 2

## 2016-07-11 MED ORDER — LIDOCAINE HCL (CARDIAC) 20 MG/ML IV SOLN: INTRAVENOUS | Status: DC | PRN

## 2016-07-11 MED ORDER — CHLORHEXIDINE GLUCONATE CLOTH 2 % EX PADS: 6.0000 | MEDICATED_PAD | Freq: Once | CUTANEOUS | Status: DC

## 2016-07-11 MED ORDER — PHENYLEPHRINE 40 MCG/ML (10ML) SYRINGE FOR IV PUSH (FOR BLOOD PRESSURE SUPPORT)
PREFILLED_SYRINGE | INTRAVENOUS | Status: AC
  Filled 2016-07-11: qty 10

## 2016-07-11 MED ORDER — PHENYLEPHRINE HCL 10 MG/ML IJ SOLN
INTRAMUSCULAR | Status: DC | PRN
  Administered 2016-07-11: 120 ug via INTRAVENOUS

## 2016-07-11 MED ORDER — LACTATED RINGERS IV SOLN
INTRAVENOUS | Status: DC | PRN
  Administered 2016-07-11: 16:00:00 via INTRAVENOUS

## 2016-07-11 MED ORDER — SODIUM CHLORIDE 0.9 % IV SOLN
1.0000 mg/h | INTRAVENOUS | Status: DC
  Administered 2016-07-12 (×2): 1 mg/h
  Filled 2016-07-11 (×6): qty 10

## 2016-07-11 MED ORDER — ONDANSETRON HCL 4 MG/2ML IJ SOLN
INTRAMUSCULAR | Status: DC | PRN
  Administered 2016-07-11: 4 mg via INTRAVENOUS

## 2016-07-11 MED ORDER — HEPARIN SODIUM (PORCINE) 1000 UNIT/ML IJ SOLN
INTRAMUSCULAR | Status: AC
  Filled 2016-07-11: qty 1

## 2016-07-11 MED ORDER — SODIUM CHLORIDE 0.9 % IV SOLN
0.2500 mg/h | INTRAVENOUS | Status: DC
  Filled 2016-07-11: qty 10

## 2016-07-11 MED ORDER — SUGAMMADEX SODIUM 200 MG/2ML IV SOLN
INTRAVENOUS | Status: DC | PRN
  Administered 2016-07-11: 226.8 mg via INTRAVENOUS

## 2016-07-11 MED ORDER — PROPOFOL 10 MG/ML IV BOLUS: INTRAVENOUS | Status: DC | PRN

## 2016-07-11 MED ORDER — HEPARIN (PORCINE) IN NACL 100-0.45 UNIT/ML-% IJ SOLN
500.0000 [IU]/h | INTRAMUSCULAR | Status: DC
  Administered 2016-07-11: 500 [IU]/h via INTRAVENOUS
  Filled 2016-07-11: qty 250

## 2016-07-11 MED ORDER — LABETALOL HCL 5 MG/ML IV SOLN
10.0000 mg | INTRAVENOUS | Status: DC | PRN
Start: 2016-07-11 — End: 2016-07-12

## 2016-07-11 MED ORDER — HYDROMORPHONE HCL 1 MG/ML IJ SOLN
INTRAMUSCULAR | Status: AC
  Administered 2016-07-11: 0.5 mg via INTRAVENOUS
  Filled 2016-07-11: qty 0.5

## 2016-07-11 MED ORDER — HEPARIN SODIUM (PORCINE) 1000 UNIT/ML IJ SOLN
INTRAMUSCULAR | Status: DC | PRN
  Administered 2016-07-11: 8000 [IU] via INTRAVENOUS

## 2016-07-11 MED ORDER — MORPHINE SULFATE (PF) 4 MG/ML IV SOLN
5.0000 mg | INTRAVENOUS | Status: DC | PRN
  Administered 2016-07-12 (×4): 5 mg via INTRAVENOUS
  Filled 2016-07-11 (×4): qty 2

## 2016-07-11 MED ORDER — LIDOCAINE 2% (20 MG/ML) 5 ML SYRINGE
INTRAMUSCULAR | Status: AC
  Filled 2016-07-11: qty 5

## 2016-07-11 MED ORDER — LIDOCAINE HCL (CARDIAC) 20 MG/ML IV SOLN
INTRAVENOUS | Status: DC | PRN
  Administered 2016-07-11: 50 mg via INTRAVENOUS

## 2016-07-11 MED ORDER — PROPOFOL 10 MG/ML IV BOLUS
INTRAVENOUS | Status: AC
  Filled 2016-07-11: qty 20

## 2016-07-11 MED ORDER — FENTANYL CITRATE (PF) 100 MCG/2ML IJ SOLN
50.0000 ug | Freq: Once | INTRAMUSCULAR | Status: AC
  Administered 2016-07-11: 50 ug via INTRAVENOUS

## 2016-07-11 MED ORDER — SUGAMMADEX SODIUM 500 MG/5ML IV SOLN
INTRAVENOUS | Status: AC
  Filled 2016-07-11: qty 5

## 2016-07-11 MED ORDER — LACTATED RINGERS IV SOLN
INTRAVENOUS | Status: DC
  Administered 2016-07-11: 11:00:00 via INTRAVENOUS

## 2016-07-11 MED ORDER — MIDAZOLAM HCL 2 MG/2ML IJ SOLN: INTRAMUSCULAR | Status: DC | PRN

## 2016-07-11 MED ORDER — HYDRALAZINE HCL 20 MG/ML IJ SOLN: 5.0000 mg | INTRAMUSCULAR | Status: DC | PRN

## 2016-07-11 MED ORDER — ONDANSETRON HCL 4 MG/2ML IJ SOLN
INTRAMUSCULAR | Status: AC
  Filled 2016-07-11: qty 2

## 2016-07-11 MED ORDER — MIDAZOLAM HCL 2 MG/2ML IJ SOLN
INTRAMUSCULAR | Status: DC | PRN
  Administered 2016-07-11: 2 mg via INTRAVENOUS

## 2016-07-11 MED ORDER — ROCURONIUM BROMIDE 100 MG/10ML IV SOLN
INTRAVENOUS | Status: DC | PRN
  Administered 2016-07-11: 50 mg via INTRAVENOUS

## 2016-07-11 MED ORDER — SODIUM CHLORIDE 0.9 % IV SOLN: INTRAVENOUS | Status: DC

## 2016-07-11 MED ORDER — ONDANSETRON HCL 4 MG/2ML IJ SOLN: 4.0000 mg | Freq: Four times a day (QID) | INTRAMUSCULAR | Status: DC | PRN

## 2016-07-11 MED ORDER — ROCURONIUM BROMIDE 50 MG/5ML IV SOSY
PREFILLED_SYRINGE | INTRAVENOUS | Status: AC
  Filled 2016-07-11: qty 5

## 2016-07-11 MED ORDER — IOPAMIDOL (ISOVUE-300) INJECTION 61%
INTRAVENOUS | Status: DC | PRN
Start: 2016-07-11 — End: 2016-07-11

## 2016-07-11 MED ORDER — FENTANYL CITRATE (PF) 100 MCG/2ML IJ SOLN
INTRAMUSCULAR | Status: AC
  Filled 2016-07-11: qty 2

## 2016-07-11 MED ORDER — HYDROMORPHONE HCL 1 MG/ML IJ SOLN
INTRAMUSCULAR | Status: AC
  Filled 2016-07-11: qty 0.5

## 2016-07-11 MED ORDER — SODIUM CHLORIDE 0.9 % IV SOLN
INTRAVENOUS | Status: DC
  Administered 2016-07-12 (×2): via INTRAVENOUS

## 2016-07-11 MED ORDER — FENTANYL CITRATE (PF) 100 MCG/2ML IJ SOLN: INTRAMUSCULAR | Status: DC | PRN

## 2016-07-11 MED ORDER — IODIXANOL 320 MG/ML IV SOLN
INTRAVENOUS | Status: DC | PRN
  Administered 2016-07-11: 100 mL via INTRAVENOUS

## 2016-07-11 MED ORDER — HEPARIN (PORCINE) IN NACL 100-0.45 UNIT/ML-% IJ SOLN: 500.0000 [IU]/h | INTRAMUSCULAR | Status: DC

## 2016-07-11 MED ORDER — METOPROLOL TARTRATE 5 MG/5ML IV SOLN: 2.0000 mg | INTRAVENOUS | Status: DC | PRN

## 2016-07-11 SURGICAL SUPPLY — 89 items
BAG DECANTER FOR FLEXI CONT (MISCELLANEOUS) ×4 IMPLANT
BAG SNAP BAND KOVER 36X36 (MISCELLANEOUS) ×4 IMPLANT
BALLN MUSTANG 12.0X40 75 (BALLOONS) ×3
BALLN MUSTANG 12.0X40 75CM (BALLOONS) ×1
BALLN MUSTANG 8X80X75 (BALLOONS) ×4
BALLOON MUSTANG 12.0X40 75 (BALLOONS) ×2 IMPLANT
BALLOON MUSTANG 8X80X75 (BALLOONS) ×2 IMPLANT
BANDAGE ACE 4X5 VEL STRL LF (GAUZE/BANDAGES/DRESSINGS) IMPLANT
BLADE 11 SAFETY STRL DISP (BLADE) ×4 IMPLANT
BLADE SURG 11 STRL SS (BLADE) ×4 IMPLANT
CANISTER SUCT 3000ML PPV (MISCELLANEOUS) ×4 IMPLANT
CATH ANGIO 5F BER2 100CM (CATHETERS) ×4 IMPLANT
CATH ANGIO 5F BER2 65CM (CATHETERS) ×8 IMPLANT
CATH OMNI FLUSH .035X70CM (CATHETERS) IMPLANT
CATH PULSESPRAY 5F 20CMX135CM (CATHETERS) ×4 IMPLANT
CATH VISIONS PV .035 IVUS (CATHETERS) ×4 IMPLANT
CLIP TI MEDIUM 6 (CLIP) IMPLANT
CLIP TI WIDE RED SMALL 6 (CLIP) IMPLANT
COVER DOME SNAP 22 D (MISCELLANEOUS) ×4 IMPLANT
COVER PROBE W GEL 5X96 (DRAPES) ×8 IMPLANT
COVER SURGICAL LIGHT HANDLE (MISCELLANEOUS) ×8 IMPLANT
DERMABOND ADVANCED (GAUZE/BANDAGES/DRESSINGS) ×2
DERMABOND ADVANCED .7 DNX12 (GAUZE/BANDAGES/DRESSINGS) ×2 IMPLANT
DEVICE TORQUE KENDALL .025-038 (MISCELLANEOUS) ×4 IMPLANT
DRAIN CHANNEL 15F RND FF W/TCR (WOUND CARE) IMPLANT
DRAPE FEMORAL ANGIO 80X135IN (DRAPES) ×4 IMPLANT
DRAPE HALF SHEET 40X57 (DRAPES) ×4 IMPLANT
DRAPE X-RAY CASS 24X20 (DRAPES) IMPLANT
DRSG TEGADERM 4X4.75 (GAUZE/BANDAGES/DRESSINGS) ×4 IMPLANT
ELECT REM PT RETURN 9FT ADLT (ELECTROSURGICAL)
ELECTRODE REM PT RTRN 9FT ADLT (ELECTROSURGICAL) IMPLANT
EVACUATOR SILICONE 100CC (DRAIN) IMPLANT
GAUZE SPONGE 4X4 12PLY STRL (GAUZE/BANDAGES/DRESSINGS) ×4 IMPLANT
GAUZE SPONGE 4X4 16PLY XRAY LF (GAUZE/BANDAGES/DRESSINGS) ×4 IMPLANT
GLOVE BIO SURGEON STRL SZ7.5 (GLOVE) ×4 IMPLANT
GLOVE BIOGEL PI IND STRL 6.5 (GLOVE) ×2 IMPLANT
GLOVE BIOGEL PI IND STRL 7.0 (GLOVE) ×2 IMPLANT
GLOVE BIOGEL PI INDICATOR 6.5 (GLOVE) ×2
GLOVE BIOGEL PI INDICATOR 7.0 (GLOVE) ×2
GLOVE SURG SS PI 6.0 STRL IVOR (GLOVE) ×4 IMPLANT
GLOVE SURG SS PI 7.0 STRL IVOR (GLOVE) ×4 IMPLANT
GOWN STRL NON-REIN LRG LVL3 (GOWN DISPOSABLE) ×4 IMPLANT
GOWN STRL REUS W/ TWL LRG LVL3 (GOWN DISPOSABLE) ×4 IMPLANT
GOWN STRL REUS W/ TWL XL LVL3 (GOWN DISPOSABLE) ×4 IMPLANT
GOWN STRL REUS W/TWL LRG LVL3 (GOWN DISPOSABLE) ×4
GOWN STRL REUS W/TWL XL LVL3 (GOWN DISPOSABLE) ×4
GUIDEWIRE ANGLED .035X260CM (WIRE) ×4 IMPLANT
HEMOSTAT SNOW SURGICEL 2X4 (HEMOSTASIS) IMPLANT
KIT BASIN OR (CUSTOM PROCEDURE TRAY) ×4 IMPLANT
KIT ENCORE 26 ADVANTAGE (KITS) ×4 IMPLANT
KIT ROOM TURNOVER OR (KITS) ×4 IMPLANT
MARKER GRAFT CORONARY BYPASS (MISCELLANEOUS) IMPLANT
NEEDLE PERC 18GX7CM (NEEDLE) IMPLANT
NS IRRIG 1000ML POUR BTL (IV SOLUTION) IMPLANT
PACK CV ACCESS (CUSTOM PROCEDURE TRAY) IMPLANT
PACK GENERAL/GYN (CUSTOM PROCEDURE TRAY) ×4 IMPLANT
PACK PERIPHERAL VASCULAR (CUSTOM PROCEDURE TRAY) IMPLANT
PAD ARMBOARD 7.5X6 YLW CONV (MISCELLANEOUS) ×8 IMPLANT
PADDING CAST ABS 6INX4YD NS (CAST SUPPLIES)
PADDING CAST ABS COTTON 6X4 NS (CAST SUPPLIES) IMPLANT
SET COLLECT BLD 21X3/4 12 (NEEDLE) IMPLANT
SET MICROPUNCTURE 5F STIFF (MISCELLANEOUS) IMPLANT
SHEATH AVANTI 11CM 5FR (MISCELLANEOUS) ×4 IMPLANT
SHEATH AVANTI 11CM 8FR (MISCELLANEOUS) ×4 IMPLANT
SHEATH PINNACLE 9F 10CM (SHEATH) ×4 IMPLANT
STOPCOCK 4 WAY LG BORE MALE ST (IV SETS) ×4 IMPLANT
STOPCOCK MORSE 400PSI 3WAY (MISCELLANEOUS) ×4 IMPLANT
SUT ETHILON 3 0 PS 1 (SUTURE) IMPLANT
SUT MNCRL AB 4-0 PS2 18 (SUTURE) IMPLANT
SUT PROLENE 5 0 C 1 24 (SUTURE) IMPLANT
SUT PROLENE 6 0 BV (SUTURE) IMPLANT
SUT PROLENE 7 0 BV 1 (SUTURE) IMPLANT
SUT SILK 2 0 SH (SUTURE) ×4 IMPLANT
SUT VIC AB 2-0 CT1 27 (SUTURE)
SUT VIC AB 2-0 CT1 TAPERPNT 27 (SUTURE) IMPLANT
SUT VIC AB 3-0 SH 27 (SUTURE)
SUT VIC AB 3-0 SH 27X BRD (SUTURE) IMPLANT
SYR 10ML LL (SYRINGE) ×12 IMPLANT
SYR 20CC LL (SYRINGE) ×8 IMPLANT
SYR 30ML LL (SYRINGE) ×12 IMPLANT
SYR MEDRAD MARK V 150ML (SYRINGE) IMPLANT
TRAY FOLEY W/METER SILVER 16FR (SET/KITS/TRAYS/PACK) IMPLANT
TUBING EXTENTION W/L.L. (IV SETS) IMPLANT
TUBING HIGH PRESSURE 120CM (CONNECTOR) ×4 IMPLANT
UNDERPAD 30X30 (UNDERPADS AND DIAPERS) IMPLANT
WATER STERILE IRR 1000ML POUR (IV SOLUTION) IMPLANT
WIRE AMPLATZ SS-J .035X260CM (WIRE) ×4 IMPLANT
WIRE BENTSON .035X145CM (WIRE) ×8 IMPLANT
WIRE MINI STICK MAX (SHEATH) ×4 IMPLANT

## 2016-07-11 NOTE — Op Note (Signed)
    Patient name: [name] [name] MRN: [id number] DOB: [birth year] Sex: male  07/11/2016 Pre-operative Diagnosis: dvt Post-operative diagnosis:  Same Surgeon:  Apolinar JunesBrandon C. Randie Heinzain, MD Procedure Performed: 1.  US guided cannulation of right lesser saphenous vein 2.  IVUS of right popliteal, femoral, common femoral, external iliac, common iliac veins and ivc 3.  Balloon angioplasty of left external iliac vein and common femoral vein with 12mm balloon 4.  Placement of 20cm treatment length into ivc and iliac veins  Indications:  36 year old male with history of DVT and pulmonary embolism had IVC filter placed. He now has a several-day history of right lower extremity swelling and pain has ultrasound demonstrating significant DVT in his right lower extremity as well as his iliac veins that might be chronic or centrally. He is therefore indicated for the above procedure.  Findings: The lesser saphenous vein was cannulated on the patient's right and we are able to get access into the superior vena cava and right sided innominate vein. I was demonstrated acute thrombus at the level of the existing IVC filter and somewhat above the filter filling at least 70% of it. There was also residual thrombus in the common iliac vein on the right and evidence of chronic thrombus down into the femoral vein. There was a tight stenosis approximately 2 or 3 mm at the confluence of the common femoral vein and external iliac vein on the right. Following balloon angioplasty this measured 13 x 9 mm. A 20 cm treatment length infusion catheter was placed from just above the IVC filter extending down into the external iliac vein on the patient's right.   Procedure:  The patient was identified in the holding area and taken to the operating room where he was placed supine on the operating table general endotracheal anesthesia was induced he was given antibiotics and then flipped prone on the operating table in and timeout called. Using  ultrasound Identified the lesser saphenous vein on the patient's right and this was noncompressible but did have a lumen. This was cannulated with micropuncture needle a wire was placed followed by micropuncture sheath. I then placed a Glidewire and exchanged for 8 [place] sheath. Using ber catheter and Glidewire were able to get into the right innominate vein and exchanged for long Amplatz wire. IVUS was then performed from the popliteal vein all the way up into the IVC with the above findings. A pullback recording was performed. Following this I then performed balloon angioplasty with first 8 mm followed by 12 mm balloon of the stenosis at the external iliac vein and confluence of the common femoral vein. Following this I re-IVIS and noted diameter of 9 mm x 13 mm. I then placed a 20 cm treatment length catheter from above the IVC filter down into the external iliac vein on the patient's right. The infusion wire was then placed through the catheter it was hooked to TPA at 1 mg per hour. The sheath will be hooked to 500 units per hour continuous. The sheath was then sutured in place catheters and sheath were marked and Tegaderm placed. Patient was then flipped supine and allowed awaken from anesthesia having tolerated the procedure well without immediate complication. All instrument counts were correct at completion.   Adlee Paar C. Randie Heinzain, MD [facility] [place] Office: [phone] Pager: [phone]

## 2016-07-11 NOTE — H&P (Signed)
   History and Physical Update  The patient was interviewed and re-examined.  The patient's previous History and Physical has been reviewed and is unchanged from the other day in the ED. Plan for venogram possibly from both popliteal veins, possible lysis, possible pharmacomechanical thrombectom, possible stenting. Patient has been on xarelto.   Bland Rudzinski C. Randie Heinzain, MD [facility] [place] Office: [phone] Pager: [phone]   07/11/2016, 10:36 AM

## 2016-07-11 NOTE — Progress Notes (Signed)
IV team at bedside. DrJoslin updated - no changes

## 2016-07-11 NOTE — Anesthesia Preprocedure Evaluation (Addendum)
Anesthesia Evaluation  Patient identified by MRN, date of birth, ID band Patient awake    Reviewed: Allergy & Precautions, H&P , NPO status , Patient's Chart, lab work & pertinent test results  Airway Mallampati: II  TM Distance: >3 FB Neck ROM: Full    Dental no notable dental hx. (+) Poor Dentition, Dental Advisory Given   Pulmonary Current Smoker,    Pulmonary exam normal breath sounds clear to auscultation       Cardiovascular + Peripheral Vascular Disease and + DVT   Rhythm:Regular Rate:Normal     Neuro/Psych Depression negative neurological ROS  negative psych ROS   GI/Hepatic negative GI ROS, Neg liver ROS, Controlled,  Endo/Other  negative endocrine ROS  Renal/GU negative Renal ROS  negative genitourinary   Musculoskeletal   Abdominal   Peds  Hematology Factor V Leiden deficiency   Anesthesia Other Findings   Reproductive/Obstetrics negative OB ROS                            Anesthesia Physical Anesthesia Plan  ASA: II  Anesthesia Plan: General   Post-op Pain Management:    Induction: Intravenous  Airway Management Planned: Oral ETT  Additional Equipment:   Intra-op Plan:   Post-operative Plan: Extubation in OR  Informed Consent: I have reviewed the patients History and Physical, chart, labs and discussed the procedure including the risks, benefits and alternatives for the proposed anesthesia with the patient or authorized representative who has indicated his/her understanding and acceptance.   Dental advisory given  Plan Discussed with: CRNA  Anesthesia Plan Comments:         Anesthesia Quick Evaluation

## 2016-07-11 NOTE — Transfer of Care (Signed)
Immediate Anesthesia Transfer of Care Note  Patient: [name] [name]  Procedure(s) Performed: Procedure(s): insertion of LYSIS catheter (Right) ULTRASOUND GUIDANCE FOR VASCULAR ACCESS (Right) INTRAVASCULAR ULTRASOUND/IVUS of right popliteal vein, right common femoral vein, right femoral vein, right exterternal iliac vein, and right internal iliac vein (Right) balloon ANGIOPLASTY of Inferior vena cava, right external iliac vein, right common femoral vein (Right)  Patient Location: PACU  Anesthesia Type:General  Level of Consciousness: awake, alert  and oriented  Airway & Oxygen Therapy: Patient Spontanous Breathing and Patient connected to nasal cannula oxygen  Post-op Assessment: Report given to RN and Post -op Vital signs reviewed and stable  Post vital signs: Reviewed and stable  Last Vitals:  Vitals:   07/11/16 1045  BP: 137/82  Pulse: 73  Resp: 18  Temp: 36.4 C    Last Pain:  Vitals:   07/11/16 1506  TempSrc:   PainSc: 5       Patients Stated Pain Goal: 3 (07/11/16 1506)  Complications: No apparent anesthesia complications

## 2016-07-11 NOTE — Progress Notes (Signed)
Follow up call to Dr Randie Heinzain @ [phone] after RN was unable to draw off PIV in L hand- okay per Dr Randie Heinzain   to attempt placing 2nd PIV site for lab draws with TPA & Heparin infusing per orders. One attempt made @ 1935 w/#20  [facility], + flashback, unable to advance angiocath, removed, pressure dressing applied- will monitor site. Call placed to IV team at 1945 to attempt placing saline lock-awaiting arrival of IV team RN.

## 2016-07-11 NOTE — Anesthesia Procedure Notes (Signed)
Procedure Name: Intubation Date/Time: 07/04/2016 4:40 PM Performed by: Eligha Bridegroom Pre-anesthesia Checklist: Patient identified, Emergency Drugs available, Suction available, Patient being monitored and Timeout performed Patient Re-evaluated:Patient Re-evaluated prior to inductionPreoxygenation: Pre-oxygenation with 100% oxygen Intubation Type: IV induction Ventilation: Mask ventilation without difficulty Laryngoscope Size: Mac and 4 Grade View: Grade II Tube size: 8.0 mm Number of attempts: 1 Airway Equipment and Method: Stylet Placement Confirmation: ETT inserted through vocal cords under direct vision,  positive ETCO2 and breath sounds checked- equal and bilateral Secured at: 23 cm Tube secured with: Tape Dental Injury: Teeth and Oropharynx as per pre-operative assessment  Difficulty Due To: Difficult Airway- due to dentition and Difficulty was anticipated

## 2016-07-12 ENCOUNTER — Encounter (HOSPITAL_COMMUNITY): Payer: Self-pay | Admitting: Vascular Surgery

## 2016-07-12 ENCOUNTER — Ambulatory Visit (HOSPITAL_COMMUNITY): Admit: 2016-07-12 | Payer: BLUE CROSS/BLUE SHIELD | Admitting: Surgery

## 2016-07-12 ENCOUNTER — Inpatient Hospital Stay (HOSPITAL_COMMUNITY)
Admission: RE | Disposition: A | Discharge status: Home / Self Care | Payer: Self-pay | Source: Ambulatory Visit | Attending: Vascular Surgery

## 2016-07-12 HISTORY — PX: LOWER EXTREMITY ANGIOGRAPHY: CATH118251

## 2016-07-12 LAB — CBC
HCT: 40.6 % (ref 39.0–52.0)
HCT: 41 % (ref 39.0–52.0)
Hemoglobin: 14 g/dL (ref 13.0–17.0)
Hemoglobin: 14.5 g/dL (ref 13.0–17.0)
MCH: 32.1 pg (ref 26.0–34.0)
MCH: 33.1 pg (ref 26.0–34.0)
MCHC: 34.5 g/dL (ref 30.0–36.0)
MCHC: 35.4 g/dL (ref 30.0–36.0)
MCV: 93.1 fL (ref 78.0–100.0)
MCV: 93.6 fL (ref 78.0–100.0)
PLATELETS: 138 10*3/uL — AB (ref 150–400)
Platelets: 181 10*3/uL (ref 150–400)
RBC: 4.36 MIL/uL (ref 4.22–5.81)
RBC: 4.38 MIL/uL (ref 4.22–5.81)
RDW: 13.5 % (ref 11.5–15.5)
RDW: 13.6 % (ref 11.5–15.5)
WBC: 11.2 10*3/uL — ABNORMAL HIGH (ref 4.0–10.5)
WBC: 7.6 10*3/uL (ref 4.0–10.5)

## 2016-07-12 LAB — BASIC METABOLIC PANEL
ANION GAP: 7 (ref 5–15)
Anion gap: 6 (ref 5–15)
BUN: 9 mg/dL (ref 6–20)
BUN: 9 mg/dL (ref 6–20)
CHLORIDE: 108 mmol/L (ref 101–111)
CO2: 25 mmol/L (ref 22–32)
CO2: 23 mmol/L (ref 22–32)
Calcium: 8.3 mg/dL — ABNORMAL LOW (ref 8.9–10.3)
Calcium: 8.1 mg/dL — ABNORMAL LOW (ref 8.9–10.3)
Chloride: 105 mmol/L (ref 101–111)
Creatinine, Ser: 0.92 mg/dL (ref 0.61–1.24)
Creatinine, Ser: 0.82 mg/dL (ref 0.61–1.24)
GFR calc Af Amer: >60 mL/min (ref >60)
GFR calc Af Amer: >60 mL/min (ref >60)
GFR calc non Af Amer: >60 mL/min (ref >60)
GLUCOSE: 88 mg/dL (ref 65–99)
Glucose, Bld: 86 mg/dL (ref 65–99)
POTASSIUM: 3.9 mmol/L (ref 3.5–5.1)
POTASSIUM: 4.1 mmol/L (ref 3.5–5.1)
SODIUM: 137 mmol/L (ref 135–145)
SODIUM: 137 mmol/L (ref 135–145)

## 2016-07-12 LAB — HEPARIN LEVEL (UNFRACTIONATED)
Heparin Unfractionated: 0.29 IU/mL — ABNORMAL LOW (ref 0.30–0.70)
Heparin Unfractionated: 0.16 IU/mL — ABNORMAL LOW (ref 0.30–0.70)

## 2016-07-12 LAB — MRSA PCR SCREENING: MRSA BY PCR: NEGATIVE

## 2016-07-12 LAB — FIBRINOGEN
FIBRINOGEN: 343 mg/dL (ref 210–475)
Fibrinogen: 341 mg/dL (ref 210–475)

## 2016-07-12 SURGERY — LOWER EXTREMITY ANGIOGRAPHY
Anesthesia: LOCAL

## 2016-07-12 MED ORDER — FENTANYL CITRATE (PF) 100 MCG/2ML IJ SOLN
INTRAMUSCULAR | Status: DC | PRN
  Administered 2016-07-12: 25 ug via INTRAVENOUS
  Administered 2016-07-12: 50 ug via INTRAVENOUS

## 2016-07-12 MED ORDER — MORPHINE SULFATE (PF) 2 MG/ML IV SOLN: 2.0000 mg | INTRAVENOUS | Status: DC | PRN

## 2016-07-12 MED ORDER — HEPARIN (PORCINE) IN NACL 100-0.45 UNIT/ML-% IJ SOLN
1250.0000 [IU]/h | INTRAMUSCULAR | Status: DC
  Administered 2016-07-12: 1000 [IU]/h via INTRAVENOUS
  Administered 2016-07-13: 1250 [IU]/h via INTRAVENOUS
  Filled 2016-07-12: qty 250

## 2016-07-12 MED ORDER — ONDANSETRON HCL 4 MG/2ML IJ SOLN: 4.0000 mg | Freq: Four times a day (QID) | INTRAMUSCULAR | Status: DC | PRN

## 2016-07-12 MED ORDER — SODIUM CHLORIDE 0.9 % IV SOLN
INTRAVENOUS | Status: DC
  Administered 2016-07-13: 06:00:00 via INTRAVENOUS

## 2016-07-12 MED ORDER — SODIUM CHLORIDE 0.9 % IV SOLN: 500.0000 mL | Freq: Once | INTRAVENOUS | Status: DC | PRN

## 2016-07-12 MED ORDER — MIDAZOLAM HCL 2 MG/2ML IJ SOLN
INTRAMUSCULAR | Status: AC
  Filled 2016-07-12: qty 2

## 2016-07-12 MED ORDER — HEPARIN (PORCINE) IN NACL 2-0.9 UNIT/ML-% IJ SOLN
INTRAMUSCULAR | Status: DC | PRN
  Administered 2016-07-12: 1000 mL

## 2016-07-12 MED ORDER — PHENOL 1.4 % MT LIQD: 1.0000 | OROMUCOSAL | Status: DC | PRN

## 2016-07-12 MED ORDER — HYDRALAZINE HCL 20 MG/ML IJ SOLN: 5.0000 mg | INTRAMUSCULAR | Status: DC | PRN

## 2016-07-12 MED ORDER — GUAIFENESIN-DM 100-10 MG/5ML PO SYRP: 15.0000 mL | ORAL_SOLUTION | ORAL | Status: DC | PRN

## 2016-07-12 MED ORDER — IODIXANOL 320 MG/ML IV SOLN
INTRAVENOUS | Status: DC | PRN
Start: 2016-07-12 — End: 2016-07-12
  Administered 2016-07-12: 25 mL

## 2016-07-12 MED ORDER — METOPROLOL TARTRATE 5 MG/5ML IV SOLN: 2.0000 mg | INTRAVENOUS | Status: DC | PRN

## 2016-07-12 MED ORDER — ALUM & MAG HYDROXIDE-SIMETH 200-200-20 MG/5ML PO SUSP: 15.0000 mL | ORAL | Status: DC | PRN

## 2016-07-12 MED ORDER — FENTANYL CITRATE (PF) 100 MCG/2ML IJ SOLN
INTRAMUSCULAR | Status: AC
  Filled 2016-07-12: qty 2

## 2016-07-12 MED ORDER — MIDAZOLAM HCL 2 MG/2ML IJ SOLN
INTRAMUSCULAR | Status: DC | PRN
Start: 2016-07-12 — End: 2016-07-12
  Administered 2016-07-12: 1 mg via INTRAVENOUS
  Administered 2016-07-12: 2 mg via INTRAVENOUS

## 2016-07-12 MED ORDER — LIDOCAINE HCL (PF) 1 % IJ SOLN
INTRAMUSCULAR | Status: AC
  Filled 2016-07-12: qty 30

## 2016-07-12 MED ORDER — OXYCODONE HCL 5 MG PO TABS
5.0000 mg | ORAL_TABLET | ORAL | Status: DC | PRN
  Administered 2016-07-12 – 2016-07-13 (×5): 10 mg via ORAL
  Filled 2016-07-12 (×5): qty 2

## 2016-07-12 MED ORDER — LABETALOL HCL 5 MG/ML IV SOLN: 10.0000 mg | INTRAVENOUS | Status: DC | PRN

## 2016-07-12 MED ORDER — HEPARIN (PORCINE) IN NACL 2-0.9 UNIT/ML-% IJ SOLN
INTRAMUSCULAR | Status: AC
  Filled 2016-07-12: qty 1000

## 2016-07-12 MED ORDER — ACETAMINOPHEN 325 MG PO TABS: 325.0000 mg | ORAL_TABLET | ORAL | Status: DC | PRN

## 2016-07-12 MED ORDER — ACETAMINOPHEN 325 MG RE SUPP: 325.0000 mg | RECTAL | Status: DC | PRN

## 2016-07-12 MED ORDER — DOCUSATE SODIUM 100 MG PO CAPS
100.0000 mg | ORAL_CAPSULE | Freq: Every day | ORAL | Status: DC
  Filled 2016-07-12: qty 1

## 2016-07-12 SURGICAL SUPPLY — 4 items
CATH VISIONS PV .035 IVUS (CATHETERS) ×2 IMPLANT
KIT PV (KITS) ×2 IMPLANT
TRAY PV CATH (CUSTOM PROCEDURE TRAY) ×2 IMPLANT
WIRE AMPLATZ SS-J .035X260CM (WIRE) ×2 IMPLANT

## 2016-07-12 NOTE — Anesthesia Postprocedure Evaluation (Signed)
Anesthesia Post Note  Patient: [name] [name]  Procedure(s) Performed: Procedure(s) (LRB): insertion of LYSIS catheter (Right) ULTRASOUND GUIDANCE FOR VASCULAR ACCESS (Right) INTRAVASCULAR ULTRASOUND/IVUS of right popliteal vein, right common femoral vein, right femoral vein, right exterternal iliac vein, and right internal iliac vein (Right) balloon ANGIOPLASTY of Inferior vena cava, right external iliac vein, right common femoral vein (Right)  Patient location during evaluation: PACU Anesthesia Type: General Level of consciousness: awake and alert Pain management: pain level controlled Vital Signs Assessment: post-procedure vital signs reviewed and stable Respiratory status: spontaneous breathing, nonlabored ventilation, respiratory function stable and patient connected to nasal cannula oxygen Cardiovascular status: blood pressure returned to baseline and stable Postop Assessment: no signs of nausea or vomiting Anesthetic complications: no       Last Vitals:  Vitals:   07/12/16 1740 07/12/16 2007  BP: 128/82 122/68  Pulse: 67 68  Resp: 18 18  Temp:  37.2 C    Last Pain:  Vitals:   07/12/16 2007  TempSrc: Oral  PainSc:                  [name],W. EDMOND

## 2016-07-12 NOTE — Op Note (Signed)
    Patient name: [name] [name] MRN: [id number] DOB: 1980-09-11 Sex: male  07/11/2016 - 07/12/2016 Pre-operative Diagnosis: DVT / IVC filter thrombus Post-operative diagnosis:  Same Surgeon:  Durene CalBrabham, [place] Procedure Performed:  1.  Follow-up thrombolysis study  2.  Intravascular ultrasound ( IVUS) right popliteal, femoral, common femoral, external iliac, common iliac vein, inferior vena cava  3.  Right lower extremity venogram  4.  Inferior venacavogram  5.  Conscious sedation oh (26 minutes)    Indications:  The patient underwent placement of a lytic catheter yesterday.  He also had balloon angioplasty of the right common femoral and external iliac vein.  He has been receiving TPA overnight he comes in today for procedure  Procedure:  The patient was identified in the holding area and taken to room 8.  The patient was then placed supine on the table and prepped and draped in the usual sterile fashion.  A time out was called.  Conscious sedation was administered with the use of IV fentanyl and Versed in a continuous physician and nurse monitoring.  Heart rate and blood pressure and oxygen saturations were continuously monitored.  A Amplatz superstiff wire was inserted through the unifuse catheter and positioned in the subclavian vein.  Next, the IVUS catheter was used to evaluate the right popliteal, femoral, common femoral, external iliac, common iliac vein, and the inferior vena cava.  There appears to be significant improvement in the caliber of the vein with only chronic appearing fibrotic bands in the femoral-popliteal segment with a patent femoral, external iliac vein area.  There was a tail of thrombus that extends above the filter.  There was significant improvement in the luminal channel surrounding the filter.  Next, contrast injections were performed through the sheath in the right popliteal vein.  This shows in-line flow through the popliteal femoral and common femoral veins.  There  may be a residual stenosis in the treated area as there was a few collaterals in this area, however definitive stenosis was not visualized on ultrasound and therefore no intervention was done.  The iliac vein were patent.  There is a in-line flow channel through the filter.  At this point the decision was made to stop the procedure.  Catheters and wires were removed.  The sheath will be old was coagulation profile corrects.  The patient will be transitioned to oral anticoagulation.    Impression:  #1  significantly decreased clot burden within the right leg veins.  #2  the treated area and the right femoral and external iliac vein appears to be widely patent with minimal collaterals around this area.  #3  significant improvement within the luminal flow channel through the vena cava filter.  #4  tongue of thrombus extending above the IVC filter   V. Durene CalWells Tran Arzuaga, M.D. [facility] [place] Office: [phone] Pager:  [phone]

## 2016-07-12 NOTE — H&P (View-Only) (Signed)
  Progress Note    07/12/2016 7:46 AM 1 Day Post-Op  Subjective:  No issues overnigh  Vitals:   07/12/16 0500 07/12/16 0600  BP: 105/61 126/70  Pulse: 65 61  Resp: 10 14  Temp:      Physical Exam: aaox3 Neuro in tact Abdomen is soft Bilateral lower extremities with chronic venous stasis changes R popliteal fossa with sheath in place  CBC    Component Value Date/Time   WBC 11.2 (H) 07/12/2016 0247   RBC 4.36 07/12/2016 0247   HGB 14.0 07/12/2016 0247   HCT 40.6 07/12/2016 0247   PLT 181 07/12/2016 0247   MCV 93.1 07/12/2016 0247   MCH 32.1 07/12/2016 0247   MCHC 34.5 07/12/2016 0247   RDW 13.5 07/12/2016 0247   LYMPHSABS 2.6 09/20/2013 0148   MONOABS 0.9 09/20/2013 0148   EOSABS 0.7 09/20/2013 0148   BASOSABS 0.0 09/20/2013 0148    BMET    Component Value Date/Time   NA 137 07/12/2016 0242   K 3.9 07/12/2016 0242   CL 105 07/12/2016 0242   CO2 25 07/12/2016 0242   GLUCOSE 86 07/12/2016 0242   BUN 9 07/12/2016 0242   CREATININE 0.92 07/12/2016 0242   CALCIUM 8.3 (L) 07/12/2016 0242   GFRNONAA >60 07/12/2016 0242   GFRAA >60 07/12/2016 0242    INR    Component Value Date/Time   INR 1.43 07/11/2016 1055     Intake/Output Summary (Last 24 hours) at 07/12/16 0746 Last data filed at 07/12/16 0600  Gross per 24 hour  Intake             3740 ml  Output             1030 ml  Net             2710 ml     Assessment:  36 y.o. 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 He understands today's procedure and specifically that he might not have any improvement following. Will discuss further options following todays recheck procedure.  Continue lytic until time of procedure.    [name] Jeanpaul C. Daris Harkins,  MD [facility] [place] Office: [phone] Pager: [phone]  07/12/2016 7:46 AM  

## 2016-07-12 NOTE — Progress Notes (Signed)
ANTICOAGULATION CONSULT NOTE - Initial Consult  Pharmacy Consult for heparin Indication: DVT  Allergies  Allergen Reactions  . No Known Allergies     Patient Measurements: Weight: 250 lb (113.4 kg) Heparin Dosing Weight: 101  Vital Signs: Temp: 98 F (36.7 C) (03/27 1100) Temp Source: Oral (03/27 1100) BP: 128/82 (03/27 1740) Pulse Rate: 67 (03/27 1740)  Labs:  Recent Labs  07/11/16 1055  07/11/16 2035 07/12/16 0242 07/12/16 0244 07/12/16 0247 07/12/16 0848  HGB  --   < > 15.9  --   --  14.0 14.5  HCT  --   < > 45.5  --   --  40.6 41.0  PLT  --   --  200  --   --  181 138*  LABPROT 17.5*  --   --   --   --   --   --   INR 1.43  --   --   --   --   --   --   HEPARINUNFRC  --   --  1.06*  --  0.29*  --  0.16*  CREATININE  --   --   --  0.92  --   --  0.82  < > = values in this interval not displayed.  Estimated Creatinine Clearance: 158.8 mL/min (by C-G formula based on SCr of 0.82 mg/dL).   Medical History: Past Medical History:  Diagnosis Date  . Anxiety and depression   . DVT (deep venous thrombosis) (HCC)    Pt unsure of date   . Factor V Leiden mutation (HCC)    With resultant hypercoaguable state, Hx of DVT, PE, SP IVC filter placement, on chronic coumadin and requires high doses to maintine IRN 2-3.  Marland Kitchen. GERD (gastroesophageal reflux disease)    history of  . Myocardial infarction   . PE (pulmonary thromboembolism) (HCC)    unsure of date  . Pneumonia   . Pneumothorax 8/11   HX of, requiring chest tube/hospitalization.   . Venous stasis    bilateral LE.    Assessment: 36 yo male with acute on chronic thrombosis of IVC filter. S/p treatment with alteplase and low dose heparin. Thrombolysis study revealed significant decrease in clot burden and to resume heparin infusion tonight at 21:00.   Goal of Therapy:  Heparin level 0.3-0.7 units/ml Monitor platelets by anticoagulation protocol: Yes   Plan:  1. Begin heparin infusion at 1000 units/hr   2. Heparin level in 6 hours after starting  3. Daily heparin level and CBC  Pollyann SamplesAndy Josuha Fontanez, PharmD, BCPS 07/12/2016, 6:22 PM

## 2016-07-12 NOTE — Interval H&P Note (Signed)
History and Physical Interval Note:  07/12/2016 3:20 PM  [name] [name]  has presented today for surgery, with the diagnosis of recheck  The various methods of treatment have been discussed with the patient and family. After consideration of risks, benefits and other options for treatment, the patient has consented to  Procedure(s): LYSIS RECHECK (N/A) as a surgical intervention .  The patient's history has been reviewed, patient examined, no change in status, stable for surgery.  I have reviewed the patient's chart and labs.  Questions were answered to the patient's satisfaction.     Durene CalBrabham, [place]

## 2016-07-12 NOTE — Progress Notes (Signed)
  Progress Note    07/12/2016 7:46 AM 1 Day Post-Op  Subjective:  No issues overnigh  Vitals:   07/12/16 0500 07/12/16 0600  BP: 105/61 126/70  Pulse: 65 61  Resp: 10 14  Temp:      Physical Exam: aaox3 Neuro in tact Abdomen is soft Bilateral lower extremities with chronic venous stasis changes R popliteal fossa with sheath in place  CBC    Component Value Date/Time   WBC 11.2 (H) 07/12/2016 0247   RBC 4.36 07/12/2016 0247   HGB 14.0 07/12/2016 0247   HCT 40.6 07/12/2016 0247   PLT 181 07/12/2016 0247   MCV 93.1 07/12/2016 0247   MCH 32.1 07/12/2016 0247   MCHC 34.5 07/12/2016 0247   RDW 13.5 07/12/2016 0247   LYMPHSABS 2.6 09/20/2013 0148   MONOABS 0.9 09/20/2013 0148   EOSABS 0.7 09/20/2013 0148   BASOSABS 0.0 09/20/2013 0148    BMET    Component Value Date/Time   NA 137 07/12/2016 0242   K 3.9 07/12/2016 0242   CL 105 07/12/2016 0242   CO2 25 07/12/2016 0242   GLUCOSE 86 07/12/2016 0242   BUN 9 07/12/2016 0242   CREATININE 0.92 07/12/2016 0242   CALCIUM 8.3 (L) 07/12/2016 0242   GFRNONAA >60 07/12/2016 0242   GFRAA >60 07/12/2016 0242    INR    Component Value Date/Time   INR 1.43 07/11/2016 1055     Intake/Output Summary (Last 24 hours) at 07/12/16 0746 Last data filed at 07/12/16 0600  Gross per 24 hour  Intake             3740 ml  Output             1030 ml  Net             2710 ml     Assessment:  36 y.o. 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 He understands today's procedure and specifically that he might not have any improvement following. Will discuss further options following todays recheck procedure.  Continue lytic until time of procedure.    Kamerin Grumbine C. Randie Heinzain,  MD [facility] [place] Office: [phone] Pager: [phone]  07/12/2016 7:46 AM

## 2016-07-13 ENCOUNTER — Other Ambulatory Visit: Payer: Self-pay | Admitting: *Deleted

## 2016-07-13 ENCOUNTER — Encounter (HOSPITAL_COMMUNITY): Payer: Self-pay | Admitting: Surgery

## 2016-07-13 DIAGNOSIS — D6859 Other primary thrombophilia: Secondary | ICD-10-CM

## 2016-07-13 DIAGNOSIS — I82409 Acute embolism and thrombosis of unspecified deep veins of unspecified lower extremity: Secondary | ICD-10-CM

## 2016-07-13 LAB — CBC
HEMATOCRIT: 40.7 % (ref 39.0–52.0)
Hemoglobin: 14.2 g/dL (ref 13.0–17.0)
MCH: 31.8 pg (ref 26.0–34.0)
MCHC: 34.9 g/dL (ref 30.0–36.0)
MCV: 91.1 fL (ref 78.0–100.0)
Platelets: 188 10*3/uL (ref 150–400)
RBC: 4.47 MIL/uL (ref 4.22–5.81)
RDW: 12.9 % (ref 11.5–15.5)
WBC: 8.4 10*3/uL (ref 4.0–10.5)

## 2016-07-13 LAB — BASIC METABOLIC PANEL
ANION GAP: 8 (ref 5–15)
BUN: 10 mg/dL (ref 6–20)
CO2: 26 mmol/L (ref 22–32)
Calcium: 8.6 mg/dL — ABNORMAL LOW (ref 8.9–10.3)
Chloride: 104 mmol/L (ref 101–111)
Creatinine, Ser: 0.84 mg/dL (ref 0.61–1.24)
GFR calc non Af Amer: >60 mL/min (ref >60)
Glucose, Bld: 109 mg/dL — ABNORMAL HIGH (ref 65–99)
POTASSIUM: 3.7 mmol/L (ref 3.5–5.1)
Sodium: 138 mmol/L (ref 135–145)

## 2016-07-13 LAB — APTT: aPTT: 40 seconds — ABNORMAL HIGH (ref 24–36)

## 2016-07-13 LAB — HEPARIN LEVEL (UNFRACTIONATED): Heparin Unfractionated: 0.22 IU/mL — ABNORMAL LOW (ref 0.30–0.70)

## 2016-07-13 MED ORDER — RIVAROXABAN 20 MG PO TABS: 20.0000 mg | ORAL_TABLET | Freq: Every day | ORAL | 2 refills | Status: DC

## 2016-07-13 MED ORDER — RIVAROXABAN 15 MG PO TABS: 15.0000 mg | ORAL_TABLET | Freq: Two times a day (BID) | ORAL | Status: DC

## 2016-07-13 MED ORDER — OXYCODONE HCL 5 MG PO TABS: 5.0000 mg | ORAL_TABLET | Freq: Four times a day (QID) | ORAL | 0 refills | Status: DC | PRN

## 2016-07-13 MED FILL — Lidocaine HCl Local Preservative Free (PF) Inj 1%: INTRAMUSCULAR | Qty: 30 | Status: AC

## 2016-07-13 NOTE — Progress Notes (Addendum)
  Progress Note    07/13/2016 7:56 AM 1 Day Post-Op  Subjective:  No complaints  afebrile  Vitals:   07/12/16 2007 07/13/16 0536  BP: 122/68 123/66  Pulse: 68 65  Resp: 18 20  Temp: 98.9 F (37.2 C) 98.8 F (37.1 C)    Physical Exam: Lungs:  Non labored Incisions:  Popliteal fossa looks fine Extremities:  Chronic venous stasis changes   CBC    Component Value Date/Time   WBC 8.4 07/13/2016 0423   RBC 4.47 07/13/2016 0423   HGB 14.2 07/13/2016 0423   HCT 40.7 07/13/2016 0423   PLT 188 07/13/2016 0423   MCV 91.1 07/13/2016 0423   MCH 31.8 07/13/2016 0423   MCHC 34.9 07/13/2016 0423   RDW 12.9 07/13/2016 0423   LYMPHSABS 2.6 09/20/2013 0148   MONOABS 0.9 09/20/2013 0148   EOSABS 0.7 09/20/2013 0148   BASOSABS 0.0 09/20/2013 0148    BMET    Component Value Date/Time   NA 138 07/13/2016 0423   K 3.7 07/13/2016 0423   CL 104 07/13/2016 0423   CO2 26 07/13/2016 0423   GLUCOSE 109 (H) 07/13/2016 0423   BUN 10 07/13/2016 0423   CREATININE 0.84 07/13/2016 0423   CALCIUM 8.6 (L) 07/13/2016 0423   GFRNONAA >60 07/13/2016 0423   GFRAA >60 07/13/2016 0423    INR    Component Value Date/Time   INR 1.43 07/11/2016 1055     Intake/Output Summary (Last 24 hours) at 07/13/16 0756 Last data filed at 07/12/16 2145  Gross per 24 hour  Intake              360 ml  Output             3100 ml  Net            -2740 ml     Assessment:  36 y.o. male is s/p:  Procedure Performed:             1.  Follow-up thrombolysis study             2.  Intravascular ultrasound ( IVUS) right popliteal, femoral, common femoral, external iliac, common iliac vein, inferior vena cava             3.  Right lower extremity venogram             4.  Inferior venacavogram             5.  Conscious sedation oh (26 minutes)  1 Day Post-Op  Plan: -pt doing well this am and will be discharged.   -DVT prophylaxis:  On heparin gtt-will give a dose of Xarelto 15mg  prior to dc-he has  starter pack that he received on Friday.  Will give Rx for Xarelto 20mg  daily for when he finishes the starter pack.  -f/u in 4 weeks with CT venogram with Dr. Randie Heinzain -discharge home today.   Doreatha MassedSamantha Rhyne, PA-C [facility] [phone] 07/13/2016 7:56 AM  I have independently interviewed patient and agree with PA assessment and plan above. Ok for discharge and will f/u in 4 weeks with CTV to consider removing ivc filter. Continue xarelto lifelong for factor v leiden history. He agrees to get compression stockings 20-7030mmHg.   Saloma Cadena C. Randie Heinzain, MD [facility] [place] Office: [phone] Pager: [phone]

## 2016-07-13 NOTE — Progress Notes (Signed)
ANTICOAGULATION CONSULT NOTE - Follow Up Consult  Pharmacy Consult for heparin Indication: DVT  Labs:  Recent Labs  07/11/16 1055  07/12/16 0242 07/12/16 0244 07/12/16 0247 07/12/16 0848 07/13/16 0423  HGB  --   < >  --   --  14.0 14.5 14.2  HCT  --   < >  --   --  40.6 41.0 40.7  PLT  --   < >  --   --  181 138* 188  LABPROT 17.5*  --   --   --   --   --   --   INR 1.43  --   --   --   --   --   --   HEPARINUNFRC  --   < >  --  0.29*  --  0.16* 0.22*  CREATININE  --   --  0.92  --   --  0.82 0.84  < > = values in this interval not displayed.   Assessment: 36yo male subtherapeutic on heparin after resumed post-op.  Goal of Therapy:  Heparin level 0.3-0.7 units/ml   Plan:  Will increase heparin gtt by 2 units/kg/hr to 1250 units/hr and check level in 6hr.  Vernard GamblesVeronda Jaquavian Firkus, PharmD, BCPS  07/13/2016,5:35 AM

## 2016-07-13 NOTE — Discharge Summary (Signed)
Discharge Summary    [name] [name] 1980/12/22 36 y.o. male  [id number]  Admission Date: 07/11/2016  Discharge Date: 07/13/16  Physician: Maeola Harman*  Admission Diagnosis: DVT (deep venous thrombosis) (HCC) [I82.409]   HPI:   This is a 36 y.o. male  with a PMH of factor V Leiden, PE, and multiple DVTs who is s/p IVC filter placement 9-10 years ago presents today complaining of a recurrent right lower extremity clot. He states that the clot started forming about a week ago after he fell asleep in a chair. Since the time, the patient reports that the medial aspect of his thigh just above the knee has become more erythematous and painful to the touch. Last anticoagulation was with Xarelto about 6 months ago.  He has also taken Coumadin in the past.  He does have a history of venous stasis ulcers.  No other pertinent medical history.    Hospital Course:  The patient was admitted to the hospital and taken to the operating room on 07/11/2016 - 07/12/2016 and underwent: Procedure Performed: 1.  US guided cannulation of right lesser saphenous vein 2.  IVUS of right popliteal, femoral, common femoral, external iliac, common iliac veins and ivc 3.  Balloon angioplasty of left external iliac vein and common femoral vein with 12mm balloon 4.  Placement of 20cm treatment length into ivc and iliac veins  Intraoperative findings are as follows: Findings: The lesser saphenous vein was cannulated on the patient's right and we are able to get access into the superior vena cava and right sided innominate vein. I was demonstrated acute thrombus at the level of the existing IVC filter and somewhat above the filter filling at least 70% of it. There was also residual thrombus in the common iliac vein on the right and evidence of chronic thrombus down into the femoral vein. There was a tight stenosis approximately 2 or 3 mm at the confluence of the common femoral vein and external iliac vein  on the right. Following balloon angioplasty this measured 13 x 9 mm. A 20 cm treatment length infusion catheter was placed from just above the IVC filter extending down into the external iliac vein on the patient's right.  The pt tolerated the procedure well and was transported to the PACU in good condition.   By the next morning, Dr. Randie Heinz discussed with the pt possible outcomes including resolution of the acute clot and possibility of no improvement that might necessitate filter removal with significant chronic thrombus burden. He understands today's procedure and specifically that he might not have any improvement following. Will discuss further options following todays recheck procedure.  Continue lytic until time of procedure.   He was taken back to the operating room on 07/12/16 and underwent: Procedure Performed: 1.  Follow-up thrombolysis study 2.  Intravascular ultrasound ( IVUS) right popliteal, femoral, common femoral, external iliac, common iliac vein, inferior vena cava 3.  Right lower extremity venogram 4.  Inferior venacavogram 5.  Conscious sedation oh (26 minutes)  Impression: #1  significantly decreased clot burden within the right leg veins. #2  the treated area and the right femoral and external iliac vein appears to be widely patent with minimal collaterals around this area. #3  significant improvement within the luminal flow channel through the vena cava filter. #4  tongue of thrombus extending above the IVC filter  On 07/13/16, the pt was doing well and was given a Xarelto starter pack and Xarelto 20mg  rx to start once the  starter pack is completed.   He agrees to get compression stockings 20-78mmHg.  He will have a CT venogram in 4-5 weeks to consider removing IVC filter.  He will need Xarelto lifelong for Factor V Leiden hx.   The remainder of the hospital course consisted of increasing mobilization and increasing intake of solids without difficulty.  CBC      Component Value Date/Time   WBC 8.4 07/13/2016 0423   RBC 4.47 07/13/2016 0423   HGB 14.2 07/13/2016 0423   HCT 40.7 07/13/2016 0423   PLT 188 07/13/2016 0423   MCV 91.1 07/13/2016 0423   MCH 31.8 07/13/2016 0423   MCHC 34.9 07/13/2016 0423   RDW 12.9 07/13/2016 0423   LYMPHSABS 2.6 09/20/2013 0148   MONOABS 0.9 09/20/2013 0148   EOSABS 0.7 09/20/2013 0148   BASOSABS 0.0 09/20/2013 0148    BMET    Component Value Date/Time   NA 138 07/13/2016 0423   K 3.7 07/13/2016 0423   CL 104 07/13/2016 0423   CO2 26 07/13/2016 0423   GLUCOSE 109 (H) 07/13/2016 0423   BUN 10 07/13/2016 0423   CREATININE 0.84 07/13/2016 0423   CALCIUM 8.6 (L) 07/13/2016 0423   GFRNONAA >60 07/13/2016 0423   GFRAA >60 07/13/2016 0423      Discharge Instructions    Call MD for:  redness, tenderness, or signs of infection (pain, swelling, bleeding, redness, odor or green/yellow discharge around incision site)    Complete by:  As directed    Call MD for:  severe or increased pain, loss or decreased feeling  in affected limb(s)    Complete by:  As directed    Call MD for:  temperature >100.5    Complete by:  As directed    Discharge wound care:    Complete by:  As directed    Shower daily with soap and water starting 07/13/16   Driving Restrictions    Complete by:  As directed    Do not drive for 48 hours and while taking pain medication.   Lifting restrictions    Complete by:  As directed    No heavy lifting for 2 weeks   Resume previous diet    Complete by:  As directed       Discharge Diagnosis:  DVT (deep venous thrombosis) (HCC) [I82.409]  Secondary Diagnosis: Patient Active Problem List   Diagnosis Date Noted  . DVT (deep venous thrombosis) (HCC)   . PE (pulmonary thromboembolism) (HCC)   . Long term current use of anticoagulant 05/09/2010  . Dysthymic disorder 11/24/2009  . ULCER OF OTHER PART OF LOWER LIMB 10/28/2008  . PRIMARY HYPERCOAGULABLE STATE 12/18/2007  . DVT 12/13/2007    Past Medical History:  Diagnosis Date  . Anxiety and depression   . DVT (deep venous thrombosis) (HCC)    Pt unsure of date   . Factor V Leiden mutation (HCC)    With resultant hypercoaguable state, Hx of DVT, PE, SP IVC filter placement, on chronic coumadin and requires high doses to maintine IRN 2-3.  Marland Kitchen GERD (gastroesophageal reflux disease)    history of  . Myocardial infarction   . PE (pulmonary thromboembolism) (HCC)    unsure of date  . Pneumonia   . Pneumothorax 8/11   HX of, requiring chest tube/hospitalization.   . Venous stasis    bilateral LE.     Allergies as of 07/13/2016      Reactions   No Known Allergies  Medication List    TAKE these medications   acetaminophen 500 MG tablet Commonly known as:  TYLENOL Take 1 tablet (500 mg total) by mouth every 6 (six) hours as needed.   amoxicillin-clavulanate 875-125 MG tablet Commonly known as:  AUGMENTIN Take 1 tablet by mouth every 12 (twelve) hours.   HYDROcodone-acetaminophen 5-325 MG tablet Commonly known as:  NORCO/VICODIN Take 2 tablets by mouth every 4 (four) hours as needed.   oxyCODONE 5 MG immediate release tablet Commonly known as:  Oxy IR/ROXICODONE Take 1 tablet (5 mg total) by mouth every 6 (six) hours as needed for moderate pain.   Rivaroxaban 15 & 20 MG Tbpk Take as directed on package: Start with one 15mg  tablet by mouth twice a day with food. On Day 22, switch to one 20mg  tablet once a day with food. What changed:  Another medication with the same name was added. Make sure you understand how and when to take each.   rivaroxaban 20 MG Tabs tablet Commonly known as:  XARELTO Take 1 tablet (20 mg total) by mouth daily with supper. What changed:  You were already taking a medication with the same name, and this prescription was added. Make sure you understand how and when to take each.       Prescriptions given: Oxycodone #8 No Refill Xarelto 20mg  daily #30 2RF (start when starter  pack is finished.  Instructions: 1.  No driving for 48 hours and while taking pain medication 2.  No heavy lifting x 2 weeks 3.  Shower daily starting 07/13/16  Disposition: home  Patient's condition: is Good  Follow up: 1. Dr. Ellery Plunkani in 4 weeks with CT venogram   Doreatha MassedSamantha Jayvian Escoe, PA-C [facility] [phone] 07/13/2016  8:09 AM

## 2016-07-13 NOTE — Discharge Instructions (Signed)
Information on my medicine - XARELTO® (rivaroxaban) ° °This medication education was reviewed with me or my healthcare representative as part of my discharge preparation.   ° °WHY WAS XARELTO® PRESCRIBED FOR YOU? °Xarelto® was prescribed to treat blood clots that may have been found in the veins of your legs (deep vein thrombosis) or in your lungs (pulmonary embolism) and to reduce the risk of them occurring again. ° °What do you need to know about Xarelto®? °The starting dose is one 15 mg tablet taken TWICE daily with food for the FIRST 21 DAYS then the dose is changed to one 20 mg tablet taken ONCE A DAY with your evening meal. ° °DO NOT stop taking Xarelto® without talking to the health care provider who prescribed the medication.  Refill your prescription for 20 mg tablets before you run out. ° °After discharge, you should have regular check-up appointments with your healthcare provider that is prescribing your Xarelto®.  In the future your dose may need to be changed if your kidney function changes by a significant amount. ° °What do you do if you miss a dose? °If you are taking Xarelto® TWICE DAILY and you miss a dose, take it as soon as you remember. You may take two 15 mg tablets (total 30 mg) at the same time then resume your regularly scheduled 15 mg twice daily the next day. ° °If you are taking Xarelto® ONCE DAILY and you miss a dose, take it as soon as you remember on the same day then continue your regularly scheduled once daily regimen the next day. Do not take two doses of Xarelto® at the same time.  ° °Important Safety Information °Xarelto® is a blood thinner medicine that can cause bleeding. You should call your healthcare provider right away if you experience any of the following: °? Bleeding from an injury or your nose that does not stop. °? Unusual colored urine (red or dark brown) or unusual colored stools (red or black). °? Unusual bruising for unknown reasons. °? A serious fall or if you hit  your head (even if there is no bleeding). ° °Some medicines may interact with Xarelto® and might increase your risk of bleeding while on Xarelto®. To help avoid this, consult your healthcare provider or pharmacist prior to using any new prescription or non-prescription medications, including herbals, vitamins, non-steroidal anti-inflammatory drugs (NSAIDs) and supplements. ° °This website has more information on Xarelto®: www.xarelto.com. °

## 2016-07-13 NOTE — Progress Notes (Signed)
ANTICOAGULATION CONSULT NOTE - Initial Consult  Pharmacy Consult for Xarelto Indication: DVT  Allergies  Allergen Reactions  . No Known Allergies     Patient Measurements: Weight: 250 lb (113.4 kg) Heparin Dosing Weight: 101  Vital Signs: Temp: 98.8 F (37.1 C) (03/28 0536) Temp Source: Oral (03/28 0536) BP: 123/66 (03/28 0536) Pulse Rate: 65 (03/28 0536)  Labs:  Recent Labs  07/11/16 1055  07/12/16 0242 07/12/16 0244 07/12/16 0247 07/12/16 0848 07/13/16 0423 07/13/16 1101  HGB  --   < >  --   --  14.0 14.5 14.2  --   HCT  --   < >  --   --  40.6 41.0 40.7  --   PLT  --   < >  --   --  181 138* 188  --   APTT  --   --   --   --   --   --   --  40*  LABPROT 17.5*  --   --   --   --   --   --   --   INR 1.43  --   --   --   --   --   --   --   HEPARINUNFRC  --   < >  --  0.29*  --  0.16* 0.22*  --   CREATININE  --   --  0.92  --   --  0.82 0.84  --   < > = values in this interval not displayed.  Estimated Creatinine Clearance: 155 mL/min (by C-G formula based on SCr of 0.84 mg/dL).   Medical History: Past Medical History:  Diagnosis Date  . Anxiety and depression   . DVT (deep venous thrombosis) (HCC)    Pt unsure of date   . Factor V Leiden mutation (HCC)    With resultant hypercoaguable state, Hx of DVT, PE, SP IVC filter placement, on chronic coumadin and requires high doses to maintine IRN 2-3.  Marland Kitchen. GERD (gastroesophageal reflux disease)    history of  . Myocardial infarction   . PE (pulmonary thromboembolism) (HCC)    unsure of date  . Pneumonia   . Pneumothorax 8/11   HX of, requiring chest tube/hospitalization.   . Venous stasis    bilateral LE.   Assessment: 36 yo male with acute on chronic thrombosis of IVC filter. S/p treatment with alteplase and low dose heparin. Thrombolysis study revealed significant decrease in clot burden and heparin infusion resumed last night at 2100. The 1130 morning aPTT was collected but not processed by the lab until  after 1330. The aPTT is subtherapeutic at 40. The heparin level could not be processed by the lab as it had been setting for more than two hours. Vascular has asked to transition patient to Xarelto in preparation for discharge and to give fist Xarelto dose this evening. Recommended to keep heparin infusing for 24 hours while pharmacy monitors aPTT and heparin levels. The patient should be restarted on the 21 day Xarelto  load since patient has had interruption in therapy during his admission.   Goal of Therapy:  Heparin level 0.3-0.7 units/ml Monitor platelets by anticoagulation protocol: Yes   Plan:  1. Increase heparin gtt to 1450 units/hr (~14.5 units/kr/hr) 2. aPTT/Heparin level every 6 hours after starting  3. Daily heparin level and CBC 4. Xarelto to be started this evening once heparin gtt stopped  Ruben Imony Brynja Marker, PharmD Clinical Pharmacist Pager: [phone] 07/13/2016 1:50 PM

## 2016-07-13 NOTE — Progress Notes (Signed)
Per insurance check for Xarelto-  S/W  [facility] @ [facility] # [phone] OPT- 3   XARELTO 20 MG DAILY   COVER- YES  CO-PAY- $ 70.00  PRIOR APPROVAL- NO   PHARMACY : [facility]

## 2016-07-13 NOTE — Progress Notes (Signed)
07/13/2016 1540 Discharge AVS meds taken today and those due this evening reviewed.  Follow-up appointments and when to call md reviewed.  D/C IV and TELE.  Questions and concerns addressed.   D/C home per orders. Kathryne HitchAllen, Christie Viscomi C

## 2016-07-13 NOTE — Progress Notes (Signed)
ANTICOAGULATION CONSULT NOTE - Initial Consult  Pharmacy Consult for Xarelto Indication: DVT  Allergies  Allergen Reactions  . No Known Allergies     Patient Measurements: Weight: 250 lb (113.4 kg) Heparin Dosing Weight: 101  Vital Signs: Temp: 98.8 F (37.1 C) (03/28 0536) Temp Source: Oral (03/28 0536) BP: 123/66 (03/28 0536) Pulse Rate: 65 (03/28 0536)  Labs:  Recent Labs  07/11/16 1055  07/12/16 0242 07/12/16 0244 07/12/16 0247 07/12/16 0848 07/13/16 0423  HGB  --   < >  --   --  14.0 14.5 14.2  HCT  --   < >  --   --  40.6 41.0 40.7  PLT  --   < >  --   --  181 138* 188  LABPROT 17.5*  --   --   --   --   --   --   INR 1.43  --   --   --   --   --   --   HEPARINUNFRC  --   < >  --  0.29*  --  0.16* 0.22*  CREATININE  --   --  0.92  --   --  0.82 0.84  < > = values in this interval not displayed.  Estimated Creatinine Clearance: 155 mL/min (by C-G formula based on SCr of 0.84 mg/dL).   Medical History: Past Medical History:  Diagnosis Date  . Anxiety and depression   . DVT (deep venous thrombosis) (HCC)    Pt unsure of date   . Factor V Leiden mutation (HCC)    With resultant hypercoaguable state, Hx of DVT, PE, SP IVC filter placement, on chronic coumadin and requires high doses to maintine IRN 2-3.  Marland Kitchen. GERD (gastroesophageal reflux disease)    history of  . Myocardial infarction   . PE (pulmonary thromboembolism) (HCC)    unsure of date  . Pneumonia   . Pneumothorax 8/11   HX of, requiring chest tube/hospitalization.   . Venous stasis    bilateral LE.   Assessment: 36 yo male with acute on chronic thrombosis of IVC filter. S/p treatment with alteplase and low dose heparin. Thrombolysis study revealed significant decrease in clot burden and heparin infusion resumed last night at 2100. The 6 hour morning heparin level was subtherapeutic, and overnight pharmacist increased the rate. Vascular has asked to transition patient to Xarelto in preparation for  discharge and to give fist Xarelto dose this evening. Recommended to keep heparin infusing for 24 hours while pharmacy monitors aPTT and heparin levels. The patient should be restarted on the 21 day Xarelto  load since patient has had interruption in therapy during his admission.   Goal of Therapy:  Heparin level 0.3-0.7 units/ml Monitor platelets by anticoagulation protocol: Yes   Plan:  1. Continue heparin gtt at 1250 units/hr  2. aPTT/Heparin level every 6 hours after starting  3. Daily heparin level and CBC 4. Xarelto to be started this evening once heparin gtt stopped  [name] [name], PharmD Clinical Pharmacist Pager: [phone] 07/13/2016 9:01 AM

## 2016-07-13 NOTE — Care Management Note (Signed)
Case Management Note Donn PieriniKristi Jevonte Clanton RN, BSN Unit 2W-Case Manager [phone]  Patient Details  Name: [name] Sjogrenric T Cuda MRN: [id number] Date of Birth: 07-05-1980  Subjective/Objective:  Pt admitted with DVT                  Action/Plan: PTA pt lived at home- independent- pt going home on Xarelto- insurance check completed- copay is 63$70- spoke with pt at bedside- per conversation pt states that he has starter pack at home already- [facility]- and pt did pay his $70 copay for it- pt given 30 day free card to see if he can go back and get reimbursed for his copay - he does not think he has ever used the 30 day free card- pt also given the copay assist card to use on his next refill.   Expected Discharge Date:  07/13/16               Expected Discharge Plan:  Home/Self Care  In-House Referral:     Discharge planning Services  CM Consult, Medication Assistance  Post Acute Care Choice:  NA Choice offered to:  NA  DME Arranged:    DME Agency:     HH Arranged:    HH Agency:     Status of Service:  Completed, signed off  If discussed at Long Length of Stay Meetings, dates discussed:    Discharge Disposition: home/self care   Additional Comments:  Darrold SpanWebster, Antigone Crowell Hall, RN 07/13/2016, 12:09 PM

## 2016-07-14 ENCOUNTER — Telehealth: Payer: Self-pay | Admitting: Vascular Surgery

## 2016-07-14 LAB — POCT ACTIVATED CLOTTING TIME: Activated Clotting Time: 131 seconds

## 2016-07-14 NOTE — Telephone Encounter (Signed)
-----   Message from Sharee PimpleMarilyn K McChesney, RN sent at 07/13/2016  9:26 AM EDT ----- Regarding: 4-5 weeks w/ CT venogram   ----- Message ----- From: Dara LordsSamantha J Rhyne, PA-C Sent: 07/13/2016   8:08 AM To: Vvs Charge Pool  s/p lysis DVT.  f/u with Dr. Randie Heinzain (MD only) with CT venogram in 4-5 weeks.  Thanks, Lelon MastSamantha

## 2016-07-14 NOTE — Telephone Encounter (Signed)
LVM on home # for appts, will mail letter with instructions for appts for 4/26 CTA and 4/27 PO

## 2016-08-05 ENCOUNTER — Encounter: Payer: Self-pay | Admitting: Vascular Surgery

## 2016-08-11 ENCOUNTER — Inpatient Hospital Stay: Admission: RE | Admit: 2016-08-11 | Payer: BLUE CROSS/BLUE SHIELD | Source: Ambulatory Visit

## 2016-08-12 ENCOUNTER — Encounter: Payer: BLUE CROSS/BLUE SHIELD | Admitting: Vascular Surgery

## 2016-10-03 ENCOUNTER — Emergency Department (HOSPITAL_COMMUNITY): Payer: BLUE CROSS/BLUE SHIELD

## 2016-10-03 ENCOUNTER — Encounter (HOSPITAL_COMMUNITY): Payer: Self-pay

## 2016-10-03 DIAGNOSIS — Z79899 Other long term (current) drug therapy: Secondary | ICD-10-CM | POA: Insufficient documentation

## 2016-10-03 DIAGNOSIS — M79605 Pain in left leg: Secondary | ICD-10-CM | POA: Insufficient documentation

## 2016-10-03 DIAGNOSIS — I252 Old myocardial infarction: Secondary | ICD-10-CM | POA: Insufficient documentation

## 2016-10-03 DIAGNOSIS — Z7901 Long term (current) use of anticoagulants: Secondary | ICD-10-CM | POA: Diagnosis not present

## 2016-10-03 DIAGNOSIS — R51 Headache: Secondary | ICD-10-CM | POA: Diagnosis present

## 2016-10-03 DIAGNOSIS — Z87891 Personal history of nicotine dependence: Secondary | ICD-10-CM | POA: Insufficient documentation

## 2016-10-03 LAB — CBC WITH DIFFERENTIAL/PLATELET
BASOS ABS: 0 10*3/uL (ref 0.0–0.1)
BASOS PCT: 0 %
EOS ABS: 0.5 10*3/uL (ref 0.0–0.7)
EOS PCT: 4 %
HCT: 41.3 % (ref 39.0–52.0)
Hemoglobin: 14.4 g/dL (ref 13.0–17.0)
LYMPHS ABS: 2.2 10*3/uL (ref 0.7–4.0)
Lymphocytes Relative: 19 %
MCH: 32.3 pg (ref 26.0–34.0)
MCHC: 34.9 g/dL (ref 30.0–36.0)
MCV: 92.6 fL (ref 78.0–100.0)
Monocytes Absolute: 1.2 10*3/uL — ABNORMAL HIGH (ref 0.1–1.0)
Monocytes Relative: 10 %
Neutro Abs: 8 10*3/uL — ABNORMAL HIGH (ref 1.7–7.7)
Neutrophils Relative %: 67 %
PLATELETS: 208 10*3/uL (ref 150–400)
RBC: 4.46 MIL/uL (ref 4.22–5.81)
RDW: 13.1 % (ref 11.5–15.5)
WBC: 11.9 10*3/uL — AB (ref 4.0–10.5)

## 2016-10-03 LAB — COMPREHENSIVE METABOLIC PANEL
ALK PHOS: 78 U/L (ref 38–126)
ALT: 27 U/L (ref 17–63)
AST: 25 U/L (ref 15–41)
Albumin: 3.9 g/dL (ref 3.5–5.0)
Anion gap: 8 (ref 5–15)
BUN: 9 mg/dL (ref 6–20)
CALCIUM: 9.3 mg/dL (ref 8.9–10.3)
CO2: 25 mmol/L (ref 22–32)
CREATININE: 0.97 mg/dL (ref 0.61–1.24)
Chloride: 102 mmol/L (ref 101–111)
GFR calc Af Amer: >60 mL/min (ref >60)
Glucose, Bld: 113 mg/dL — ABNORMAL HIGH (ref 65–99)
Potassium: 3.4 mmol/L — ABNORMAL LOW (ref 3.5–5.1)
Sodium: 135 mmol/L (ref 135–145)
TOTAL PROTEIN: 7 g/dL (ref 6.5–8.1)
Total Bilirubin: 0.6 mg/dL (ref 0.3–1.2)

## 2016-10-03 LAB — URINALYSIS, ROUTINE W REFLEX MICROSCOPIC
Bilirubin Urine: NEGATIVE
Glucose, UA: NEGATIVE mg/dL
HGB URINE DIPSTICK: NEGATIVE
Ketones, ur: NEGATIVE mg/dL
Leukocytes, UA: NEGATIVE
NITRITE: NEGATIVE
PROTEIN: NEGATIVE mg/dL
Specific Gravity, Urine: 1.027 (ref 1.005–1.030)
pH: 6 (ref 5.0–8.0)

## 2016-10-03 NOTE — ED Triage Notes (Signed)
Pt endorses headache, chills and dry cough and weakness x 2 days. Pt states "i just want to make sure I don't have pneumonia because I had it before" Afebrile in triage.

## 2016-10-04 ENCOUNTER — Encounter (HOSPITAL_COMMUNITY): Payer: Self-pay | Admitting: Nurse Practitioner

## 2016-10-04 ENCOUNTER — Ambulatory Visit (HOSPITAL_COMMUNITY): Payer: BLUE CROSS/BLUE SHIELD

## 2016-10-04 ENCOUNTER — Emergency Department (HOSPITAL_COMMUNITY)
Admission: EM | Admit: 2016-10-04 | Discharge: 2016-10-04 | Disposition: A | Discharge status: Home / Self Care | Payer: BLUE CROSS/BLUE SHIELD | Attending: Emergency Medicine | Admitting: Emergency Medicine

## 2016-10-04 ENCOUNTER — Ambulatory Visit (HOSPITAL_COMMUNITY): Admission: RE | Admit: 2016-10-04 | Payer: BLUE CROSS/BLUE SHIELD | Source: Ambulatory Visit

## 2016-10-04 DIAGNOSIS — Z7901 Long term (current) use of anticoagulants: Secondary | ICD-10-CM | POA: Insufficient documentation

## 2016-10-04 DIAGNOSIS — M79605 Pain in left leg: Secondary | ICD-10-CM

## 2016-10-04 DIAGNOSIS — R51 Headache: Secondary | ICD-10-CM

## 2016-10-04 DIAGNOSIS — Z87891 Personal history of nicotine dependence: Secondary | ICD-10-CM | POA: Insufficient documentation

## 2016-10-04 DIAGNOSIS — R519 Headache, unspecified: Secondary | ICD-10-CM

## 2016-10-04 DIAGNOSIS — Z86718 Personal history of other venous thrombosis and embolism: Secondary | ICD-10-CM | POA: Insufficient documentation

## 2016-10-04 LAB — CBC WITH DIFFERENTIAL/PLATELET
BASOS ABS: 0 10*3/uL (ref 0.0–0.1)
BASOS PCT: 0 %
EOS PCT: 3 %
Eosinophils Absolute: 0.3 10*3/uL (ref 0.0–0.7)
HCT: 42.8 % (ref 39.0–52.0)
Hemoglobin: 15.1 g/dL (ref 13.0–17.0)
Lymphocytes Relative: 16 %
Lymphs Abs: 1.9 10*3/uL (ref 0.7–4.0)
MCH: 32.3 pg (ref 26.0–34.0)
MCHC: 35.3 g/dL (ref 30.0–36.0)
MCV: 91.5 fL (ref 78.0–100.0)
MONO ABS: 1.5 10*3/uL — AB (ref 0.1–1.0)
Monocytes Relative: 12 %
Neutro Abs: 8.6 10*3/uL — ABNORMAL HIGH (ref 1.7–7.7)
Neutrophils Relative %: 69 %
PLATELETS: 260 10*3/uL (ref 150–400)
RBC: 4.68 MIL/uL (ref 4.22–5.81)
RDW: 13.2 % (ref 11.5–15.5)
WBC: 12.4 10*3/uL — ABNORMAL HIGH (ref 4.0–10.5)

## 2016-10-04 LAB — COMPREHENSIVE METABOLIC PANEL
ALBUMIN: 3.8 g/dL (ref 3.5–5.0)
ALK PHOS: 87 U/L (ref 38–126)
ALT: 29 U/L (ref 17–63)
ANION GAP: 11 (ref 5–15)
AST: 28 U/L (ref 15–41)
BILIRUBIN TOTAL: 0.9 mg/dL (ref 0.3–1.2)
BUN: 8 mg/dL (ref 6–20)
CALCIUM: 9.1 mg/dL (ref 8.9–10.3)
CO2: 21 mmol/L — ABNORMAL LOW (ref 22–32)
Chloride: 102 mmol/L (ref 101–111)
Creatinine, Ser: 0.93 mg/dL (ref 0.61–1.24)
GFR calc Af Amer: >60 mL/min (ref >60)
GFR calc non Af Amer: >60 mL/min (ref >60)
GLUCOSE: 117 mg/dL — AB (ref 65–99)
Potassium: 3.8 mmol/L (ref 3.5–5.1)
Sodium: 134 mmol/L — ABNORMAL LOW (ref 135–145)
Total Protein: 7.5 g/dL (ref 6.5–8.1)

## 2016-10-04 LAB — I-STAT CG4 LACTIC ACID, ED: LACTIC ACID, VENOUS: 1.27 mmol/L (ref 0.5–1.9)

## 2016-10-04 MED ORDER — ACETAMINOPHEN 325 MG PO TABS
650.0000 mg | ORAL_TABLET | Freq: Once | ORAL | Status: AC
  Administered 2016-10-04: 650 mg via ORAL

## 2016-10-04 MED ORDER — ENOXAPARIN SODIUM 120 MG/0.8ML ~~LOC~~ SOLN
1.0000 mg/kg | SUBCUTANEOUS | Status: AC
  Administered 2016-10-04: 115 mg via SUBCUTANEOUS
  Filled 2016-10-04: qty 0.8

## 2016-10-04 MED ORDER — ACETAMINOPHEN 325 MG PO TABS
ORAL_TABLET | ORAL | Status: AC
  Filled 2016-10-04: qty 2

## 2016-10-04 MED ORDER — ACETAMINOPHEN 500 MG PO TABS
ORAL_TABLET | ORAL | Status: AC
  Filled 2016-10-04: qty 2

## 2016-10-04 NOTE — ED Triage Notes (Addendum)
Pt presents with c/o leg pain and headache. The leg pain began last night. He was seen here for the pain and scheduled for outpatient doppler today but could not afford the test and could not get off work for the test. He developed the headache after working outside all day today and is concerned for dehydration. He denies any nausea, vomiting, blurred vision.

## 2016-10-04 NOTE — ED Provider Notes (Signed)
[facility]-EMERG[facility]Y DEPT Provider Note   CSN: [id number] Arrival date & time: 10/03/16  2221  By signing my name below, I, [name] [name], attest that this documentation has been prepared under the direction and in the presence of Derwood Kaplan, MD. Electronically Signed: Karren Cobble, ED Scribe. 10/04/16. 2:59 AM.  History   Chief Complaint Chief Complaint  Patient presents with  . Headache  . Cough   The history is provided by the patient. No language interpreter was used.    HPI Comments: [name] [name] is a 36 y.o. male with a history of DVT, GERD, MI, and pneumothorax who presents to the Emergency Department complaining of gradually worsening, acute on chronic left leg tightness and swelling. Pt notes associated headache, fatigue, and chills. He reports a hx of BLE edema and tightness but reports the pain in his left leg is worse than his right at this time. He describes his headache as pressure-like and it radiates from the front of his headache into the base of his neck that began today. Reports hx of intermittent BLE edema and he wears sleeves to manage the swelling.  He reports he has been taken Xarelto, but his dosages are off due to his work schedule.  Denies chills, numbness, tingling, weakness, visual disturbance, dizziness, or light-headedness.  Past Medical History:  Diagnosis Date  . Anxiety and depression   . DVT (deep venous thrombosis) (HCC)    Pt unsure of date   . Factor V Leiden mutation (HCC)    With resultant hypercoaguable state, Hx of DVT, PE, SP IVC filter placement, on chronic coumadin and requires high doses to maintine IRN 2-3.  Marland Kitchen GERD (gastroesophageal reflux disease)    history of  . Myocardial infarction (HCC)   . PE (pulmonary thromboembolism) (HCC)    unsure of date  . Pneumonia   . Pneumothorax 8/11   HX of, requiring chest tube/hospitalization.   . Venous stasis    bilateral LE.   Patient Active Problem List   Diagnosis Date Noted  . DVT  (deep venous thrombosis) (HCC)   . PE (pulmonary thromboembolism) (HCC)   . Long term current use of anticoagulant 05/09/2010  . Dysthymic disorder 11/24/2009  . ULCER OF OTHER PART OF LOWER LIMB 10/28/2008  . PRIMARY HYPERCOAGULABLE STATE 12/18/2007  . DVT 12/13/2007   Past Surgical History:  Procedure Laterality Date  . ANGIOPLASTY ILLIAC ARTERY Right 07/11/2016   Procedure: balloon ANGIOPLASTY of Inferior vena cava, right external iliac vein, right common femoral vein;  Surgeon: Maeola Harman, MD;  [facility];  Service: Vascular;  Laterality: Right;  . INTRAVASCULAR ULTRASOUND/IVUS Right 07/11/2016   Procedure: INTRAVASCULAR ULTRASOUND/IVUS of right popliteal vein, right common femoral vein, right femoral vein, right exterternal iliac vein, and right internal iliac vein;  Surgeon: Maeola Harman, MD;  Location: [facility] OR;  Service: Vascular;  Laterality: Right;  . LOWER EXTREMITY ANGIOGRAPHY N/A 07/12/2016   Procedure: LYSIS RECHECK;  Surgeon: Nada Libman, MD;  Location: [facility] IN[facility];  Service: Cardiovascular;  Laterality: N/A;  . placement of IVC filter  8/09  . Placement of large-bore right chest tube  8/11  . right minithoracotomy with evacuation of hematoma  8/09  . ULTRASOUND GUIDANCE FOR VASCULAR ACCESS Right 07/11/2016   Procedure: ULTRASOUND GUIDANCE FOR VASCULAR ACCESS;  Surgeon: Maeola Harman, MD;  Location: [facility] OR;  Service: Vascular;  Laterality: Right;    Home Medications    Prior to Admission medications   Medication  Sig Start Date End Date Taking? Authorizing Provider  rivaroxaban (XARELTO) 20 MG TABS tablet Take 1 tablet (20 mg total) by mouth daily with supper. 07/13/16  Yes Rhyne, Ames Coupe, PA-C  acetaminophen (TYLENOL) 500 MG tablet Take 1 tablet (500 mg total) by mouth every 6 (six) hours as needed. Patient not taking: Reported on 12/03/2015 12/03/15   Cheri Fowler, PA-C  amoxicillin-clavulanate (AUGMENTIN) 875-125 MG tablet  Take 1 tablet by mouth every 12 (twelve) hours. Patient not taking: Reported on 10/04/2016 07/08/16   Garlon Hatchet, PA-C  HYDROcodone-acetaminophen (NORCO/VICODIN) 5-325 MG tablet Take 2 tablets by mouth every 4 (four) hours as needed. Patient not taking: Reported on 07/08/2016 12/03/15   Roxy Horseman, PA-C  oxyCODONE (OXY IR/ROXICODONE) 5 MG immediate release tablet Take 1 tablet (5 mg total) by mouth every 6 (six) hours as needed for moderate pain. Patient not taking: Reported on 10/04/2016 07/13/16   Dara Lords, PA-C  Rivaroxaban 15 & 20 MG TBPK Take as directed on package: Start with one 15mg  tablet by mouth twice a day with food. On Day 22, switch to one 20mg  tablet once a day with food. Patient not taking: Reported on 10/04/2016 07/08/16   Garlon Hatchet, PA-C   Family History History reviewed. No pertinent family history.  Social History Social History  Substance Use Topics  . Smoking status: Former Smoker    Packs/day: 1.00    Years: 20.00    Types: Cigarettes    Quit date: 07/07/2016  . Smokeless tobacco: Former Neurosurgeon     Comment: Started smokin at age 15.   Marland Kitchen Alcohol use No   Allergies   No known allergies  Review of Systems Review of Systems  Eyes: Negative for visual disturbance.  Cardiovascular: Positive for leg swelling.  Neurological: Positive for headaches. Negative for dizziness, weakness, light-headedness and numbness.   Physical Exam Updated Vital Signs BP (!) 150/78   Pulse 82   Temp 98.4 F (36.9 C) (Oral)   Resp 16   Ht 6' (1.829 m)   Wt 113.4 kg (250 lb)   SpO2 100%   BMI 33.91 kg/m   Physical Exam  Constitutional: He is oriented to person, place, and time. He appears well-developed and well-nourished.  HENT:  Head: Normocephalic.  Eyes: EOM are normal. Pupils are equal, round, and reactive to light. Right eye exhibits no nystagmus. Left eye exhibits no nystagmus.  3 mm.   Neck: Normal range of motion.  Cardiovascular:  1+ DP pulses  bilaterally.   Pulmonary/Chest: Effort normal.  Lungs are clear to auscultation.  Abdominal: He exhibits no distension.  Musculoskeletal: Normal range of motion. He exhibits edema and tenderness.  Palpable chord on left lower extremity with unilateral swelling.  Neurological: He is alert and oriented to person, place, and time.  Cranial nerve 2-12 intact. Cerebellum exam reveals no dysmetria. Upper and lower extremity gross motor and sensory intact.  Psychiatric: He has a normal mood and affect.  Nursing note and vitals reviewed.  ED Treatments / Results  DIAGNOSTIC STUDIES: Oxygen Saturation is 96% on RA, adqueatte by my interpretation.   COORDINATION OF CARE: 2:32 AM-Discussed next steps with pt. Pt verbalized understanding and is agreeable with the plan.   Labs (all labs ordered are listed, but only abnormal results are displayed) Labs Reviewed  COMPREHENSIVE METABOLIC PANEL - Abnormal; Notable for the following:       Result Value   Potassium 3.4 (*)    Glucose, Bld 113 (*)  All other components within normal limits  CBC WITH DIFFERENTIAL/PLATELET - Abnormal; Notable for the following:    WBC 11.9 (*)    Neutro Abs 8.0 (*)    Monocytes Absolute 1.2 (*)    All other components within normal limits  URINALYSIS, ROUTINE W REFLEX MICROSCOPIC    EKG  EKG Interpretation None       Radiology Dg Chest 2 View  Result Date: 10/03/2016 CLINICAL DATA:  Acute onset of cough. Upper back pain. Initial encounter. EXAM: CHEST  2 VIEW COMPARISON:  Chest radiograph performed 08/22/2012 FINDINGS: The lungs are well-aerated and clear. There is no evidence of focal opacification, pleural effusion or pneumothorax. The heart is normal in size; the mediastinal contour is within normal limits. No acute osseous abnormalities are seen. IMPRESSION: No acute cardiopulmonary process seen. Electronically Signed   By: Roanna RaiderJeffery  Chang M.D.   On: 10/03/2016 23:06   Procedures Procedures (including  critical care time)  Medications Ordered in ED Medications  enoxaparin (LOVENOX) injection 115 mg (not administered)    Initial Impression / Assessment and Plan / ED Course  I have reviewed the triage vital signs and the nursing notes.  Pertinent labs & imaging results that were available during my care of the patient were reviewed by me and considered in my medical decision making (see chart for details).    Pt comes in with cc of leg pain. Hx of factor V Leiden and recurrent DVTs. PT on xarelto, he is s/p ivc placement. We will order DVT study. There is no evidence of infection.  Pt also has cough. No chest pain, dib. No hemoptysis.  Risk and factors for PE discussed for PE -and pt agrees that he will return to the ER if the symptoms get worse. VSS and WNL and no chest pain or dib currently.  Finally, pt also has headache and photophobia. Headache resolved. Neuro exam non focal. I considered venous thrombosis in the ddx. Pt however reported that he has no headaches anymore, and his main complain is leg, and refused CT head. He would prefer to come in is his symptoms get worse.  Neuro exam is non focal.  Strict ER return precautions have been discussed, and patient is agreeing with the plan and is comfortable with the workup done and the recommendations from the ER.   Final Clinical Impressions(s) / ED Diagnoses   Final diagnoses:  Bad headache  Leg pain, anterior, left   New Prescriptions New Prescriptions   No medications on file   I personally performed the services described in this documentation, which was scribed in my presence. The recorded information has been reviewed and is accurate.     Derwood KaplanNanavati, Bryella Diviney, MD 10/04/16 [phone]

## 2016-10-04 NOTE — Discharge Instructions (Signed)
You will get blood clot study in the leg tomorrow.  Please return to the ER if the headache gets severe and in not improving, you have associated new one sided numbness, tingling, weakness or confusion, seizures, poor balance or poor vision.  Please return to the ER if you start having shortness of breath, severe chest pain, chest pain that is worse with breathing or blood in the phlegm.

## 2016-10-05 ENCOUNTER — Emergency Department (HOSPITAL_COMMUNITY)
Admission: EM | Admit: 2016-10-05 | Discharge: 2016-10-05 | Disposition: A | Discharge status: Home / Self Care | Payer: BLUE CROSS/BLUE SHIELD | Source: Home / Self Care | Attending: Emergency Medicine | Admitting: Emergency Medicine

## 2016-10-05 ENCOUNTER — Ambulatory Visit (HOSPITAL_BASED_OUTPATIENT_CLINIC_OR_DEPARTMENT_OTHER)
Admission: RE | Admit: 2016-10-05 | Discharge: 2016-10-05 | Disposition: A | Discharge status: Home / Self Care | Payer: BLUE CROSS/BLUE SHIELD | Source: Ambulatory Visit | Attending: Emergency Medicine | Admitting: Emergency Medicine

## 2016-10-05 DIAGNOSIS — M79605 Pain in left leg: Secondary | ICD-10-CM | POA: Insufficient documentation

## 2016-10-05 DIAGNOSIS — I82812 Embolism and thrombosis of superficial veins of left lower extremities: Secondary | ICD-10-CM

## 2016-10-05 DIAGNOSIS — R590 Localized enlarged lymph nodes: Secondary | ICD-10-CM | POA: Insufficient documentation

## 2016-10-05 DIAGNOSIS — Z86718 Personal history of other venous thrombosis and embolism: Secondary | ICD-10-CM

## 2016-10-05 DIAGNOSIS — M7989 Other specified soft tissue disorders: Secondary | ICD-10-CM

## 2016-10-05 DIAGNOSIS — M79609 Pain in unspecified limb: Secondary | ICD-10-CM

## 2016-10-05 MED ORDER — OXYCODONE-ACETAMINOPHEN 5-325 MG PO TABS
1.0000 | ORAL_TABLET | Freq: Once | ORAL | Status: AC
  Administered 2016-10-05: 1 via ORAL
  Filled 2016-10-05: qty 1

## 2016-10-05 MED ORDER — ENOXAPARIN SODIUM 120 MG/0.8ML ~~LOC~~ SOLN
1.0000 mg/kg | Freq: Once | SUBCUTANEOUS | Status: AC
  Administered 2016-10-05: 110 mg via SUBCUTANEOUS
  Filled 2016-10-05: qty 0.8

## 2016-10-05 NOTE — Discharge Instructions (Signed)
You were seen today with concerns for DVT. It is very important that he return later this morning for an ultrasound test. Ultrasound imaging can only be done during the daytime hours. You were given additional dose of Lovenox. Make sure to take your Xarelto as directed.

## 2016-10-05 NOTE — ED Provider Notes (Signed)
[facility] DEPT Provider Note   CSN: [id number] Arrival date & time: 10/04/16  1844  By signing my name below, I, [name] [name], attest that this documentation has been prepared under the direction and in the presence of Horton, Mayer Masker, MD. Electronically Signed: Karren Cobble, ED Scribe. 10/05/16. 2:05 AM.  History   Chief Complaint Chief Complaint  Patient presents with  . Leg Pain  . Headache   The history is provided by the patient. No language interpreter was used.    HPI Comments: [name] [name] is a 36 y.o. male with a PMHx of DVT, GERD. MI, and pneumothorax who presents to the Emergency Department complaining of chronic left leg swelling. Pt was seen in the ED with the same complaint last night and notes he returned for an ultrasound. He was scheduled for an ultrasound yesterday at [facility] but reports was unable to attend due to time and financial constraints. He reports he has been taking Xarelto, but his dosages are off due to his work schedule. Last dosage was taken at 9:30 pm, last night. Pt reports he was previously followed by [facility], but no other is a pt. Denies headache, chest pain, shortness of breath, fever, or chills. Initially he also reported headache to nursing but denies to me.  Past Medical History:  Diagnosis Date  . Anxiety and depression   . DVT (deep venous thrombosis) (HCC)    Pt unsure of date   . Factor V Leiden mutation (HCC)    With resultant hypercoaguable state, Hx of DVT, PE, SP IVC filter placement, on chronic coumadin and requires high doses to maintine IRN 2-3.  Marland Kitchen GERD (gastroesophageal reflux disease)    history of  . Myocardial infarction (HCC)   . PE (pulmonary thromboembolism) (HCC)    unsure of date  . Pneumonia   . Pneumothorax 8/11   HX of, requiring chest tube/hospitalization.   . Venous stasis    bilateral LE.   Patient Active Problem List   Diagnosis Date Noted  . DVT (deep venous thrombosis) (HCC)   . PE  (pulmonary thromboembolism) (HCC)   . Long term current use of anticoagulant 05/09/2010  . Dysthymic disorder 11/24/2009  . ULCER OF OTHER PART OF LOWER LIMB 10/28/2008  . PRIMARY HYPERCOAGULABLE STATE 12/18/2007  . DVT 12/13/2007   Past Surgical History:  Procedure Laterality Date  . ANGIOPLASTY ILLIAC ARTERY Right 07/11/2016   Procedure: balloon ANGIOPLASTY of Inferior vena cava, right external iliac vein, right common femoral vein;  Surgeon: Maeola Harman, MD;  Location: [facility] OR;  Service: Vascular;  Laterality: Right;  . INTRAVASCULAR ULTRASOUND/IVUS Right 07/11/2016   Procedure: INTRAVASCULAR ULTRASOUND/IVUS of right popliteal vein, right common femoral vein, right femoral vein, right exterternal iliac vein, and right internal iliac vein;  Surgeon: Maeola Harman, MD;  Location: [facility] OR;  Service: Vascular;  Laterality: Right;  . LOWER EXTREMITY ANGIOGRAPHY N/A 07/12/2016   Procedure: LYSIS RECHECK;  Surgeon: Nada Libman, MD;  [facility] IN[facility];  Service: Cardiovascular;  Laterality: N/A;  . placement of IVC filter  8/09  . Placement of large-bore right chest tube  8/11  . right minithoracotomy with evacuation of hematoma  8/09  . ULTRASOUND GUIDANCE FOR VASCULAR ACCESS Right 07/11/2016   Procedure: ULTRASOUND GUIDANCE FOR VASCULAR ACCESS;  Surgeon: Maeola Harman, MD;  Location: [facility] OR;  Service: Vascular;  Laterality: Right;    Home Medications    Prior to Admission medications   Medication  Sig Start Date End Date Taking? Authorizing Provider  rivaroxaban (XARELTO) 20 MG TABS tablet Take 1 tablet (20 mg total) by mouth daily with supper. 07/13/16  Yes Rhyne, Ames Coupe, PA-C  acetaminophen (TYLENOL) 500 MG tablet Take 1 tablet (500 mg total) by mouth every 6 (six) hours as needed. Patient not taking: Reported on 12/03/2015 12/03/15   Cheri Fowler, PA-C  amoxicillin-clavulanate (AUGMENTIN) 875-125 MG tablet Take 1 tablet by mouth every 12 (twelve)  hours. Patient not taking: Reported on 10/04/2016 07/08/16   Garlon Hatchet, PA-C  HYDROcodone-acetaminophen (NORCO/VICODIN) 5-325 MG tablet Take 2 tablets by mouth every 4 (four) hours as needed. Patient not taking: Reported on 07/08/2016 12/03/15   Roxy Horseman, PA-C  oxyCODONE (OXY IR/ROXICODONE) 5 MG immediate release tablet Take 1 tablet (5 mg total) by mouth every 6 (six) hours as needed for moderate pain. Patient not taking: Reported on 10/04/2016 07/13/16   Dara Lords, PA-C  Rivaroxaban 15 & 20 MG TBPK Take as directed on package: Start with one 15mg  tablet by mouth twice a day with food. On Day 22, switch to one 20mg  tablet once a day with food. Patient not taking: Reported on 10/04/2016 07/08/16   Garlon Hatchet, PA-C   Family History History reviewed. No pertinent family history.  Social History Social History  Substance Use Topics  . Smoking status: Former Smoker    Packs/day: 1.00    Years: 20.00    Types: Cigarettes    Quit date: 07/07/2016  . Smokeless tobacco: Former Neurosurgeon     Comment: Started smokin at age 26.   Marland Kitchen Alcohol use No   Allergies   No known allergies  Review of Systems Review of Systems  Constitutional: Negative for chills and fever.  Respiratory: Negative for shortness of breath.   Cardiovascular: Positive for leg swelling. Negative for chest pain.  Musculoskeletal:       Left leg pain.  Neurological: Negative for headaches.  All other systems reviewed and are negative.   Physical Exam Updated Vital Signs BP 129/79   Pulse 86   Temp 98.8 F (37.1 C) (Oral)   Resp 17   Ht 6' (1.829 m)   Wt 111.1 kg (245 lb)   SpO2 97%   BMI 33.23 kg/m   Physical Exam  Constitutional: He is oriented to person, place, and time. He appears well-developed and well-nourished.  HENT:  Head: Normocephalic and atraumatic.  Cardiovascular: Normal rate and regular rhythm.   Pulmonary/Chest: Effort normal. No respiratory distress.  Musculoskeletal: He  exhibits no edema.  Tenderness palpation left calf, mild asymmetric swelling  Neurological: He is alert and oriented to person, place, and time.  Skin: Skin is warm and dry.  Chronic venous stasis changes of the lower extremity  Psychiatric: He has a normal mood and affect.  Nursing note and vitals reviewed.   ED Treatments / Results  DIAGNOSTIC STUDIES: Oxygen Saturation is 97% on RA, adqueate by my interpretation.   COORDINATION OF CARE: 1:47 AM-Discussed next steps with pt. Pt verbalized understanding and is agreeable with the plan.   Labs (all labs ordered are listed, but only abnormal results are displayed) Labs Reviewed  COMPREHENSIVE METABOLIC PANEL - Abnormal; Notable for the following:       Result Value   Sodium 134 (*)    CO2 21 (*)    Glucose, Bld 117 (*)    All other components within normal limits  CBC WITH DIFFERENTIAL/PLATELET - Abnormal; Notable for the following:  WBC 12.4 (*)    Neutro Abs 8.6 (*)    Monocytes Absolute 1.5 (*)    All other components within normal limits  I-STAT CG4 LACTIC ACID, ED  I-STAT CG4 LACTIC ACID, ED   EKG  EKG Interpretation None      Radiology Dg Chest 2 View  Result Date: 10/03/2016 CLINICAL DATA:  Acute onset of cough. Upper back pain. Initial encounter. EXAM: CHEST  2 VIEW COMPARISON:  Chest radiograph performed 08/22/2012 FINDINGS: The lungs are well-aerated and clear. There is no evidence of focal opacification, pleural effusion or pneumothorax. The heart is normal in size; the mediastinal contour is within normal limits. No acute osseous abnormalities are seen. IMPRESSION: No acute cardiopulmonary process seen. Electronically Signed   By: Roanna RaiderJeffery  Chang M.D.   On: 10/03/2016 23:06    Procedures Procedures (including critical care time)  Medications Ordered in ED Medications  acetaminophen (TYLENOL) 325 MG tablet (not administered)  enoxaparin (LOVENOX) injection 110 mg (not administered)    oxyCODONE-acetaminophen (PERCOCET/ROXICET) 5-325 MG per tablet 1 tablet (not administered)  acetaminophen (TYLENOL) tablet 650 mg (650 mg Oral Given 10/04/16 1916)   Initial Impression / Assessment and Plan / ED Course  I have reviewed the triage vital signs and the nursing notes.  Pertinent labs & imaging results that were available during my care of the patient were reviewed by me and considered in my medical decision making (see chart for details).     Patient presents requesting ultrasound. Has previously been not compliant with his Xarelto. History of DVT and IVC filter. He received a dose of Lovenox last night. He has no additional complaints for me. I discussed with the patient that we do not do ultrasounds at night and he would need to return during daytime hours for the ultrasound. He is concerned that his Xarelto is not working. He was dosed 1 additional dose of Lovenox and repeat ultrasound was ordered.  After history, exam, and medical workup I feel the patient has been appropriately medically screened and is safe for discharge home. Pertinent diagnoses were discussed with the patient. Patient was given return precautions.   Final Clinical Impressions(s) / ED Diagnoses   Final diagnoses:  Left leg pain  History of DVT (deep vein thrombosis)   New Prescriptions New Prescriptions   No medications on file   I personally performed the services described in this documentation, which was scribed in my presence. The recorded information has been reviewed and is accurate.     Shon BatonHorton, Courtney F, MD 10/05/16 [phone]

## 2016-10-05 NOTE — Progress Notes (Signed)
*  PRELIMINARY RESULTS* Vascular Ultrasound Left lower extremity venous duplex has been completed.  Preliminary findings: No evidence of deep vein thrombosis in the left lower extremity. Findings suggestive of acute superficial vein thrombosis in the varicose veins of the left posterior ankle.  Preliminary results called to Dr. Rubin PayorPickering in the ED, advised that the patient is okay to leave and should continue with therapy as previously recommended.    [name] FischerCharlotte C Nelson [name] 10/05/2016, 8:50 AM

## 2016-10-06 ENCOUNTER — Emergency Department (HOSPITAL_COMMUNITY): Payer: BLUE CROSS/BLUE SHIELD

## 2016-10-06 ENCOUNTER — Encounter (HOSPITAL_COMMUNITY): Payer: Self-pay

## 2016-10-06 ENCOUNTER — Emergency Department (HOSPITAL_COMMUNITY)
Admission: EM | Admit: 2016-10-06 | Discharge: 2016-10-06 | Disposition: A | Discharge status: Home / Self Care | Payer: BLUE CROSS/BLUE SHIELD | Attending: Emergency Medicine | Admitting: Emergency Medicine

## 2016-10-06 DIAGNOSIS — B349 Viral infection, unspecified: Secondary | ICD-10-CM

## 2016-10-06 DIAGNOSIS — R519 Headache, unspecified: Secondary | ICD-10-CM

## 2016-10-06 DIAGNOSIS — I252 Old myocardial infarction: Secondary | ICD-10-CM | POA: Diagnosis not present

## 2016-10-06 DIAGNOSIS — Z79899 Other long term (current) drug therapy: Secondary | ICD-10-CM | POA: Insufficient documentation

## 2016-10-06 DIAGNOSIS — R6889 Other general symptoms and signs: Secondary | ICD-10-CM

## 2016-10-06 DIAGNOSIS — R05 Cough: Secondary | ICD-10-CM

## 2016-10-06 DIAGNOSIS — Z87891 Personal history of nicotine dependence: Secondary | ICD-10-CM | POA: Diagnosis not present

## 2016-10-06 DIAGNOSIS — Z7901 Long term (current) use of anticoagulants: Secondary | ICD-10-CM | POA: Insufficient documentation

## 2016-10-06 DIAGNOSIS — R51 Headache: Secondary | ICD-10-CM | POA: Insufficient documentation

## 2016-10-06 DIAGNOSIS — R059 Cough, unspecified: Secondary | ICD-10-CM

## 2016-10-06 DIAGNOSIS — R509 Fever, unspecified: Secondary | ICD-10-CM | POA: Diagnosis present

## 2016-10-06 LAB — CBC WITH DIFFERENTIAL/PLATELET
BASOS ABS: 0 10*3/uL (ref 0.0–0.1)
Basophils Relative: 0 %
Eosinophils Absolute: 0.1 10*3/uL (ref 0.0–0.7)
Eosinophils Relative: 1 %
HEMATOCRIT: 42.5 % (ref 39.0–52.0)
Hemoglobin: 14.6 g/dL (ref 13.0–17.0)
LYMPHS ABS: 1.3 10*3/uL (ref 0.7–4.0)
LYMPHS PCT: 14 %
MCH: 31.7 pg (ref 26.0–34.0)
MCHC: 34.4 g/dL (ref 30.0–36.0)
MCV: 92.2 fL (ref 78.0–100.0)
MONO ABS: 0.8 10*3/uL (ref 0.1–1.0)
MONOS PCT: 9 %
NEUTROS ABS: 7.1 10*3/uL (ref 1.7–7.7)
Neutrophils Relative %: 76 %
Platelets: 215 10*3/uL (ref 150–400)
RBC: 4.61 MIL/uL (ref 4.22–5.81)
RDW: 13.2 % (ref 11.5–15.5)
WBC: 9.3 10*3/uL (ref 4.0–10.5)

## 2016-10-06 LAB — BASIC METABOLIC PANEL
ANION GAP: 11 (ref 5–15)
BUN: 7 mg/dL (ref 6–20)
CALCIUM: 9.1 mg/dL (ref 8.9–10.3)
CO2: 21 mmol/L — AB (ref 22–32)
Chloride: 99 mmol/L — ABNORMAL LOW (ref 101–111)
Creatinine, Ser: 0.86 mg/dL (ref 0.61–1.24)
GFR calc Af Amer: >60 mL/min (ref >60)
GFR calc non Af Amer: >60 mL/min (ref >60)
GLUCOSE: 112 mg/dL — AB (ref 65–99)
Potassium: 3.6 mmol/L (ref 3.5–5.1)
Sodium: 131 mmol/L — ABNORMAL LOW (ref 135–145)

## 2016-10-06 MED ORDER — METOCLOPRAMIDE HCL 5 MG/ML IJ SOLN
10.0000 mg | Freq: Once | INTRAMUSCULAR | Status: AC
  Administered 2016-10-06: 10 mg via INTRAVENOUS
  Filled 2016-10-06: qty 2

## 2016-10-06 MED ORDER — ACETAMINOPHEN 500 MG PO TABS
1000.0000 mg | ORAL_TABLET | Freq: Once | ORAL | Status: AC
  Administered 2016-10-06: 1000 mg via ORAL
  Filled 2016-10-06: qty 2

## 2016-10-06 MED ORDER — SODIUM CHLORIDE 0.9 % IV BOLUS (SEPSIS)
1000.0000 mL | Freq: Once | INTRAVENOUS | Status: AC
  Administered 2016-10-06: 1000 mL via INTRAVENOUS

## 2016-10-06 MED ORDER — KETOROLAC TROMETHAMINE 15 MG/ML IJ SOLN
15.0000 mg | Freq: Once | INTRAMUSCULAR | Status: AC
  Administered 2016-10-06: 15 mg via INTRAVENOUS
  Filled 2016-10-06: qty 1

## 2016-10-06 NOTE — ED Notes (Signed)
Pt is in stable condition upon d/c and ambulates from ED. 

## 2016-10-06 NOTE — ED Notes (Signed)
No tylenol given in triage because pt states he took around 12pm, which is within 4-6 hours of this time.

## 2016-10-06 NOTE — ED Provider Notes (Signed)
[facility]-EMERGENCY DEPT Provider Note   CSN: [id number] Arrival date & time: 10/06/16  1640     History   Chief Complaint Chief Complaint  Patient presents with  . Generalized Body Aches    HPI [name] [name] is a 36 y.o. male.  The history is provided by the patient.  Fever   This is a new problem. Episode onset: 3 days. Episode frequency: intermittent. Progression since onset: fluctuating. His temperature was unmeasured prior to arrival. Associated symptoms include headaches, muscle aches and cough. Pertinent negatives include no chest pain, no diarrhea, no vomiting, no congestion and no sore throat. He has tried acetaminophen for the symptoms. The treatment provided mild relief.    Past Medical History:  Diagnosis Date  . Anxiety and depression   . DVT (deep venous thrombosis) (HCC)    Pt unsure of date   . Factor V Leiden mutation (HCC)    With resultant hypercoaguable state, Hx of DVT, PE, SP IVC filter placement, on chronic coumadin and requires high doses to maintine IRN 2-3.  Marland Kitchen GERD (gastroesophageal reflux disease)    history of  . Myocardial infarction (HCC)   . PE (pulmonary thromboembolism) (HCC)    unsure of date  . Pneumonia   . Pneumothorax 8/11   HX of, requiring chest tube/hospitalization.   . Venous stasis    bilateral LE.    Patient Active Problem List   Diagnosis Date Noted  . DVT (deep venous thrombosis) (HCC)   . PE (pulmonary thromboembolism) (HCC)   . Long term current use of anticoagulant 05/09/2010  . Dysthymic disorder 11/24/2009  . ULCER OF OTHER PART OF LOWER LIMB 10/28/2008  . PRIMARY HYPERCOAGULABLE STATE 12/18/2007  . DVT 12/13/2007    Past Surgical History:  Procedure Laterality Date  . ANGIOPLASTY ILLIAC ARTERY Right 07/11/2016   Procedure: balloon ANGIOPLASTY of Inferior vena cava, right external iliac vein, right common femoral vein;  Surgeon: Maeola Harman, MD;  Location: [facility] OR;  Service: Vascular;  Laterality: Right;   . INTRAVASCULAR ULTRASOUND/IVUS Right 07/11/2016   Procedure: INTRAVASCULAR ULTRASOUND/IVUS of right popliteal vein, right common femoral vein, right femoral vein, right exterternal iliac vein, and right internal iliac vein;  Surgeon: Maeola Harman, MD;  Location: [facility] OR;  Service: Vascular;  Laterality: Right;  . LOWER EXTREMITY ANGIOGRAPHY N/A 07/12/2016   Procedure: LYSIS RECHECK;  Surgeon: Nada Libman, MD;  Location: [facility] INVASIVE CV LAB;  Service: Cardiovascular;  Laterality: N/A;  . placement of IVC filter  8/09  . Placement of large-bore right chest tube  8/11  . right minithoracotomy with evacuation of hematoma  8/09  . ULTRASOUND GUIDANCE FOR VASCULAR ACCESS Right 07/11/2016   Procedure: ULTRASOUND GUIDANCE FOR VASCULAR ACCESS;  Surgeon: Maeola Harman, MD;  Location: [facility] OR;  Service: Vascular;  Laterality: Right;       Home Medications    Prior to Admission medications   Medication Sig Start Date End Date Taking? Authorizing Provider  acetaminophen (TYLENOL) 500 MG tablet Take 1 tablet (500 mg total) by mouth every 6 (six) hours as needed. Patient not taking: Reported on 12/03/2015 12/03/15   Cheri Fowler, PA-C  amoxicillin-clavulanate (AUGMENTIN) 875-125 MG tablet Take 1 tablet by mouth every 12 (twelve) hours. Patient not taking: Reported on 10/04/2016 07/08/16   Garlon Hatchet, PA-C  HYDROcodone-acetaminophen (NORCO/VICODIN) 5-325 MG tablet Take 2 tablets by mouth every 4 (four) hours as needed. Patient not taking: Reported on 07/08/2016 12/03/15   Roxy Horseman, PA-C  oxyCODONE (OXY IR/ROXICODONE) 5 MG immediate release tablet Take 1 tablet (5 mg total) by mouth every 6 (six) hours as needed for moderate pain. Patient not taking: Reported on 10/04/2016 07/13/16   Dara Lords, PA-C  rivaroxaban (XARELTO) 20 MG TABS tablet Take 1 tablet (20 mg total) by mouth daily with supper. 07/13/16   Rhyne, Ames Coupe, PA-C  Rivaroxaban 15 & 20 MG TBPK Take as directed  on package: Start with one 15mg  tablet by mouth twice a day with food. On Day 22, switch to one 20mg  tablet once a day with food. Patient not taking: Reported on 10/04/2016 07/08/16   Garlon Hatchet, PA-C    Family History No family history on file.  Social History Social History  Substance Use Topics  . Smoking status: Former Smoker    Packs/day: 1.00    Years: 20.00    Types: Cigarettes    Quit date: 07/07/2016  . Smokeless tobacco: Former Neurosurgeon     Comment: Started smokin at age 34.   Marland Kitchen Alcohol use No     Allergies   Patient has no active allergies.   Review of Systems Review of Systems  Constitutional: Positive for chills and fever.  HENT: Negative for congestion, postnasal drip, rhinorrhea and sore throat.   Respiratory: Positive for cough. Negative for shortness of breath.   Cardiovascular: Negative for chest pain.  Gastrointestinal: Negative for abdominal pain, diarrhea and vomiting.  Genitourinary: Negative for dysuria.  Musculoskeletal: Positive for myalgias.  Neurological: Positive for headaches.     Physical Exam Updated Vital Signs BP (!) 142/92 (BP Location: Left Arm)   Pulse (!) 110   Temp (!) 102.7 F (39.3 C) (Oral)   Resp 18   SpO2 97%   Physical Exam  Constitutional: He is oriented to person, place, and time. He appears well-developed and well-nourished. No distress.  HENT:  Head: Normocephalic and atraumatic.  Right Ear: Tympanic membrane normal.  Left Ear: Tympanic membrane normal.  Nose: Nose normal.  Mouth/Throat: No posterior oropharyngeal erythema or tonsillar abscesses. No tonsillar exudate.  Eyes: Conjunctivae and EOM are normal. Pupils are equal, round, and reactive to light. Right eye exhibits no discharge. Left eye exhibits no discharge. No scleral icterus.  Neck: Normal range of motion. Neck supple. No neck rigidity. Normal range of motion present. No Brudzinski's sign and no Kernig's sign noted.  Cardiovascular: Normal rate and  regular rhythm.  Exam reveals no gallop and no friction rub.   No murmur heard. Pulmonary/Chest: Effort normal and breath sounds normal. No stridor. No respiratory distress. He has no rales.  Abdominal: Soft. He exhibits no distension. There is no tenderness.  Musculoskeletal: He exhibits no edema or tenderness.  Neurological: He is alert and oriented to person, place, and time.  Skin: Skin is warm and dry. No rash noted. He is not diaphoretic. No erythema.  Psychiatric: He has a normal mood and affect.  Vitals reviewed.    ED Treatments / Results  Labs (all labs ordered are listed, but only abnormal results are displayed) Labs Reviewed  BASIC METABOLIC PANEL - Abnormal; Notable for the following:       Result Value   Sodium 131 (*)    Chloride 99 (*)    CO2 21 (*)    Glucose, Bld 112 (*)    All other components within normal limits  CBC WITH DIFFERENTIAL/PLATELET    EKG  EKG Interpretation None       Radiology Dg Chest 2  View  Result Date: 10/06/2016 CLINICAL DATA:  Fever, nausea, chest pain. History of pneumonia, pneumothorax and pulmonary embolism. EXAM: CHEST  2 VIEW COMPARISON:  Chest radiograph October 03, 2016 FINDINGS: Cardiomediastinal silhouette is unremarkable for this low inspiratory examination with crowded vasculature markings. The lungs are clear without pleural effusions or focal consolidations. Trachea projects midline and there is no pneumothorax. Included soft tissue planes and osseous structures are non-suspicious. Mild degenerative change of thoracic spine. IMPRESSION: Stable examination:  No acute cardiopulmonary process. Electronically Signed   By: Awilda Metroourtnay  Bloomer M.D.   On: 10/06/2016 18:56    Procedures Procedures (including critical care time)  Medications Ordered in ED Medications  sodium chloride 0.9 % bolus 1,000 mL (1,000 mLs Intravenous New Bag/Given 10/06/16 1820)  acetaminophen (TYLENOL) tablet 1,000 mg (1,000 mg Oral Given 10/06/16 1820)    metoCLOPramide (REGLAN) injection 10 mg (10 mg Intravenous Given 10/06/16 1820)  ketorolac (TORADOL) 15 MG/ML injection 15 mg (15 mg Intravenous Given 10/06/16 1820)     Initial Impression / Assessment and Plan / ED Course  I have reviewed the triage vital signs and the nursing notes.  Pertinent labs & imaging results that were available during my care of the patient were reviewed by me and considered in my medical decision making (see chart for details).     Non focal neuro exam. No recent head trauma. Exam without evidence of meningismus; doubt meningitis. Doubt intracranial bleed. Doubt IIH. Doubt venous sinus thrombosis. No indication for imaging. Will treat with migraine cocktail, IV fluids, and reevaluate.   Labs grossly reassuring without evidence of leukocytosis. Chest x-ray without evidence of pneumonia.  Presentation seems to be related to a viral process.  Following treatment patient had significant improvement in his symptomatology and was able to tolerate by mouth intake.  The patient is safe for discharge with strict return precautions.  Final Clinical Impressions(s) / ED Diagnoses   Final diagnoses:  Cough  Viral illness  Flu-like symptoms  Bad headache   Disposition: Discharge  Condition: Good  I have discussed the results, Dx and Tx plan with the patient who expressed understanding and agree(s) with the plan. Discharge instructions discussed at great length. The patient was given strict return precautions who verbalized understanding of the instructions. No further questions at time of discharge.    New Prescriptions   No medications on file    Follow Up: Primary care provider  Schedule an appointment as soon as possible for a visit  in 5-7 days, If symptoms do not improve or  worsen      Cardama, Amadeo GarnetPedro Eduardo, MD 10/06/16 2025

## 2016-10-06 NOTE — ED Notes (Signed)
Pt requesting ginger ale to drink. OK per RN Valarie Coneshlesea

## 2016-10-06 NOTE — ED Notes (Signed)
Patient transported to X-ray 

## 2016-10-06 NOTE — ED Triage Notes (Signed)
Pt reports nausea, headache, body aches, chills; onset "on and off for a few days." Has not vomited but feels the need to.

## 2016-10-06 NOTE — Discharge Instructions (Signed)
You may take over-the-counter medicine for symptomatic relief, such as Tylenol, Motrin, TheraFlu, Alka seltzer , black elderberry, etc. Please limit acetaminophen (Tylenol) to 4000 mg and Ibuprofen (Motrin, Advil, etc.) to 2400 mg for a 24hr period. Please note that other over-the-counter medicine may contain acetaminophen or ibuprofen as a component of their ingredients.   

## 2016-10-07 ENCOUNTER — Encounter (HOSPITAL_COMMUNITY): Payer: Self-pay | Admitting: Emergency Medicine

## 2016-10-07 DIAGNOSIS — R509 Fever, unspecified: Secondary | ICD-10-CM | POA: Insufficient documentation

## 2016-10-07 DIAGNOSIS — R21 Rash and other nonspecific skin eruption: Secondary | ICD-10-CM | POA: Diagnosis not present

## 2016-10-07 DIAGNOSIS — Z7901 Long term (current) use of anticoagulants: Secondary | ICD-10-CM | POA: Insufficient documentation

## 2016-10-07 DIAGNOSIS — Z86711 Personal history of pulmonary embolism: Secondary | ICD-10-CM | POA: Diagnosis not present

## 2016-10-07 DIAGNOSIS — I252 Old myocardial infarction: Secondary | ICD-10-CM | POA: Insufficient documentation

## 2016-10-07 DIAGNOSIS — Z87891 Personal history of nicotine dependence: Secondary | ICD-10-CM | POA: Diagnosis not present

## 2016-10-07 NOTE — ED Triage Notes (Signed)
Pt presents to ED for assessment of continuing fever, unmanageable with tylenol and tamiflu at home.  Intermittent nausea, 1 episode of vomiing, mild diarrhea, and egenralized body aches.  Patient spoke to an RN over the phone and they recommended having his partner look at his skin for rash.  Large bulls-eye rash noted to the right upper back.  Patient works outside.

## 2016-10-07 NOTE — ED Notes (Signed)
Pa made aware of patient and rash and asked to come see in triage.

## 2016-10-08 ENCOUNTER — Emergency Department (HOSPITAL_COMMUNITY)
Admission: EM | Admit: 2016-10-08 | Discharge: 2016-10-08 | Disposition: A | Discharge status: Home / Self Care | Payer: BLUE CROSS/BLUE SHIELD | Attending: Emergency Medicine | Admitting: Emergency Medicine

## 2016-10-08 DIAGNOSIS — R509 Fever, unspecified: Secondary | ICD-10-CM

## 2016-10-08 DIAGNOSIS — R21 Rash and other nonspecific skin eruption: Secondary | ICD-10-CM

## 2016-10-08 LAB — URINALYSIS, ROUTINE W REFLEX MICROSCOPIC
Bacteria, UA: NONE SEEN
Bilirubin Urine: NEGATIVE
GLUCOSE, UA: NEGATIVE mg/dL
Ketones, ur: NEGATIVE mg/dL
Leukocytes, UA: NEGATIVE
NITRITE: NEGATIVE
PH: 5 (ref 5.0–8.0)
Protein, ur: NEGATIVE mg/dL
SPECIFIC GRAVITY, URINE: 1.009 (ref 1.005–1.030)
Squamous Epithelial / LPF: NONE SEEN

## 2016-10-08 LAB — COMPREHENSIVE METABOLIC PANEL
ALBUMIN: 3.3 g/dL — AB (ref 3.5–5.0)
ALT: 46 U/L (ref 17–63)
AST: 36 U/L (ref 15–41)
Alkaline Phosphatase: 92 U/L (ref 38–126)
Anion gap: 8 (ref 5–15)
BILIRUBIN TOTAL: 0.6 mg/dL (ref 0.3–1.2)
BUN: 8 mg/dL (ref 6–20)
CO2: 24 mmol/L (ref 22–32)
CREATININE: 0.97 mg/dL (ref 0.61–1.24)
Calcium: 9.1 mg/dL (ref 8.9–10.3)
Chloride: 98 mmol/L — ABNORMAL LOW (ref 101–111)
GFR calc Af Amer: >60 mL/min (ref >60)
GLUCOSE: 117 mg/dL — AB (ref 65–99)
Potassium: 3.2 mmol/L — ABNORMAL LOW (ref 3.5–5.1)
Sodium: 130 mmol/L — ABNORMAL LOW (ref 135–145)
TOTAL PROTEIN: 7.1 g/dL (ref 6.5–8.1)

## 2016-10-08 LAB — CBC WITH DIFFERENTIAL/PLATELET
BASOS ABS: 0 10*3/uL (ref 0.0–0.1)
BASOS PCT: 0 %
Eosinophils Absolute: 0.1 10*3/uL (ref 0.0–0.7)
Eosinophils Relative: 2 %
HCT: 41.5 % (ref 39.0–52.0)
HEMOGLOBIN: 14.2 g/dL (ref 13.0–17.0)
LYMPHS PCT: 16 %
Lymphs Abs: 1 10*3/uL (ref 0.7–4.0)
MCH: 31.2 pg (ref 26.0–34.0)
MCHC: 34.2 g/dL (ref 30.0–36.0)
MCV: 91.2 fL (ref 78.0–100.0)
MONO ABS: 1.3 10*3/uL — AB (ref 0.1–1.0)
Monocytes Relative: 20 %
NEUTROS ABS: 4 10*3/uL (ref 1.7–7.7)
NEUTROS PCT: 62 %
Platelets: 209 10*3/uL (ref 150–400)
RBC: 4.55 MIL/uL (ref 4.22–5.81)
RDW: 13 % (ref 11.5–15.5)
WBC: 6.5 10*3/uL (ref 4.0–10.5)

## 2016-10-08 LAB — I-STAT CG4 LACTIC ACID, ED: LACTIC ACID, VENOUS: 1.43 mmol/L (ref 0.5–1.9)

## 2016-10-08 MED ORDER — IBUPROFEN 800 MG PO TABS
800.0000 mg | ORAL_TABLET | Freq: Once | ORAL | Status: DC
  Filled 2016-10-08: qty 1

## 2016-10-08 MED ORDER — ACETAMINOPHEN 325 MG PO TABS
650.0000 mg | ORAL_TABLET | Freq: Once | ORAL | Status: AC
  Administered 2016-10-08: 650 mg via ORAL
  Filled 2016-10-08: qty 2

## 2016-10-08 MED ORDER — ACETAMINOPHEN 325 MG PO TABS
ORAL_TABLET | ORAL | Status: AC
  Filled 2016-10-08: qty 2

## 2016-10-08 MED ORDER — DOXYCYCLINE HYCLATE 100 MG PO TABS
100.0000 mg | ORAL_TABLET | Freq: Once | ORAL | Status: AC
  Administered 2016-10-08: 100 mg via ORAL
  Filled 2016-10-08: qty 1

## 2016-10-08 MED ORDER — SODIUM CHLORIDE 0.9 % IV BOLUS (SEPSIS)
1000.0000 mL | Freq: Once | INTRAVENOUS | Status: AC
  Administered 2016-10-08: 1000 mL via INTRAVENOUS

## 2016-10-08 MED ORDER — DOXYCYCLINE HYCLATE 100 MG PO CAPS: 100.0000 mg | ORAL_CAPSULE | Freq: Two times a day (BID) | ORAL | 0 refills | Status: DC

## 2016-10-08 NOTE — ED Provider Notes (Signed)
TIME SEEN: 3:08 AM  CHIEF COMPLAINT: Fever, rash  HPI: Patient is a 36 year old male with history of factor V Leiden mutation on Xarelto who presents emergency department fever. States he has had fever for the past 4-5 days. Has been seen in the emergency department June 19, June 20 and June 21 for the same. Had unremarkable labs other than mild leukocytosis and 2 negative chest x-rays during this time. States he called the on-call nurse today who told him to look for rashes and he states that his girlfriend found a erythematous and warm area to the posterior right scapula. 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 He denies headache, neck pain or neck stiffness, sore throat, ear pain, cough, chest pain or shortness of breath, vomiting, diarrhea, dysuria, abdominal pain, extremity pain. No sick contacts or recent international travel. No other rash noted.  ROS: See HPI Constitutional: fever  Eyes: no drainage  ENT: no runny nose   Cardiovascular:  no chest pain  Resp: no SOB  GI: no vomiting GU: no dysuria Integumentary: no rash, area of erythema noted to the posterior right scapula Allergy: no hives  Musculoskeletal: no leg swelling  Neurological: no slurred speech ROS otherwise negative  PAST MEDICAL HISTORY/PAST SURGICAL HISTORY:  Past Medical History:  Diagnosis Date  . Anxiety and depression   . DVT (deep venous thrombosis) (HCC)    Pt unsure of date   . Factor V Leiden mutation (HCC)    With resultant hypercoaguable state, Hx of DVT, PE, SP IVC filter placement, on chronic coumadin and requires high doses to maintine IRN 2-3.  Marland Kitchen. GERD (gastroesophageal reflux disease)    history of  . Myocardial infarction (HCC)   . PE (pulmonary thromboembolism) (HCC)    unsure of date  . Pneumonia   . Pneumothorax 8/11   HX of, requiring chest tube/hospitalization.   . Venous stasis    bilateral LE.    MEDICATIONS:  Prior to Admission medications    Medication Sig Start Date End Date Taking? Authorizing Provider  acetaminophen (TYLENOL) 500 MG tablet Take 1 tablet (500 mg total) by mouth every 6 (six) hours as needed. Patient not taking: Reported on 12/03/2015 12/03/15   Cheri Fowlerose, Kayla, PA-C  amoxicillin-clavulanate (AUGMENTIN) 875-125 MG tablet Take 1 tablet by mouth every 12 (twelve) hours. Patient not taking: Reported on 10/04/2016 07/08/16   Garlon HatchetSanders, Lisa M, PA-C  HYDROcodone-acetaminophen (NORCO/VICODIN) 5-325 MG tablet Take 2 tablets by mouth every 4 (four) hours as needed. Patient not taking: Reported on 07/08/2016 12/03/15   Roxy HorsemanBrowning, Robert, PA-C  oxyCODONE (OXY IR/ROXICODONE) 5 MG immediate release tablet Take 1 tablet (5 mg total) by mouth every 6 (six) hours as needed for moderate pain. Patient not taking: Reported on 10/04/2016 07/13/16   Dara Lordshyne, Samantha J, PA-C  rivaroxaban (XARELTO) 20 MG TABS tablet Take 1 tablet (20 mg total) by mouth daily with supper. 07/13/16   Rhyne, Ames CoupeSamantha J, PA-C  Rivaroxaban 15 & 20 MG TBPK Take as directed on package: Start with one 15mg  tablet by mouth twice a day with food. On Day 22, switch to one 20mg  tablet once a day with food. Patient not taking: Reported on 10/04/2016 07/08/16   Garlon HatchetSanders, Lisa M, PA-C    ALLERGIES:  No Active Allergies  SOCIAL HISTORY:  Social History  Substance Use Topics  . Smoking status: Former Smoker    Packs/day: 1.00    Years: 20.00    Types: Cigarettes  Quit date: 07/07/2016  . Smokeless tobacco: Former Neurosurgeon     Comment: Started smokin at age 74.   Marland Kitchen Alcohol use No    FAMILY HISTORY: History reviewed. No pertinent family history.  EXAM: BP 103/83 (BP Location: Right Arm)   Pulse 99   Temp 100.1 F (37.8 C) (Oral)   Resp (!) 21   SpO2 99%  CONSTITUTIONAL: Alert and oriented and responds appropriately to questions. Well-appearing; well-nourished HEAD: Normocephalic EYES: Conjunctivae clear, pupils appear equal, EOMI ENT: normal nose; moist mucous membranes;  No pharyngeal erythema or petechiae, no tonsillar hypertrophy or exudate, no uvular deviation, no unilateral swelling, no trismus or drooling, no muffled voice, normal phonation, no stridor, multiple dental caries present, no drainable dental abscess noted, no Ludwig's angina, tongue sits flat in the bottom of the mouth, no angioedema, no facial erythema or warmth, no facial swelling; no pain with movement of the neck.   NECK: Supple, no meningismus, no nuchal rigidity, no LAD  CARD: RRR; S1 and S2 appreciated; no murmurs, no clicks, no rubs, no gallops RESP: Normal chest excursion without splinting or tachypnea; breath sounds clear and equal bilaterally; no wheezes, no rhonchi, no rales, no hypoxia or respiratory distress, speaking full sentences ABD/GI: Normal bowel sounds; non-distended; soft, non-tender, no rebound, no guarding, no peritoneal signs, no hepatosplenomegaly BACK:  The back appears normal and is non-tender to palpation, there is no CVA tenderness EXT: Normal ROM in all joints; non-tender to palpation; no edema; normal capillary refill; no cyanosis, no calf tenderness or swelling    SKIN: Normal color for age and race; warm; no rash, Patient has a 4 x 5 cm area of redness and warmth noted to the posterior right scapula. It is not a bull's-eye rash despite nursing notes. There are no petechiae or purpura. No blisters or desquamation. No urticaria. NEURO: Moves all extremities equally PSYCH: The patient's mood and manner are appropriate. Grooming and personal hygiene are appropriate.  MEDICAL DECISION MAKING: Patient here with fever of unknown origin. It appears he may have cellulitis to the right scapula or possible tick borne illness. I have sent off titers for Lyme disease and RMSF. We'll send blood cultures as well. Labs pending. I do not feel he needs repeat chest x-ray as he has had 2 negative chest x-ray in the past week. Given antipyretics in the emergency department for his fever.  He has no complaints of pain. No meningismus on exam. Abdominal exam benign. No other rash noted.   ED PROGRESS: His labs here are unremarkable and his leukocytosis has resolved. Urine shows no infection. Lactate is normal. He is not having cough, chest pain or shortness of breath. He has had 2 negative chest x-rays. I do not feel this needs to be repeated. I feel he is safe to be discharged home with doxycycline that we treat him for possible cellulitis versus tickborne illness. He is comfortable with this plan. No signs or symptoms at this time to suggest sepsis, meningitis. Recurrent alternating Tylenol and Motrin for fever and pain. Discussed return precautions.    At this time, I do not feel there is any life-threatening condition present. I have reviewed and discussed all results (EKG, imaging, lab, urine as appropriate) and exam findings with patient/family. I have reviewed nursing notes and appropriate previous records.  I feel the patient is safe to be discharged home without further emergent workup and can continue workup as an outpatient as needed. Discussed usual and customary return precautions. Patient/family  verbalize understanding and are comfortable with this plan.  Outpatient follow-up has been provided if needed. All questions have been answered.     Cullan Launer, Layla Maw, DO 10/08/16 [phone]

## 2016-10-08 NOTE — ED Notes (Signed)
Katie @ [facility] notified of fever upon reassessment of vitals.

## 2016-10-08 NOTE — Discharge Instructions (Signed)
You may alternate Tylenol 1000 mg every 6 hours as needed for pain and Ibuprofen 800 mg every 8 hours as needed for pain.  Please take Ibuprofen with food. ° °

## 2016-10-08 NOTE — ED Notes (Signed)
Pt report from Lesly Dukesasey W RN. Pt sleeping on arrival to room. Easily arousable. No IV. Started IV and drew blood. Pt denies pain at present. Fluids started.

## 2016-10-10 LAB — MISC LABCORP TEST (SEND OUT): Labcorp test code: 9985

## 2016-10-11 LAB — ROCKY MTN SPOTTED FVR ABS PNL(IGG+IGM)
RMSF IgG: NEGATIVE
RMSF IgM: 0.59 index (ref 0.00–0.89)

## 2016-10-13 LAB — CULTURE, BLOOD (ROUTINE X 2)
Culture: NO GROWTH
Special Requests: ADEQUATE

## 2016-10-20 ENCOUNTER — Ambulatory Visit (INDEPENDENT_AMBULATORY_CARE_PROVIDER_SITE_OTHER): Payer: BLUE CROSS/BLUE SHIELD | Admitting: Internal Medicine

## 2016-10-20 ENCOUNTER — Ambulatory Visit (INDEPENDENT_AMBULATORY_CARE_PROVIDER_SITE_OTHER): Payer: BLUE CROSS/BLUE SHIELD | Admitting: Pharmacist

## 2016-10-20 VITALS — BP 120/70 | HR 87 | Temp 98.3°F | Ht 72.0 in | Wt 238.8 lb

## 2016-10-20 DIAGNOSIS — Z7901 Long term (current) use of anticoagulants: Secondary | ICD-10-CM

## 2016-10-20 DIAGNOSIS — Z86718 Personal history of other venous thrombosis and embolism: Secondary | ICD-10-CM | POA: Diagnosis not present

## 2016-10-20 DIAGNOSIS — D6851 Activated protein C resistance: Secondary | ICD-10-CM | POA: Diagnosis not present

## 2016-10-20 DIAGNOSIS — I82491 Acute embolism and thrombosis of other specified deep vein of right lower extremity: Secondary | ICD-10-CM

## 2016-10-20 DIAGNOSIS — I872 Venous insufficiency (chronic) (peripheral): Secondary | ICD-10-CM | POA: Diagnosis not present

## 2016-10-20 DIAGNOSIS — D6859 Other primary thrombophilia: Secondary | ICD-10-CM | POA: Diagnosis not present

## 2016-10-20 LAB — POCT INR: INR: 1

## 2016-10-20 MED ORDER — ENOXAPARIN SODIUM 150 MG/ML ~~LOC~~ SOLN: 150.0000 mg | SUBCUTANEOUS | 0 refills | Status: DC

## 2016-10-20 MED ORDER — WARFARIN SODIUM 10 MG PO TABS: 10.0000 mg | ORAL_TABLET | Freq: Every day | ORAL | 0 refills | Status: DC

## 2016-10-20 NOTE — Progress Notes (Signed)
Anticoagulation Management [name] [name] is a 36 y.o. male who reports to the clinic for monitoring of warfarin treatment.  Indication: history of PE and DVT, factor V leiden mutation, IVC filter Duration: indefinite Supervising physician: Carlynn PurlErik Hoffman  Anticoagulation Clinic Visit History: Patient does not report signs/symptoms of bleeding or thromboembolism  Anticoagulation Episode Summary    Current INR goal:   2.0-3.0  TTR:   7.7 % (4.9 y)  Next INR check:   10/24/2016  INR from last check:   1.0! (10/20/2016)  Weekly max warfarin dose:     Target end date:   Indefinite  INR check location:   [facility]  Preferred lab:     Send INR reminders to:   [facility]   Indications   PRIMARY HYPERCOAGULABLE STATE [D68.59] DVT [I82.409] Long term current use of anticoagulant [Z79.01]       Comments:         Anticoagulation Care Providers    Provider Role Specialty Phone number   Burns SpainButcher, Elizabeth A, MD  Internal Medicine [phone]     ASSESSMENT Recent Results: Lab Results  Component Value Date   INR 1.0 10/20/2016   INR 1.43 07/11/2016   INR 1.07 06/16/2015   Anticoagulation Dosing: INR as of 10/20/2016 and Previous Warfarin Dosing Information    INR Dt INR Goal Wkly Tot Sun Mon Tue Wed Thu Fri Sat   10/20/2016 1.0 2.0-3.0 0 mg 0 mg 0 mg 0 mg 0 mg 0 mg 0 mg 0 mg   Patient deviated from recommended dosing.       Anticoagulation Warfarin Dose Instructions as of 10/20/2016      Total Sun Mon Tue Wed Thu Fri Sat   New Dose 100 mg 25 mg 0 mg 0 mg 0 mg 25 mg 25 mg 25 mg     (5 mg x 5)  -  -  -  (5 mg x 5)  (5 mg x 5)  (5 mg x 5)                           INR today: Subtherapeutic  PLAN Patient was initiated on enoxaparin 1.5 mg/kg (150 mg total) daily, overlap with warfarin initiated at 5 mg daily until therapeutic INR  Patient Instructions  Patient educated about medication as defined in this encounter and verbalized understanding by repeating back  instructions provided.   Patient advised to contact clinic or seek medical attention if signs/symptoms of bleeding or thromboembolism occur.  Patient verbalized understanding by repeating back information and was advised to contact me if further medication-related questions arise. Patient was also provided an information handout.  Follow-up Return in about 4 days (around 10/24/2016).  Marzetta BoardJennifer Kim

## 2016-10-20 NOTE — Assessment & Plan Note (Signed)
Given the patient's confirmed Factor V Leiden state and history of multiple VTEs, the patient requires anti-coagulation therapy for life. He stopped taking Xarelto two days ago due to hight cost and will be switched to warfarin. The patient's INR in clinic was 1.0, with his target INR = 2.0 or 3.0. He was given a sample of Lovenox to use for the next 5 days (dosed at 1.5x mg/kg = 150 mg per day). He will take this while he restarts his warfarin therapy. He will follow up in 4 days for an INR check. He will return to see his PCP in 6 months to follow up.

## 2016-10-20 NOTE — Patient Instructions (Addendum)
Thanks for seeing us today.  Please give yourself Lovenox injections as described on package. Please pick up your coumadin and begin taking one 10 mg tablet per day as soon as possible.   Please return to clinic on Monday July 9 for an INR check.

## 2016-10-20 NOTE — Progress Notes (Signed)
   CC: Follow up of chronic DVT/hypercoaguable state  HPI:  [name] is a 36 y.o. with a PMH of Factor V Leiden who is here for follow up of recent DVT and management of his chronic hypercoagulable state. The patient has previously been taking Xarelto for anticoagulation but stopped taking this medication two days ago. He says he can no longer afford to take this therapy, as it costs $100 per month to fill. The patient was previously on warfarin for management of his clotting disorder and tolerated this medication well. He currently denies chest pain, SOB, and unilateral lower extremity swelling and tenderness. He endorses chronic lower extremity swelling and overlying skin changes, with minor skin ulceration on the medial aspect of his left ankle. This is unchanged from baseline and improves with use of compression stockings throughout the day.   Of note, the patient was recently seen in the ED for ongoing fevers. He is currently taking doxycycline for possible tick-borne illness and is tolerating this medication well. He has not noticed any new rashes. He denies new fevers.   Past Medical History:  Diagnosis Date  . Anxiety and depression   . DVT (deep venous thrombosis) (HCC)    Pt unsure of date   . Factor V Leiden mutation (HCC)    With resultant hypercoaguable state, Hx of DVT, PE, SP IVC filter placement, on chronic coumadin and requires high doses to maintine IRN 2-3.  Marland Kitchen. GERD (gastroesophageal reflux disease)    history of  . Myocardial infarction (HCC)   . PE (pulmonary thromboembolism) (HCC)    unsure of date  . Pneumonia   . Pneumothorax 8/11   HX of, requiring chest tube/hospitalization.   . Venous stasis    bilateral LE.   Review of Systems:   Patient denies abdominal pain, changes in bowel and bladder habits, fevers, chills, and headaches.  Patient endorses chronic skin changes, including dry skin and discoloration, over his ankles and lower shins  bilaterally  Physical Exam:  Vitals:   10/20/16 1545  BP: 120/70  Pulse: 87  Temp: 98.3 F (36.8 C)  TempSrc: Oral  SpO2: 99%  Weight: 238 lb 12.8 oz (108.3 kg)  Height: 6' (1.829 m)   Physical Exam  Cardiovascular: Normal rate, regular rhythm, normal heart sounds and intact distal pulses.   Pulmonary/Chest: Effort normal and breath sounds normal. No respiratory distress. He has no wheezes.  Abdominal: Soft. He exhibits no distension. There is no tenderness.  Musculoskeletal:  Bilateral skin darkening and swelling over the lower extremities from mid foot to mid shin. Mild skin peeling throughout areas of discoloration. Small, circular, 1.5cm wide, well healing ulceration with granulation tissue present on medial aspect of left ankle.   Skin: Skin is warm and dry. Capillary refill takes less than 2 seconds. There is erythema (on lower extremities as described above).    Assessment & Plan:   See Encounters Tab for problem based charting.  Patient seen with Dr. Cleda DaubE. Hoffman.

## 2016-10-20 NOTE — Patient Instructions (Signed)
Patient educated about medication as defined in this encounter and verbalized understanding by repeating back instructions provided.   

## 2016-10-20 NOTE — Assessment & Plan Note (Addendum)
The patient reports chronic problems with lower extremity swelling. He states that the discoloration and overlying skin changes observed in clinic today have been stable for many months, but have been slowly improving with use of compression stockings. He was told to continue dressing the small, 1.5 cm circular wound on his left ankle with anti-biotic ointment and guaze. He was instructed to continue using compression stockings throughout the workday, each day.

## 2016-10-24 ENCOUNTER — Ambulatory Visit (INDEPENDENT_AMBULATORY_CARE_PROVIDER_SITE_OTHER): Payer: BLUE CROSS/BLUE SHIELD | Admitting: Pharmacist

## 2016-10-24 DIAGNOSIS — Z86718 Personal history of other venous thrombosis and embolism: Secondary | ICD-10-CM | POA: Diagnosis not present

## 2016-10-24 DIAGNOSIS — D6859 Other primary thrombophilia: Secondary | ICD-10-CM | POA: Diagnosis not present

## 2016-10-24 DIAGNOSIS — Z7901 Long term (current) use of anticoagulants: Secondary | ICD-10-CM

## 2016-10-24 DIAGNOSIS — I82409 Acute embolism and thrombosis of unspecified deep veins of unspecified lower extremity: Secondary | ICD-10-CM

## 2016-10-24 LAB — POCT INR: INR: 1.2

## 2016-10-24 NOTE — Progress Notes (Signed)
Anticoagulation Management [name] [name] is a 36 y.o. male who reports to the clinic for monitoring of warfarin treatment.    Indication: DVT , history of, requiring long-term anticoagulation. Previously on rivaroxaban but costs had become too exorbitant based upon his insurer's copay Patient was transitioned to enoxaparin 1.5mg /kg/QD + warfarin bridge for 5 days; will continue bridge until completion of prescribed number of syringes. Will INCREASE his dose based upon historical dosing requirements from 2014 and earlier which saw him requiring 25mg  alternating with 20mg  every other day of warfarin. Called his pharmacy to confirm he has 10mg  strength warfarin tablets.  Duration: indefinite Supervising physician: Debe Coder  Anticoagulation Clinic Visit History: Patient does not report signs/symptoms of bleeding or thromboembolism  Other recent changes: No diet, medications, lifestyle changes endorsed by the patient.  Anticoagulation Episode Summary    Current INR goal:   2.0-3.0  TTR:   7.7 % (4.9 y)  Next INR check:   10/31/2016  INR from last check:   1.2! (10/24/2016)  Weekly max warfarin dose:     Target end date:   Indefinite  INR check location:   [facility]  Preferred lab:     Send INR reminders to:   [facility]   Indications   PRIMARY HYPERCOAGULABLE STATE [D68.59] Acute thromboembolism of deep veins of lower extremity (HCC) [I82.409] Long term current use of anticoagulant [Z79.01]       Comments:         Anticoagulation Care Providers    Provider Role Specialty Phone number   Burns Spain, MD  Internal Medicine [phone]     ASSESSMENT Recent Results: The most recent result is correlated with 10mg  per day since last Thursday with concomitant LMWH 1.5mg /kg/QD. Lab Results  Component Value Date   INR 1.2 10/24/2016   INR 1.0 10/20/2016   INR 1.43 07/11/2016    Anticoagulation Dosing: INR as of 10/24/2016 and Previous Warfarin Dosing Information    INR Dt INR Goal [name] [name]   10/24/2016 1.2 2.0-3.0 40 mg 10 mg 0 mg 0 mg 0 mg 10 mg 10 mg 10 mg   Patient deviated from recommended dosing.       Anticoagulation Warfarin Dose Instructions as of 10/24/2016      Total Sun Mon Tue Wed Thu Fri [name]   New Dose 77.5 mg 10 mg 12.5 mg 12.5 mg 10 mg 12.5 mg 10 mg 10 mg     (5 mg x 2)  (5 mg x 2.5)  (5 mg x 2.5)  (5 mg x 2)  (5 mg x 2.5)  (5 mg x 2)  (5 mg x 2)                         Description   Called his [facility] and confirmed:  10mg  strength white warfarin tablets in bottle--states he has been taking ONLY ONE (1) tablet by mouth each day since dispensed/prescribed. Will continue enoxaparin until completed. Will use historical dosing data (had in 2014 been on alternating 25/20mg  every-other-day).      INR today: Subtherapeutic  PLAN Weekly dose was increased by 20% to 155mg  per week  Patient Instructions  Patient instructed to take medications as defined in the Anti-coagulation Track section of this encounter.  Patient instructed to take today's dose.  Patient instructed to CONTINUE with remaining enoxaparin/Lovenox injections until you have finished all prescribed.  Patient instructed to  take 2&1/2 tablets of your 10mg  strength white warfarin tablets on Monday, Tuesday, Thursday; on Wednesday, Friday, Saturday and Sunday--take TWO (2) tablets of your 10mg  strength white warfarin tablets.  Patient verbalized understanding of these instructions.    Patient advised to contact clinic or seek medical attention if signs/symptoms of bleeding or thromboembolism occur.  Patient verbalized understanding by repeating back information and was advised to contact me if further medication-related questions arise. Patient was also provided an information handout.  Follow-up Return in 7 days (on 10/31/2016) for Follow up INR at 0830h.  Gar PontoGroce III, Ademide Schaberg Boyd PharmD, CACP, CPP  15 minutes spent face-to-face with the  patient during the encounter. 50% of time spent on education. 50% of time was spent on finger-stick point of care INR sample collection, processing, interpretation, calling his pharmacy to confirm his tablet strength of warfarin as 10mg  and data-entry into EPIC/CHL and [email or web address].

## 2016-10-24 NOTE — Patient Instructions (Signed)
Patient instructed to take medications as defined in the Anti-coagulation Track section of this encounter.  Patient instructed to take today's dose.  Patient instructed to CONTINUE with remaining enoxaparin/Lovenox injections until you have finished all prescribed.  Patient instructed to take 2&1/2 tablets of your 10mg  strength white warfarin tablets on Monday, Tuesday, Thursday; on Wednesday, Friday, Saturday and Sunday--take TWO (2) tablets of your 10mg  strength white warfarin tablets.  Patient verbalized understanding of these instructions.

## 2016-10-25 NOTE — Progress Notes (Signed)
INTERNAL MEDICINE TEACHING ATTENDING ADDENDUM - Gust RungHoffman, Samad Thon C, DO Duration- indefinate, Indication- recurrent VTE, INR- 1.0 Given his previous dose of alternating 25mg  and 20mg  daily on previous coumadin dosing will have the patient increase to 10mg  daily until his repeat INR check on Monday.

## 2016-10-25 NOTE — Progress Notes (Signed)
Internal Medicine Clinic Attending  I saw and evaluated the patient.  I personally confirmed the key portions of the history and exam documented by Dr. Saunders RevelNedrud and I reviewed pertinent patient test results.  The assessment, diagnosis, and plan were formulated together and I agree with the documentation in the resident's note. To add: we started him on 10mg  of coumadin daily until his INR check on Monday.

## 2016-10-25 NOTE — Progress Notes (Signed)
I reviewed Dr. Saralyn PilarGroce's note.  Bridging to coumadin currently and INR low.  Follow up as noted in Dr. Saralyn PilarGroce's note.

## 2016-10-31 ENCOUNTER — Ambulatory Visit (INDEPENDENT_AMBULATORY_CARE_PROVIDER_SITE_OTHER): Payer: BLUE CROSS/BLUE SHIELD | Admitting: Pharmacist

## 2016-10-31 DIAGNOSIS — I82409 Acute embolism and thrombosis of unspecified deep veins of unspecified lower extremity: Secondary | ICD-10-CM

## 2016-10-31 DIAGNOSIS — D6859 Other primary thrombophilia: Secondary | ICD-10-CM | POA: Diagnosis not present

## 2016-10-31 DIAGNOSIS — Z7901 Long term (current) use of anticoagulants: Secondary | ICD-10-CM

## 2016-10-31 LAB — POCT INR: INR: 2.6

## 2016-10-31 MED ORDER — WARFARIN SODIUM 10 MG PO TABS: 20.0000 mg | ORAL_TABLET | Freq: Every day | ORAL | 2 refills | Status: DC

## 2016-10-31 NOTE — Progress Notes (Signed)
Anticoagulation Management [name] [name] is a [age] y.o. male who reports to the clinic for monitoring of warfarin treatment.    Indication: DVT , recurrent with known primary hypercoagulable state--had been on a DOAC but could no longer afford. Was transitioned back upon warfarin.  Duration: indefinite Supervising physician: Blanch MediaElizabeth Butcher  Anticoagulation Clinic Visit History: Patient does not report signs/symptoms of bleeding or thromboembolism  Other recent changes: No diet, medications, lifestyle changes endorsed by the patient.  Anticoagulation Episode Summary    Current INR goal:   2.0-3.0  TTR:   7.8 % (4.9 y)  Next INR check:   11/28/2016  INR from last check:   2.60 (10/31/2016)  Weekly max warfarin dose:     Target end date:   Indefinite  INR check location[facility]   [facility]  Preferred lab:     Send INR reminders to:   [facility]   Indications   PRIMARY HYPERCOAGULABLE STATE [D68.59] Acute thromboembolism of deep veins of lower extremity (HCC) [I82.409] Long term current use of anticoagulant [Z79.01]       Comments:         Anticoagulation Care Providers    Provider Role Specialty Phone number   Burns SpainButcher, Elizabeth A, MD  Internal Medicine [phone]     ASSESSMENT Recent Results: The most recent result is correlated with 155 mg per week: Lab Results  Component Value Date   INR 2.60 10/31/2016   INR 1.2 10/24/2016   INR 1.0 10/20/2016    Anticoagulation Dosing: INR as of 10/31/2016 and Previous Warfarin Dosing Information    INR Dt [facility] Goal [facility] Sun Mon Tue Wed Thu Fri Sat   10/31/2016 2.60 2.0-3.0 155 mg 20 mg 25 mg 25 mg 20 mg 25 mg 20 mg 20 mg   Patient deviated from recommended dosing.       Previous description   [facility]  10mg  strength white warfarin tablets in bottle--states he has been taking ONLY ONE (1) tablet by mouth each day since dispensed/prescribed. Will continue enoxaparin until completed. Will use  historical dosing data (had in 2014 been on alternating 25/20mg  every-other-day).    Anticoagulation Warfarin Dose Instructions as of 10/31/2016      Total Sun Mon Tue Wed Thu Fri Sat   New Dose 140 mg 20 mg 20 mg 20 mg 20 mg 20 mg 20 mg 20 mg     (10 mg x 2)  (10 mg x 2)  (10 mg x 2)  (10 mg x 2)  (10 mg x 2)  (10 mg x 2)  (10 mg x 2)                         Description   Take TWO (2) of your 10mg  strength warfarin tablets by mouth once-daily at [facility]6PM each day.      INR today: Therapeutic  PLAN Weekly dose was decreased by 10% to 140 mg per week  Patient Instructions  Patient instructed to take medications as defined in the Anti-coagulation Track section of this encounter.  Patient instructed to take today's dose.  Patient instructed to take  TWO (2) of your 10mg  strength warfarin tablets by mouth once-daily at [facility]6PM each day. Patient verbalized understanding of these instructions.     Patient advised to contact clinic or seek medical attention if signs/symptoms of bleeding or thromboembolism occur.  Patient verbalized understanding by repeating back information and was advised  to contact me if further medication-related questions arise. Patient was also provided an information handout.  Follow-up Return in 4 weeks (on 11/28/2016) for Follow up INR at 0830h.  Gar Ponto, PharmD, CACP, CPP  15 minutes spent face-to-face with the patient during the encounter. 50% of time spent on education. 50% of time was spent on fingerstick point of care INR sample collection, processing, interpretation and data entry in to EPIC/CHL and [email or web address].

## 2016-10-31 NOTE — Patient Instructions (Signed)
Patient instructed to take medications as defined in the Anti-coagulation Track section of this encounter.  Patient instructed to take today's dose.  Patient instructed to take  TWO (2) of your 10mg  strength warfarin tablets by mouth once-daily at [facility]6PM each day. Patient verbalized understanding of these instructions.

## 2016-11-28 ENCOUNTER — Ambulatory Visit: Payer: BLUE CROSS/BLUE SHIELD

## 2017-09-06 ENCOUNTER — Emergency Department (HOSPITAL_COMMUNITY)
Admission: EM | Admit: 2017-09-06 | Discharge: 2017-09-06 | Disposition: A | Discharge status: Home / Self Care | Payer: Self-pay | Attending: Emergency Medicine | Admitting: Emergency Medicine

## 2017-09-06 ENCOUNTER — Other Ambulatory Visit: Payer: Self-pay

## 2017-09-06 ENCOUNTER — Emergency Department (HOSPITAL_BASED_OUTPATIENT_CLINIC_OR_DEPARTMENT_OTHER): Payer: Self-pay

## 2017-09-06 ENCOUNTER — Encounter (HOSPITAL_COMMUNITY): Payer: Self-pay | Admitting: Emergency Medicine

## 2017-09-06 DIAGNOSIS — Z86718 Personal history of other venous thrombosis and embolism: Secondary | ICD-10-CM

## 2017-09-06 DIAGNOSIS — D6851 Activated protein C resistance: Secondary | ICD-10-CM | POA: Insufficient documentation

## 2017-09-06 DIAGNOSIS — Z87891 Personal history of nicotine dependence: Secondary | ICD-10-CM | POA: Insufficient documentation

## 2017-09-06 DIAGNOSIS — I82413 Acute embolism and thrombosis of femoral vein, bilateral: Secondary | ICD-10-CM | POA: Insufficient documentation

## 2017-09-06 LAB — BASIC METABOLIC PANEL
Anion gap: 10 (ref 5–15)
BUN: 12 mg/dL (ref 6–20)
CHLORIDE: 107 mmol/L (ref 101–111)
CO2: 21 mmol/L — AB (ref 22–32)
CREATININE: 0.91 mg/dL (ref 0.61–1.24)
Calcium: 9.2 mg/dL (ref 8.9–10.3)
GFR calc Af Amer: >60 mL/min (ref >60)
GFR calc non Af Amer: >60 mL/min (ref >60)
GLUCOSE: 89 mg/dL (ref 65–99)
Potassium: 4.2 mmol/L (ref 3.5–5.1)
Sodium: 138 mmol/L (ref 135–145)

## 2017-09-06 LAB — CBC WITH DIFFERENTIAL/PLATELET
Abs Immature Granulocytes: 0.1 10*3/uL (ref 0.0–0.1)
BASOS ABS: 0.1 10*3/uL (ref 0.0–0.1)
Basophils Relative: 1 %
EOS PCT: 5 %
Eosinophils Absolute: 0.6 10*3/uL (ref 0.0–0.7)
HEMATOCRIT: 42.7 % (ref 39.0–52.0)
HEMOGLOBIN: 14.3 g/dL (ref 13.0–17.0)
Immature Granulocytes: 1 %
LYMPHS ABS: 1.7 10*3/uL (ref 0.7–4.0)
LYMPHS PCT: 16 %
MCH: 30.6 pg (ref 26.0–34.0)
MCHC: 33.5 g/dL (ref 30.0–36.0)
MCV: 91.4 fL (ref 78.0–100.0)
MONO ABS: 1 10*3/uL (ref 0.1–1.0)
Monocytes Relative: 9 %
Neutro Abs: 7.7 10*3/uL (ref 1.7–7.7)
Neutrophils Relative %: 68 %
Platelets: 297 10*3/uL (ref 150–400)
RBC: 4.67 MIL/uL (ref 4.22–5.81)
RDW: 12.9 % (ref 11.5–15.5)
WBC: 11.2 10*3/uL — ABNORMAL HIGH (ref 4.0–10.5)

## 2017-09-06 LAB — I-STAT BETA HCG BLOOD, ED (MC, WL, AP ONLY): I-stat hCG, quantitative: <5 m[IU]/mL (ref <5)

## 2017-09-06 MED ORDER — RIVAROXABAN 15 MG PO TABS
15.0000 mg | ORAL_TABLET | Freq: Once | ORAL | Status: AC
  Administered 2017-09-06: 15 mg via ORAL
  Filled 2017-09-06: qty 1

## 2017-09-06 MED ORDER — RIVAROXABAN (XARELTO) VTE STARTER PACK (15 & 20 MG): ORAL_TABLET | ORAL | 0 refills | Status: DC

## 2017-09-06 NOTE — Discharge Planning (Signed)
EDCM provided Xarelto 30 day free card and instructed how to obtain Rx assistance.

## 2017-09-06 NOTE — ED Notes (Signed)
Pt returning from Venous Study. Results given to EDP

## 2017-09-06 NOTE — Discharge Instructions (Signed)
Take Xarelto as prescribed.  Follow up and establish care with a PCP this week! You need to be on long term anticoagulation medication.  Follow up with Vascular.  Please return if you fall and hit your head, have worsening symptoms, or develop any chest pain, shortness of breath, or cough up blood.   Information on my medicine - XARELTO (rivaroxaban)  This medication education was reviewed with me or my healthcare representative as part of my discharge preparation.  The pharmacist that spoke with me during my hospital stay was:  Sampson Si, [facility]  WHY WAS XARELTO PRESCRIBED FOR YOU? Xarelto was prescribed to treat blood clots that may have been found in the veins of your legs (deep vein thrombosis) or in your lungs (pulmonary embolism) and to reduce the risk of them occurring again.  What do you need to know about Xarelto? The starting dose is one 15 mg tablet taken TWICE daily with food for the FIRST 21 DAYS then on (enter date)  09/28/17  the dose is changed to one 20 mg tablet taken ONCE A DAY with your evening meal.  DO NOT stop taking Xarelto without talking to the health care provider who prescribed the medication.  Refill your prescription for 20 mg tablets before you run out.  After discharge, you should have regular check-up appointments with your healthcare provider that is prescribing your Xarelto.  In the future your dose may need to be changed if your kidney function changes by a significant amount.  What do you do if you miss a dose? If you are taking Xarelto TWICE DAILY and you miss a dose, take it as soon as you remember. You may take two 15 mg tablets (total 30 mg) at the same time then resume your regularly scheduled 15 mg twice daily the next day.  If you are taking Xarelto ONCE DAILY and you miss a dose, take it as soon as you remember on the same day then continue your regularly scheduled once daily regimen the next day. Do not take two doses of Xarelto at  the same time.   Important Safety Information Xarelto is a blood thinner medicine that can cause bleeding. You should call your healthcare provider right away if you experience any of the following: ? Bleeding from an injury or your nose that does not stop. ? Unusual colored urine (red or dark brown) or unusual colored stools (red or black). ? Unusual bruising for unknown reasons. ? A serious fall or if you hit your head (even if there is no bleeding).  Some medicines may interact with Xarelto and might increase your risk of bleeding while on Xarelto. To help avoid this, consult your healthcare provider or pharmacist prior to using any new prescription or non-prescription medications, including herbals, vitamins, non-steroidal anti-inflammatory drugs (NSAIDs) and supplements.  This website has more information on Xarelto: VisitDestination.com.br.

## 2017-09-06 NOTE — ED Notes (Signed)
ED Provider at bedside. 

## 2017-09-06 NOTE — Discharge Planning (Signed)
[facility] consulted regarding benefits check for Xarelto.  Pt does not have insurance and would pay out of pocket prices.  EDCM/EDPharmacy will provide 30 day free trail card.

## 2017-09-06 NOTE — Progress Notes (Signed)
Preliminary notes--Bilateral lower extremities venous duplex exam completed. Positive for DVT involving bilateral femoral distal, popliteal vein, post tibial and peroneal veins. Superficial GSV thrombosed at the region of distal calf bilaterally.  Prominent lymph nodes seen at right groin and left popliteal fossa.  Result called and spoke to the RN Brandi Leak.   Hongying Ranjit Ashurst (RDMS RVT) 09/06/17 12:02 PM

## 2017-09-06 NOTE — ED Provider Notes (Signed)
[facility] EMERGENCY DEPARTMENT Provider Note   CSN: [id number] Arrival date & time: 09/06/17  0746     History   Chief Complaint Chief Complaint  Patient presents with  . Leg Pain    HPI [name] [name] is a 37 y.o. male with a past medical history of factor V Leiden, PE, multiple DVTs who is status post IVC filter placement approximately 10 years ago who presents to the emergent department complaining of recurrent left lower extremity tightness that he believes may be related to a DVT.  Patient states that he is supposed to be on chronic anticoagulation medication including warfarin however he has not been on this for almost 1 year.  He states that he has not followed up on outpatient basis in almost 1 year for management of this.  Patient previously was on Lovenox but states that he has had trouble getting this due to insurance reasons.  He states that he is usually active and walks approximately 1 mile per day while at work.  He states that he is now in a slow season and has had decreased activity.  He has felt over the last 1 week increasing tightness in his lower leg that he feels may be related to a blood clot as he has had previously.  The patient denies any fever, chills, chest pain, shortness of breath, syncope, hemoptysis, abdominal pain, nausea/vomiting/diarrhea.  HPI  Past Medical History:  Diagnosis Date  . Anxiety and depression   . DVT (deep venous thrombosis) (HCC)    Pt unsure of date   . Factor V Leiden mutation (HCC)    With resultant hypercoaguable state, Hx of DVT, PE, SP IVC filter placement, on chronic coumadin and requires high doses to maintine IRN 2-3.  Marland Kitchen GERD (gastroesophageal reflux disease)    history of  . Myocardial infarction (HCC)   . PE (pulmonary thromboembolism) (HCC)    unsure of date  . Pneumonia   . Pneumothorax 8/11   HX of, requiring chest tube/hospitalization.   . Venous stasis    bilateral LE.    Patient Active  Problem List   Diagnosis Date Noted  . Long term current use of anticoagulant 05/09/2010  . Venous stasis dermatitis of both lower extremities 10/28/2008  . PRIMARY HYPERCOAGULABLE STATE 12/18/2007  . Acute thromboembolism of deep veins of lower extremity (HCC) 12/13/2007    Past Surgical History:  Procedure Laterality Date  . ANGIOPLASTY ILLIAC ARTERY Right 07/11/2016   Procedure: balloon ANGIOPLASTY of Inferior vena cava, right external iliac vein, right common femoral vein;  Surgeon: Maeola Harman, MD;  Location: [facility] OR;  Service: Vascular;  Laterality: Right;  . INTRAVASCULAR ULTRASOUND/IVUS Right 07/11/2016   Procedure: INTRAVASCULAR ULTRASOUND/IVUS of right popliteal vein, right common femoral vein, right femoral vein, right exterternal iliac vein, and right internal iliac vein;  Surgeon: Maeola Harman, MD;  Location: [facility] OR;  Service: Vascular;  Laterality: Right;  . LOWER EXTREMITY ANGIOGRAPHY N/A 07/12/2016   Procedure: LYSIS RECHECK;  Surgeon: Nada Libman, MD;  Location: [facility] INVASIVE CV LAB;  Service: Cardiovascular;  Laterality: N/A;  . placement of IVC filter  8/09  . Placement of large-bore right chest tube  8/11  . right minithoracotomy with evacuation of hematoma  8/09  . ULTRASOUND GUIDANCE FOR VASCULAR ACCESS Right 07/11/2016   Procedure: ULTRASOUND GUIDANCE FOR VASCULAR ACCESS;  Surgeon: Maeola Harman, MD;  Location: [facility] OR;  Service: Vascular;  Laterality: Right;  Home Medications    Prior to Admission medications   Medication Sig Start Date End Date Taking? Authorizing Provider  doxycycline (VIBRAMYCIN) 100 MG capsule Take 1 capsule (100 mg total) by mouth 2 (two) times daily. Patient not taking: Reported on 10/31/2016 10/08/16   Ward, Layla Maw, DO  enoxaparin (LOVENOX) 150 MG/ML injection Inject 1 mL (150 mg total) into the skin daily. Patient not taking: Reported on 10/31/2016 10/20/16   Rozann Lesches, MD  warfarin (COUMADIN)  10 MG tablet Take 2 tablets (20 mg total) by mouth daily at 6 PM. 10/31/16 10/31/17  Burns Spain, MD    Family History No family history on file.  Social History Social History   Tobacco Use  . Smoking status: Former Smoker    Packs/day: 1.00    Years: 20.00    Pack years: 20.00    Types: Cigarettes    Start date: 07/07/2016  . Smokeless tobacco: Former Neurosurgeon  . Tobacco comment: 1/2/pak per day  Substance Use Topics  . Alcohol use: No  . Drug use: Yes    Frequency: 1.0 times per week    Types: Marijuana    Comment: last  07/07/16     Allergies   Patient has no known allergies.   Review of Systems Review of Systems  All other systems reviewed and are negative.    Physical Exam Updated Vital Signs BP 126/85   Pulse 99   Temp 98.4 F (36.9 C)   Resp 18   Ht 6' (1.829 m)   Wt 104.3 kg (230 lb)   SpO2 100%   BMI 31.19 kg/m   Physical Exam  Constitutional: He appears well-developed and well-nourished.  HENT:  Head: Normocephalic and atraumatic.  Right Ear: External ear normal.  Left Ear: External ear normal.  Nose: Nose normal.  Mouth/Throat: Uvula is midline, oropharynx is clear and moist and mucous membranes are normal. No tonsillar exudate.  Eyes: Pupils are equal, round, and reactive to light. Right eye exhibits no discharge. Left eye exhibits no discharge. No scleral icterus.  Neck: Trachea normal. Neck supple. No spinous process tenderness present. No neck rigidity. Normal range of motion present.  Cardiovascular: Normal rate, regular rhythm and intact distal pulses.  No murmur heard. Pulses:      Radial pulses are 2+ on the right side, and 2+ on the left side.       Dorsalis pedis pulses are 2+ on the right side, and 2+ on the left side.       Posterior tibial pulses are 2+ on the right side, and 2+ on the left side.  Left calf with mild swelling when compared to the right calf. There is chronic venous stasis changes of the lower legs.     Pulmonary/Chest: Effort normal and breath sounds normal. He exhibits no tenderness.  Patient sating at 100% on room air with good waveform on monitor. No increased work of breathing. No accessory muscle use. Patient is sitting upright, speaking in full sentences without difficulty   Abdominal: Soft. Bowel sounds are normal. There is no tenderness. There is no rebound and no guarding.  Musculoskeletal: He exhibits no edema.  Lymphadenopathy:    He has no cervical adenopathy.  Neurological: He is alert. He has normal strength. No sensory deficit.  Skin: Skin is warm and dry. No rash noted. He is not diaphoretic.  Chronic venous stasis changes of the lower leg.   Psychiatric: He has a normal mood and affect.  Nursing  note and vitals reviewed.    ED Treatments / Results  Labs (all labs ordered are listed, but only abnormal results are displayed) Labs Reviewed  CBC WITH DIFFERENTIAL/PLATELET - Abnormal; Notable for the following components:      Result Value   WBC 11.2 (*)    All other components within normal limits  BASIC METABOLIC PANEL - Abnormal; Notable for the following components:   CO2 21 (*)    All other components within normal limits  I-STAT BETA HCG BLOOD, ED ([facility], [facility], [facility] ONLY)    EKG None  Radiology No results found.   Procedures Procedures (including critical care time)  Medications Ordered in ED Medications  Rivaroxaban (XARELTO) tablet 15 mg (15 mg Oral Given 09/06/17 1615)     Initial Impression / Assessment and Plan / ED Course  I have reviewed the triage vital signs and the nursing notes.  Pertinent labs & imaging results that were available during my care of the patient were reviewed by me and considered in my medical decision making (see chart for details).     37 y.o. male who is supposed to be on chronic anticoagulation for factor V Leiden and has multiple DVTs as well as history of PE and IVC filter placement in the past presenting with lower  extremity tightness that he feels similar to his prior DVTs.  Patient is not currently taking his anticoagulation medicine and has not followed up with his doctor for management of this and proximal he 1 year.  Patient has recent decrease in activity and is concerned this may have caused it.  Patient reports no numbness or tingling.  He is neurovascular intact.  Compartments are soft.  Exam as above.  He does have venous stasis changes of lower extremities.  Bilateral ultrasounds of lower extremities as well as CBC and BMP ordered. Beta-hcg not ordered by myself. Patient without CP, SOB or hypoxia to make me concerned for PE at this time.   Labs reviewed and reassuring.  Initial ultrasound report shows DVT involving bilateral femoral distal, popliteal vein, post tibial and peroneal veins. Do to the extent of thrombosis, will consult vascular.   Dr. Arbie Cookey recommended ultrasound of iliacs and to call him back.   Ultrasound refuses to do this. Will consult vascular department.   Vascular to talk to Dr. Arbie Cookey and call back.   Dr. Arbie Cookey has said to discharge the patient home on anti-coagulation medication and have the patient follow up. No further studies at this time as the DVT does not involve the common femoral.  Will discharge the patient on Xarelto starter pack. Cr wnl.  Patient given coupon for Xarelto starter pack by case management.  Pharmacy has counseled the patient.  He was given first dose of Xarelto in the department.  Patient is to establish care with PCP.  He is listed to be followed by Dr. Levora Dredge.  If he is unable to get in contact with them I have provided him with [place] and wellness referral.  Referral to Dr. Bosie Helper office also given. Specific return precautions discussed. Time was given for all questions to be answered. The patient verbalized understanding and agreement with plan. The patient appears safe for discharge home.  Final Clinical Impressions(s) / ED Diagnoses    Final diagnoses:  Acute deep vein thrombosis (DVT) of femoral vein of both lower extremities [facility])    ED Discharge Orders        Ordered    Rivaroxaban 15 &  20 MG TBPK     09/06/17 1528       Princella Pellegrini 09/06/17 1622    Arby Barrette, MD 09/10/17 [id number]

## 2017-09-06 NOTE — ED Triage Notes (Signed)
Pt has pain to left shin radiating up into the left thigh for 1 week. States hx of DVT with a filter placement and states "I know it is a DVT." No posterior calf of knee pain.

## 2017-09-06 NOTE — ED Notes (Signed)
Patient transported to Ultrasound 

## 2018-04-22 ENCOUNTER — Inpatient Hospital Stay (HOSPITAL_COMMUNITY)
Admission: EM | Admit: 2018-04-22 | Discharge: 2018-04-26 | DRG: 392 | Disposition: A | Discharge status: Home / Self Care | Payer: BLUE CROSS/BLUE SHIELD | Attending: Surgery | Admitting: Surgery

## 2018-04-22 ENCOUNTER — Encounter (HOSPITAL_COMMUNITY): Payer: Self-pay | Admitting: Emergency Medicine

## 2018-04-22 ENCOUNTER — Other Ambulatory Visit: Payer: Self-pay

## 2018-04-22 ENCOUNTER — Emergency Department (HOSPITAL_COMMUNITY): Payer: BLUE CROSS/BLUE SHIELD

## 2018-04-22 DIAGNOSIS — K5720 Diverticulitis of large intestine with perforation and abscess without bleeding: Secondary | ICD-10-CM | POA: Diagnosis not present

## 2018-04-22 DIAGNOSIS — D6851 Activated protein C resistance: Secondary | ICD-10-CM | POA: Diagnosis present

## 2018-04-22 DIAGNOSIS — K5900 Constipation, unspecified: Secondary | ICD-10-CM | POA: Diagnosis present

## 2018-04-22 DIAGNOSIS — Z95828 Presence of other vascular implants and grafts: Secondary | ICD-10-CM | POA: Diagnosis not present

## 2018-04-22 DIAGNOSIS — K219 Gastro-esophageal reflux disease without esophagitis: Secondary | ICD-10-CM | POA: Diagnosis present

## 2018-04-22 DIAGNOSIS — Z86711 Personal history of pulmonary embolism: Secondary | ICD-10-CM | POA: Diagnosis not present

## 2018-04-22 DIAGNOSIS — Z87891 Personal history of nicotine dependence: Secondary | ICD-10-CM | POA: Diagnosis not present

## 2018-04-22 DIAGNOSIS — F1290 Cannabis use, unspecified, uncomplicated: Secondary | ICD-10-CM | POA: Diagnosis present

## 2018-04-22 DIAGNOSIS — I739 Peripheral vascular disease, unspecified: Secondary | ICD-10-CM | POA: Diagnosis present

## 2018-04-22 DIAGNOSIS — D6859 Other primary thrombophilia: Secondary | ICD-10-CM | POA: Diagnosis present

## 2018-04-22 DIAGNOSIS — I878 Other specified disorders of veins: Secondary | ICD-10-CM | POA: Diagnosis present

## 2018-04-22 DIAGNOSIS — F329 Major depressive disorder, single episode, unspecified: Secondary | ICD-10-CM | POA: Diagnosis present

## 2018-04-22 DIAGNOSIS — Z86718 Personal history of other venous thrombosis and embolism: Secondary | ICD-10-CM

## 2018-04-22 DIAGNOSIS — F419 Anxiety disorder, unspecified: Secondary | ICD-10-CM | POA: Diagnosis present

## 2018-04-22 DIAGNOSIS — D6852 Prothrombin gene mutation: Secondary | ICD-10-CM | POA: Diagnosis present

## 2018-04-22 LAB — COMPREHENSIVE METABOLIC PANEL
ALBUMIN: 3.7 g/dL (ref 3.5–5.0)
ALT: 26 U/L (ref 0–44)
ANION GAP: 9 (ref 5–15)
AST: 19 U/L (ref 15–41)
Alkaline Phosphatase: 71 U/L (ref 38–126)
BUN: 13 mg/dL (ref 6–20)
CO2: 24 mmol/L (ref 22–32)
Calcium: 9.6 mg/dL (ref 8.9–10.3)
Chloride: 104 mmol/L (ref 98–111)
Creatinine, Ser: 0.96 mg/dL (ref 0.61–1.24)
GFR calc Af Amer: >60 mL/min (ref >60)
GFR calc non Af Amer: >60 mL/min (ref >60)
GLUCOSE: 103 mg/dL — AB (ref 70–99)
POTASSIUM: 3.9 mmol/L (ref 3.5–5.1)
SODIUM: 137 mmol/L (ref 135–145)
Total Bilirubin: 0.6 mg/dL (ref 0.3–1.2)
Total Protein: 7.5 g/dL (ref 6.5–8.1)

## 2018-04-22 LAB — URINALYSIS, ROUTINE W REFLEX MICROSCOPIC
Bilirubin Urine: NEGATIVE
Glucose, UA: NEGATIVE mg/dL
Ketones, ur: NEGATIVE mg/dL
Leukocytes, UA: NEGATIVE
Nitrite: NEGATIVE
PROTEIN: NEGATIVE mg/dL
SPECIFIC GRAVITY, URINE: 1.018 (ref 1.005–1.030)
pH: 5 (ref 5.0–8.0)

## 2018-04-22 LAB — CBC
HEMATOCRIT: 46.5 % (ref 39.0–52.0)
HEMOGLOBIN: 15.7 g/dL (ref 13.0–17.0)
MCH: 31.5 pg (ref 26.0–34.0)
MCHC: 33.8 g/dL (ref 30.0–36.0)
MCV: 93.4 fL (ref 80.0–100.0)
Platelets: 250 10*3/uL (ref 150–400)
RBC: 4.98 MIL/uL (ref 4.22–5.81)
RDW: 12.8 % (ref 11.5–15.5)
WBC: 16.6 10*3/uL — ABNORMAL HIGH (ref 4.0–10.5)
nRBC: 0 % (ref 0.0–0.2)

## 2018-04-22 LAB — LIPASE, BLOOD: Lipase: 49 U/L (ref 11–51)

## 2018-04-22 MED ORDER — DIPHENHYDRAMINE HCL 50 MG/ML IJ SOLN: 12.5000 mg | Freq: Four times a day (QID) | INTRAMUSCULAR | Status: DC | PRN

## 2018-04-22 MED ORDER — DIPHENHYDRAMINE HCL 12.5 MG/5ML PO ELIX: 12.5000 mg | ORAL_SOLUTION | Freq: Four times a day (QID) | ORAL | Status: DC | PRN

## 2018-04-22 MED ORDER — ACETAMINOPHEN 325 MG PO TABS: 650.0000 mg | ORAL_TABLET | Freq: Four times a day (QID) | ORAL | Status: DC | PRN

## 2018-04-22 MED ORDER — METRONIDAZOLE IN NACL 5-0.79 MG/ML-% IV SOLN: 500.0000 mg | Freq: Once | INTRAVENOUS | Status: DC

## 2018-04-22 MED ORDER — PIPERACILLIN-TAZOBACTAM 3.375 G IVPB 30 MIN: 3.3750 g | Freq: Once | INTRAVENOUS | Status: DC

## 2018-04-22 MED ORDER — SODIUM CHLORIDE 0.9 % IV BOLUS
1000.0000 mL | Freq: Once | INTRAVENOUS | Status: AC
  Administered 2018-04-22: 1000 mL via INTRAVENOUS

## 2018-04-22 MED ORDER — KCL IN DEXTROSE-NACL 20-5-0.45 MEQ/L-%-% IV SOLN
INTRAVENOUS | Status: DC
  Administered 2018-04-22 – 2018-04-24 (×6): via INTRAVENOUS
  Filled 2018-04-22 (×7): qty 1000

## 2018-04-22 MED ORDER — ONDANSETRON HCL 4 MG/2ML IJ SOLN: 4.0000 mg | Freq: Four times a day (QID) | INTRAMUSCULAR | Status: DC | PRN

## 2018-04-22 MED ORDER — CIPROFLOXACIN IN D5W 400 MG/200ML IV SOLN: 400.0000 mg | Freq: Once | INTRAVENOUS | Status: DC

## 2018-04-22 MED ORDER — OXYCODONE HCL 5 MG PO TABS
5.0000 mg | ORAL_TABLET | ORAL | Status: DC | PRN
  Filled 2018-04-22: qty 2

## 2018-04-22 MED ORDER — PIPERACILLIN-TAZOBACTAM 3.375 G IVPB 30 MIN
3.3750 g | Freq: Once | INTRAVENOUS | Status: AC
  Administered 2018-04-22: 3.375 g via INTRAVENOUS
  Filled 2018-04-22 (×2): qty 50

## 2018-04-22 MED ORDER — PIPERACILLIN-TAZOBACTAM 3.375 G IVPB
3.3750 g | Freq: Three times a day (TID) | INTRAVENOUS | Status: DC
  Administered 2018-04-23 – 2018-04-26 (×10): 3.375 g via INTRAVENOUS
  Filled 2018-04-22 (×9): qty 50

## 2018-04-22 MED ORDER — IOHEXOL 300 MG/ML  SOLN
100.0000 mL | Freq: Once | INTRAMUSCULAR | Status: AC | PRN
  Administered 2018-04-22: 100 mL via INTRAVENOUS

## 2018-04-22 MED ORDER — METRONIDAZOLE IN NACL 5-0.79 MG/ML-% IV SOLN: 500.0000 mg | Freq: Three times a day (TID) | INTRAVENOUS | Status: DC

## 2018-04-22 MED ORDER — ENOXAPARIN SODIUM 120 MG/0.8ML ~~LOC~~ SOLN
1.0000 mg/kg | Freq: Once | SUBCUTANEOUS | Status: AC
  Administered 2018-04-22: 115 mg via SUBCUTANEOUS
  Filled 2018-04-22: qty 0.76

## 2018-04-22 MED ORDER — PANTOPRAZOLE SODIUM 40 MG IV SOLR
40.0000 mg | Freq: Every day | INTRAVENOUS | Status: DC
  Administered 2018-04-22 – 2018-04-24 (×3): 40 mg via INTRAVENOUS
  Filled 2018-04-22 (×3): qty 40

## 2018-04-22 MED ORDER — ACETAMINOPHEN 650 MG RE SUPP: 650.0000 mg | Freq: Four times a day (QID) | RECTAL | Status: DC | PRN

## 2018-04-22 MED ORDER — ENOXAPARIN SODIUM 120 MG/0.8ML ~~LOC~~ SOLN
1.0000 mg/kg | Freq: Two times a day (BID) | SUBCUTANEOUS | Status: DC
  Administered 2018-04-23 – 2018-04-26 (×7): 115 mg via SUBCUTANEOUS
  Filled 2018-04-22 (×7): qty 0.76

## 2018-04-22 MED ORDER — ONDANSETRON 4 MG PO TBDP: 4.0000 mg | ORAL_TABLET | Freq: Four times a day (QID) | ORAL | Status: DC | PRN

## 2018-04-22 MED ORDER — MORPHINE SULFATE (PF) 4 MG/ML IV SOLN
4.0000 mg | Freq: Once | INTRAVENOUS | Status: AC
  Administered 2018-04-22: 4 mg via INTRAVENOUS
  Filled 2018-04-22: qty 1

## 2018-04-22 MED ORDER — METRONIDAZOLE IN NACL 5-0.79 MG/ML-% IV SOLN
500.0000 mg | Freq: Once | INTRAVENOUS | Status: AC
  Administered 2018-04-22: 500 mg via INTRAVENOUS
  Filled 2018-04-22: qty 100

## 2018-04-22 MED ORDER — MORPHINE SULFATE (PF) 2 MG/ML IV SOLN
1.0000 mg | INTRAVENOUS | Status: DC | PRN
  Administered 2018-04-22 – 2018-04-24 (×8): 2 mg via INTRAVENOUS
  Filled 2018-04-22 (×8): qty 1

## 2018-04-22 MED ORDER — ONDANSETRON HCL 4 MG/2ML IJ SOLN
4.0000 mg | Freq: Once | INTRAMUSCULAR | Status: AC
  Administered 2018-04-22: 4 mg via INTRAVENOUS
  Filled 2018-04-22: qty 2

## 2018-04-22 NOTE — Progress Notes (Signed)
Patient arrived to 6N29 A&Ox4, VSS, LFA IV intact.  Patient self ambulated from stretcher to bed.  Rates pain 5/10 in LLQ of abdomen.  No family at bedside.  Patient oriented to room and equipment.  Will continue to monitor.

## 2018-04-22 NOTE — ED Provider Notes (Signed)
[facility] EMERGENCY DEPARTMENT Provider Note   CSN: [id number] Arrival date & time: 04/22/18  7588     History   Chief Complaint Chief Complaint  Patient presents with  . Abdominal Pain    HPI [name] [name] is a 38 y.o. male.  HPI Patient presents with 1 day of left lower quadrant abdominal pain.  No associated nausea or vomiting.  Patient has had constipation.  Denies blood in the stool.  No fever or chills.  Pain is worse with movement and palpation.  No previously similar symptoms.  No urinary symptoms including dysuria, flank pain, hematuria or frequency. Past Medical History:  Diagnosis Date  . Anxiety and depression   . DVT (deep venous thrombosis) (HCC)    Pt unsure of date   . Factor V Leiden mutation (HCC)    With resultant hypercoaguable state, Hx of DVT, PE, SP IVC filter placement, on chronic coumadin and requires high doses to maintine IRN 2-3.  Marland Kitchen GERD (gastroesophageal reflux disease)    history of  . Myocardial infarction (HCC)   . PE (pulmonary thromboembolism) (HCC)    unsure of date  . Pneumonia   . Pneumothorax 8/11   HX of, requiring chest tube/hospitalization.   . Venous stasis    bilateral LE.    Patient Active Problem List   Diagnosis Date Noted  . Long term current use of anticoagulant 05/09/2010  . Venous stasis dermatitis of both lower extremities 10/28/2008  . PRIMARY HYPERCOAGULABLE STATE 12/18/2007  . Acute thromboembolism of deep veins of lower extremity (HCC) 12/13/2007    Past Surgical History:  Procedure Laterality Date  . ANGIOPLASTY ILLIAC ARTERY Right 07/11/2016   Procedure: balloon ANGIOPLASTY of Inferior vena cava, right external iliac vein, right common femoral vein;  Surgeon: Maeola Harman, MD;  Location: [facility] OR;  Service: Vascular;  Laterality: Right;  . INTRAVASCULAR ULTRASOUND/IVUS Right 07/11/2016   Procedure: INTRAVASCULAR ULTRASOUND/IVUS of right popliteal vein, right common femoral vein,  right femoral vein, right exterternal iliac vein, and right internal iliac vein;  Surgeon: Maeola Harman, MD;  Location: [facility] OR;  Service: Vascular;  Laterality: Right;  . LOWER EXTREMITY ANGIOGRAPHY N/A 07/12/2016   Procedure: LYSIS RECHECK;  Surgeon: Nada Libman, MD;  Location: [facility] INVASIVE CV LAB;  Service: Cardiovascular;  Laterality: N/A;  . placement of IVC filter  8/09  . Placement of large-bore right chest tube  8/11  . right minithoracotomy with evacuation of hematoma  8/09  . ULTRASOUND GUIDANCE FOR VASCULAR ACCESS Right 07/11/2016   Procedure: ULTRASOUND GUIDANCE FOR VASCULAR ACCESS;  Surgeon: Maeola Harman, MD;  Location: [facility] OR;  Service: Vascular;  Laterality: Right;        Home Medications    Prior to Admission medications   Medication Sig Start Date End Date Taking? Authorizing Provider  enoxaparin (LOVENOX) 150 MG/ML injection Inject 1 mL (150 mg total) into the skin daily. Patient not taking: Reported on 10/31/2016 10/20/16   Rozann Lesches, MD  Rivaroxaban 15 & 20 MG TBPK Take as directed on package: Start with one 15mg  tablet by mouth twice a day with food. On Day 22, switch to one 20mg  tablet once a day with food. Patient not taking: Reported on 04/22/2018 09/06/17   Jacinto Halim, PA-C    Family History No family history on file.  Social History Social History   Tobacco Use  . Smoking status: Former Smoker    Packs/day: 1.00    Years:  20.00    Pack years: 20.00    Types: Cigarettes    Start date: 07/07/2016  . Smokeless tobacco: Former Neurosurgeon  . Tobacco comment: 1/2/pak per day  Substance Use Topics  . Alcohol use: No  . Drug use: Yes    Frequency: 1.0 times per week    Types: Marijuana    Comment: last  07/07/16     Allergies   Patient has no known allergies.   Review of Systems Review of Systems  Constitutional: Negative for chills and fever.  HENT: Negative for trouble swallowing.   Respiratory: Negative for cough and  shortness of breath.   Cardiovascular: Negative for chest pain.  Gastrointestinal: Positive for abdominal pain and constipation. Negative for diarrhea, nausea and vomiting.  Genitourinary: Negative for dysuria, flank pain, frequency and hematuria.  Musculoskeletal: Negative for back pain, myalgias and neck pain.  Skin: Negative for rash and wound.  Neurological: Negative for dizziness, weakness, light-headedness, numbness and headaches.  All other systems reviewed and are negative.    Physical Exam Updated Vital Signs BP 140/76   Pulse 66   Temp 98.3 F (36.8 C) (Oral)   Resp 18   Ht 6' (1.829 m)   Wt 113.4 kg   SpO2 95%   BMI 33.91 kg/m   Physical Exam Vitals signs and nursing note reviewed.  Constitutional:      General: He is not in acute distress.    Appearance: Normal appearance. He is well-developed. He is not ill-appearing.  HENT:     Head: Normocephalic and atraumatic.     Nose: Nose normal.     Mouth/Throat:     Mouth: Mucous membranes are moist.     Pharynx: No oropharyngeal exudate or posterior oropharyngeal erythema.  Eyes:     Extraocular Movements: Extraocular movements intact.     Pupils: Pupils are equal, round, and reactive to light.  Neck:     Musculoskeletal: Normal range of motion and neck supple. No neck rigidity or muscular tenderness.  Cardiovascular:     Rate and Rhythm: Normal rate and regular rhythm.     Heart sounds: No murmur. No friction rub. No gallop.   Pulmonary:     Effort: Pulmonary effort is normal. No respiratory distress.     Breath sounds: Normal breath sounds. No stridor. No wheezing, rhonchi or rales.  Chest:     Chest wall: No tenderness.  Abdominal:     General: Bowel sounds are normal.     Palpations: Abdomen is soft.     Tenderness: There is abdominal tenderness. There is no guarding or rebound.     Comments: Left lower quadrant tenderness with guarding and rebound  Musculoskeletal: Normal range of motion.         General: No tenderness.     Comments: No CVA tenderness bilaterally.  Lymphadenopathy:     Cervical: No cervical adenopathy.  Skin:    General: Skin is warm and dry.     Capillary Refill: Capillary refill takes less than 2 seconds.     Findings: No erythema or rash.  Neurological:     General: No focal deficit present.     Mental Status: He is alert and oriented to person, place, and time.  Psychiatric:        Behavior: Behavior normal.      ED Treatments / Results  Labs (all labs ordered are listed, but only abnormal results are displayed) Labs Reviewed  COMPREHENSIVE METABOLIC PANEL - Abnormal; Notable for  the following components:      Result Value   Glucose, Bld 103 (*)    All other components within normal limits  CBC - Abnormal; Notable for the following components:   WBC 16.6 (*)    All other components within normal limits  URINALYSIS, ROUTINE W REFLEX MICROSCOPIC - Abnormal; Notable for the following components:   Hgb urine dipstick SMALL (*)    Bacteria, UA RARE (*)    All other components within normal limits  LIPASE, BLOOD    EKG None  Radiology Ct Abdomen Pelvis W Contrast  Result Date: 04/22/2018 CLINICAL DATA:  Lower abdominal pain since yesterday. Clinical suspicion for diverticulitis. EXAM: CT ABDOMEN AND PELVIS WITH CONTRAST TECHNIQUE: Multidetector CT imaging of the abdomen and pelvis was performed using the standard protocol following bolus administration of intravenous contrast. CONTRAST:  OMNIPAQUE IOHEXOL 300 MG/ML  SOLN COMPARISON:  12/03/2015 FINDINGS: Lower chest: No acute abnormality. Hepatobiliary: No focal liver abnormality is seen. No gallstones, gallbladder wall thickening, or biliary dilatation. Pancreas: Unremarkable. No pancreatic ductal dilatation or surrounding inflammatory changes. Spleen: Normal in size without focal abnormality. Adrenals/Urinary Tract: Adrenal glands are unremarkable. Kidneys are normal, without renal calculi, focal  lesion, or hydronephrosis. Bladder is unremarkable. Stomach/Bowel: There is extraluminal and air in fluid attenuation along the anterior margin of the lower descending colon with surrounding stranding and hazy inflammation. Area of air and fluid attenuation measures approximately 2.8 cm. There is thickening of the wall the adjacent colon. There are several adjacent uninflamed diverticula. Remainder of the colon is normal in caliber with no wall thickening or other areas of inflammation. Other scattered diverticula are noted. Stomach and small bowel unremarkable.  Normal appendix visualized. Vascular/Lymphatic: Mild retroperitoneal adenopathy, largest node 13 mm in short axis. This is stable from the prior CT. There is a stable vena cava filter, superior tip 3 cm below the right renal vein. Reproductive: Unremarkable. Other: No abdominal wall hernia or abnormality. No abdominopelvic ascites. Musculoskeletal: No fracture or acute finding. No osteoblastic or osteolytic lesions. IMPRESSION: 1. Acute diverticulitis of the distal descending colon. There is localized perforation with extraluminal air and fluid attenuation and surrounding inflammation along the anterior margin of the colon, without a defined abscess. 2. No other acute abnormality within the abdomen or pelvis. Electronically Signed   By: Amie Portland M.D.   On: 04/22/2018 14:22    Procedures Procedures (including critical care time)  Medications Ordered in ED Medications  metroNIDAZOLE (FLAGYL) IVPB 500 mg (has no administration in time range)  piperacillin-tazobactam (ZOSYN) IVPB 3.375 g (has no administration in time range)  morphine 4 MG/ML injection 4 mg (4 mg Intravenous Given 04/22/18 1301)  ondansetron (ZOFRAN) injection 4 mg (4 mg Intravenous Given 04/22/18 1301)  sodium chloride 0.9 % bolus 1,000 mL (1,000 mLs Intravenous New Bag/Given 04/22/18 1301)  iohexol (OMNIPAQUE) 300 MG/ML solution 100 mL (100 mLs Intravenous Contrast Given 04/22/18  1350)   CRITICAL CARE Performed by: Loren Racer Total critical care time: 35 minutes Critical care time was exclusive of separately billable procedures and treating other patients. Critical care was necessary to treat or prevent imminent or life-threatening deterioration. Critical care was time spent personally by me on the following activities: development of treatment plan with patient and/or surrogate as well as nursing, discussions with consultants, evaluation of patient's response to treatment, examination of patient, obtaining history from patient or surrogate, ordering and performing treatments and interventions, ordering and review of laboratory studies, ordering and review of radiographic  studies, pulse oximetry and re-evaluation of patient's condition.  Initial Impression / Assessment and Plan / ED Course  I have reviewed the triage vital signs and the nursing notes.  Pertinent labs & imaging results that were available during my care of the patient were reviewed by me and considered in my medical decision making (see chart for details).      Evidence of diverticulitis with perforation.  No abscess visualized.  Will start on IV Zosyn and Flagyl.  Vital signs remained stable.  Discussed with Dr. Andrey CampanileWilson.  Will see patient in the emergency department and admit. Final Clinical Impressions(s) / ED Diagnoses   Final diagnoses:  Perforated diverticulum of large intestine  Diverticulitis of large intestine with perforation without abscess or bleeding    ED Discharge Orders    None       Loren RacerYelverton, Jahmire Ruffins, MD 04/22/18 1447

## 2018-04-22 NOTE — Progress Notes (Signed)
ANTICOAGULATION CONSULT NOTE - Initial Consult  Pharmacy Consult for Lovenox Indication: hx VTE and Factor V Leiden deficiency  No Known Allergies  Patient Measurements: Height: 6' (182.9 cm) Weight: 250 lb (113.4 kg) IBW/kg (Calculated) : 77.6  Vital Signs: Temp: 98.3 F (36.8 C) (01/05 0939) Temp Source: Oral (01/05 0939) BP: 140/76 (01/05 1330) Pulse Rate: 66 (01/05 1330)  Labs: Recent Labs    04/22/18 1015  HGB 15.7  HCT 46.5  PLT 250  CREATININE 0.96    Estimated Creatinine Clearance: 134.3 mL/min (by C-G formula based on SCr of 0.96 mg/dL).   Medical History: Past Medical History:  Diagnosis Date  . Anxiety and depression   . DVT (deep venous thrombosis) (HCC)    Pt unsure of date   . Factor V Leiden mutation (HCC)    With resultant hypercoaguable state, Hx of DVT, PE, SP IVC filter placement, on chronic coumadin and requires high doses to maintine IRN 2-3.  Marland Kitchen GERD (gastroesophageal reflux disease)    history of  . Myocardial infarction (HCC)   . PE (pulmonary thromboembolism) (HCC)    unsure of date  . Pneumonia   . Pneumothorax 8/11   HX of, requiring chest tube/hospitalization.   . Venous stasis    bilateral LE.    Medications:  No PTA medications  Assessment: 38 y/o male with hx VTE and Factor V Leiden deficiency who presented to the ED with lower abdominal pain found to have diverticulitis with perforation. Surgery consulted. Pharmacy consulted to begin full dose Lovenox for clotting hx. Renal function is normal. CBC is normal.  Confirmed with patient he has taken no blood thinners recently; he has been uninsured and could not afford anything.   Goal of Therapy:  Anti-Xa level 0.6-1 units/ml 4hrs after LMWH dose given Monitor platelets by anticoagulation protocol: Yes   Plan:  Lovenox 115 mg SQ q12h CBC q72h Monitor for s/sx of bleeding   Loura Back, PharmD, BCPS Clinical Pharmacist Clinical phone for 04/22/2018 until 3p is  [phone] 04/22/2018 3:09 PM  **Pharmacist phone directory can now be found on [email or web address] listed under [facility]**

## 2018-04-22 NOTE — H&P (Signed)
NAT WALLMAN is an 38 y.o. male.   Chief Complaint: abd pain HPI: 38 yo wm with Factor V mutation, h/o multiple vtes, s/p IVC filter comes in to ED with c/o LLQ pain which started late Friday or early Saturday. Had similar pain a few years ago but not as severe. States he was treated for diverticulitis with oral abx. No prior cscopy. No melena/hematochezia; no stool caliber change. Denies family hx colorectal cancer. No f/c/n/v  Hasn't been on xarelto for 6 months - insurance issues.  Denies LE pain/swelling.   Uses THC occasionally No etoh or drugs  Past Medical History:  Diagnosis Date  . Anxiety and depression   . DVT (deep venous thrombosis) (HCC)    Pt unsure of date   . Factor V Leiden mutation (HCC)    With resultant hypercoaguable state, Hx of DVT, PE, SP IVC filter placement, on chronic coumadin and requires high doses to maintine IRN 2-3.  Marland Kitchen GERD (gastroesophageal reflux disease)    history of  . Myocardial infarction (HCC)   . PE (pulmonary thromboembolism) (HCC)    unsure of date  . Pneumonia   . Pneumothorax 8/11   HX of, requiring chest tube/hospitalization.   . Venous stasis    bilateral LE.    Past Surgical History:  Procedure Laterality Date  . ANGIOPLASTY ILLIAC ARTERY Right 07/11/2016   Procedure: balloon ANGIOPLASTY of Inferior vena cava, right external iliac vein, right common femoral vein;  Surgeon: Maeola Harman, MD;  Location: [facility] OR;  Service: Vascular;  Laterality: Right;  . INTRAVASCULAR ULTRASOUND/IVUS Right 07/11/2016   Procedure: INTRAVASCULAR ULTRASOUND/IVUS of right popliteal vein, right common femoral vein, right femoral vein, right exterternal iliac vein, and right internal iliac vein;  Surgeon: Maeola Harman, MD;  Location: [facility] OR;  Service: Vascular;  Laterality: Right;  . LOWER EXTREMITY ANGIOGRAPHY N/A 07/12/2016   Procedure: LYSIS RECHECK;  Surgeon: Nada Libman, MD;  Location: [facility] INVASIVE CV LAB;  Service:  Cardiovascular;  Laterality: N/A;  . placement of IVC filter  8/09  . Placement of large-bore right chest tube  8/11  . right minithoracotomy with evacuation of hematoma  8/09  . ULTRASOUND GUIDANCE FOR VASCULAR ACCESS Right 07/11/2016   Procedure: ULTRASOUND GUIDANCE FOR VASCULAR ACCESS;  Surgeon: Maeola Harman, MD;  Location: [facility] OR;  Service: Vascular;  Laterality: Right;    No family history on file. Social History:  reports that he has quit smoking. His smoking use included cigarettes. He started smoking about 21 months ago. He has a 20.00 pack-year smoking history. He has quit using smokeless tobacco. He reports current drug use. Frequency: 1.00 time per week. Drug: Marijuana. He reports that he does not drink alcohol.  Allergies: No Known Allergies  (Not in a hospital admission)   Results for orders placed or performed during the hospital encounter of 04/22/18 (from the past 48 hour(s))  Lipase, blood     Status: None   Collection Time: 04/22/18 10:15 AM  Result Value Ref Range   Lipase 49 11 - 51 U/L    Comment: Performed at [facility], [address] [place], [place] [address]  Comprehensive metabolic panel     Status: Abnormal   Collection Time: 04/22/18 10:15 AM  Result Value Ref Range   Sodium 137 135 - 145 mmol/L   Potassium 3.9 3.5 - 5.1 mmol/L   Chloride 104 98 - 111 mmol/L   CO2 24 22 - 32 mmol/L  Glucose, Bld 103 (H) 70 - 99 mg/dL   BUN 13 6 - 20 mg/dL   Creatinine, Ser 1.610.96 0.61 - 1.24 mg/dL   Calcium 9.6 8.9 - 09.610.3 mg/dL   Total Protein 7.5 6.5 - 8.1 g/dL   Albumin 3.7 3.5 - 5.0 g/dL   AST 19 15 - 41 U/L   ALT 26 0 - 44 U/L   Alkaline Phosphatase 71 38 - 126 U/L   Total Bilirubin 0.6 0.3 - 1.2 mg/dL   GFR calc non Af Amer >60 >60 mL/min   GFR calc Af Amer >60 >60 mL/min   Anion gap 9 5 - 15    Comment: Performed at [facility], [address] [place], [place] [address]  CBC     Status: Abnormal   Collection Time: 04/22/18 10:15  AM  Result Value Ref Range   WBC 16.6 (H) 4.0 - 10.5 K/uL   RBC 4.98 4.22 - 5.81 MIL/uL   Hemoglobin 15.7 13.0 - 17.0 g/dL   HCT 98.146.5 19.139.0 - 47.852.0 %   MCV 93.4 80.0 - 100.0 fL   MCH 31.5 26.0 - 34.0 pg   MCHC 33.8 30.0 - 36.0 g/dL   RDW 29.512.8 62.111.5 - 30.815.5 %   Platelets 250 150 - 400 K/uL   nRBC 0.0 0.0 - 0.2 %    Comment: Performed at [facility], [address] [place], [place] [address]  Urinalysis, Routine w reflex microscopic     Status: Abnormal   Collection Time: 04/22/18 11:41 AM  Result Value Ref Range   Color, Urine YELLOW YELLOW   APPearance CLEAR CLEAR   Specific Gravity, Urine 1.018 1.005 - 1.030   pH 5.0 5.0 - 8.0   Glucose, UA NEGATIVE NEGATIVE mg/dL   Hgb urine dipstick SMALL (A) NEGATIVE   Bilirubin Urine NEGATIVE NEGATIVE   Ketones, ur NEGATIVE NEGATIVE mg/dL   Protein, ur NEGATIVE NEGATIVE mg/dL   Nitrite NEGATIVE NEGATIVE   Leukocytes, UA NEGATIVE NEGATIVE   RBC / HPF 0-5 0 - 5 RBC/hpf   WBC, UA 0-5 0 - 5 WBC/hpf   Bacteria, UA RARE (A) NONE SEEN   Mucus PRESENT     Comment: Performed at [facility], [address] [place], [place] [address]   Ct Abdomen Pelvis W Contrast  Result Date: 04/22/2018 CLINICAL DATA:  Lower abdominal pain since yesterday. Clinical suspicion for diverticulitis. EXAM: CT ABDOMEN AND PELVIS WITH CONTRAST TECHNIQUE: Multidetector CT imaging of the abdomen and pelvis was performed using the standard protocol following bolus administration of intravenous contrast. CONTRAST:  100mL OMNIPAQUE IOHEXOL 300 MG/ML  SOLN COMPARISON:  12/03/2015 FINDINGS: Lower chest: No acute abnormality. Hepatobiliary: No focal liver abnormality is seen. No gallstones, gallbladder wall thickening, or biliary dilatation. Pancreas: Unremarkable. No pancreatic ductal dilatation or surrounding inflammatory changes. Spleen: Normal in size without focal abnormality. Adrenals/Urinary Tract: Adrenal glands are unremarkable. Kidneys are normal, without renal  calculi, focal lesion, or hydronephrosis. Bladder is unremarkable. Stomach/Bowel: There is extraluminal and air in fluid attenuation along the anterior margin of the lower descending colon with surrounding stranding and hazy inflammation. Area of air and fluid attenuation measures approximately 2.8 cm. There is thickening of the wall the adjacent colon. There are several adjacent uninflamed diverticula. Remainder of the colon is normal in caliber with no wall thickening or other areas of inflammation. Other scattered diverticula are noted. Stomach and small bowel unremarkable.  Normal appendix visualized. Vascular/Lymphatic: Mild retroperitoneal adenopathy, largest node  13 mm in short axis. This is stable from the prior CT. There is a stable vena cava filter, superior tip 3 cm below the right renal vein. Reproductive: Unremarkable. Other: No abdominal wall hernia or abnormality. No abdominopelvic ascites. Musculoskeletal: No fracture or acute finding. No osteoblastic or osteolytic lesions. IMPRESSION: 1. Acute diverticulitis of the distal descending colon. There is localized perforation with extraluminal air and fluid attenuation and surrounding inflammation along the anterior margin of the colon, without a defined abscess. 2. No other acute abnormality within the abdomen or pelvis. Electronically Signed   By: Amie Portlandavid  Ormond M.D.   On: 04/22/2018 14:22    Review of Systems  Constitutional: Negative for weight loss.  HENT: Negative for nosebleeds.   Eyes: Negative for blurred vision.  Respiratory: Negative for shortness of breath.   Cardiovascular: Negative for chest pain, palpitations, orthopnea and PND.       Denies DOE  Gastrointestinal: Positive for abdominal pain.  Genitourinary: Negative for dysuria and hematuria.  Musculoskeletal: Negative.   Skin: Negative for itching and rash.  Neurological: Negative for dizziness, focal weakness, seizures, loss of consciousness and headaches.       Denies  TIAs, amaurosis fugax  Endo/Heme/Allergies: Does not bruise/bleed easily.       H/o multiple VTEs; has ivc filter; no anticoag for 13mo due to insurance reasons  Psychiatric/Behavioral: The patient is not nervous/anxious.     Blood pressure 140/76, pulse 66, temperature 98.3 F (36.8 C), temperature source Oral, resp. rate 18, height 6' (1.829 m), weight 113.4 kg, SpO2 95 %. Physical Exam  Vitals reviewed. Constitutional: He is oriented to person, place, and time. He appears well-developed and well-nourished. No distress.  HENT:  Head: Normocephalic and atraumatic.  Right Ear: External ear normal.  Left Ear: External ear normal.  Eyes: Conjunctivae are normal. No scleral icterus.  Neck: Normal range of motion. Neck supple. No tracheal deviation present. No thyromegaly present.  Cardiovascular: Normal rate and normal heart sounds.  Respiratory: Effort normal and breath sounds normal. No stridor. No respiratory distress. He has no wheezes.  GI: Soft. Bowel sounds are normal. He exhibits no distension. There is abdominal tenderness in the suprapubic area and left lower quadrant. There is no rigidity and no rebound.    Soft, nd, +LLQ TTP with some voluntary guarding. No rebound/peritonitis.   Musculoskeletal:        General: No tenderness or edema.  Lymphadenopathy:    He has no cervical adenopathy.  Neurological: He is alert and oriented to person, place, and time. He exhibits normal muscle tone.  Skin: Skin is warm and dry. No rash noted. He is not diaphoretic. No erythema. No pallor.  Chronic b/l LE skin changes -hyperpigmentation, chronic venous insufficiency. No ulcers  Psychiatric: He has a normal mood and affect. His behavior is normal. Judgment and thought content normal.     Assessment/Plan Diverticulitis with microperforation  Factor V mutation H/o multiple vte S/p IVC filter placement  Admit bowel rest, IV abx, serial abd exams Will plan on full anticoagulation given his  significant VTE - lovenox.   Discussed diverticulitis with pt and it's management and probable hospital course.  If fails to improve or worsens - will need repeat ct to see if formed abscess that may need drain  Mary SellaEric M. Andrey CampanileWilson, MD, FACS General, Bariatric, & Minimally Invasive Surgery [facility] [place]   Gaynelle AduEric Marriana Hibberd, MD 04/22/2018, 3:07 PM

## 2018-04-22 NOTE — ED Triage Notes (Signed)
Pt. Stated, Im having lower abdominal pain since yesterday.

## 2018-04-23 LAB — CBC
HCT: 40.9 % (ref 39.0–52.0)
Hemoglobin: 14.3 g/dL (ref 13.0–17.0)
MCH: 32.6 pg (ref 26.0–34.0)
MCHC: 35 g/dL (ref 30.0–36.0)
MCV: 93.2 fL (ref 80.0–100.0)
Platelets: 230 10*3/uL (ref 150–400)
RBC: 4.39 MIL/uL (ref 4.22–5.81)
RDW: 12.9 % (ref 11.5–15.5)
WBC: 9.5 10*3/uL (ref 4.0–10.5)
nRBC: 0 % (ref 0.0–0.2)

## 2018-04-23 LAB — BASIC METABOLIC PANEL
Anion gap: 8 (ref 5–15)
BUN: 10 mg/dL (ref 6–20)
CO2: 23 mmol/L (ref 22–32)
Calcium: 8.9 mg/dL (ref 8.9–10.3)
Chloride: 106 mmol/L (ref 98–111)
Creatinine, Ser: 1 mg/dL (ref 0.61–1.24)
GFR calc Af Amer: >60 mL/min (ref >60)
GFR calc non Af Amer: >60 mL/min (ref >60)
Glucose, Bld: 102 mg/dL — ABNORMAL HIGH (ref 70–99)
Potassium: 3.9 mmol/L (ref 3.5–5.1)
SODIUM: 137 mmol/L (ref 135–145)

## 2018-04-23 LAB — HIV ANTIBODY (ROUTINE TESTING W REFLEX): HIV Screen 4th Generation wRfx: NONREACTIVE

## 2018-04-23 NOTE — Care Management Note (Signed)
Case Management Note  Patient Details  Name: [name] [name] MRN: [id number] Date of Birth: 12/21/1980  Subjective/Objective:                    Action/Plan:  Consult for Xarelto assistance. Per H and P patient was on Coumadin prior to admission.   Spoke with patient at bedside. He has used 30 day free card and co pay card for Xarelto . Per patient that is why he was changed to Coumadin. Coumadin is preferred medication per insurance.   Will discuss with PA.  Expected Discharge Date:                  Expected Discharge Plan:  Home/Self Care  In-House Referral:     Discharge planning Services  CM Consult, Medication Assistance  Post Acute Care Choice:  NA Choice offered to:  Patient  DME Arranged:  N/A DME Agency:  NA  HH Arranged:  NA HH Agency:  NA  Status of Service:  In process, will continue to follow  If discussed at Long Length of Stay Meetings, dates discussed:    Additional Comments:  Kingsley Plan, RN 04/23/2018, 12:11 PM

## 2018-04-23 NOTE — Progress Notes (Signed)
CC: Abdominal pain  Subjective: Still very tender left lower quadrant.  He is also very hungry.  I told him we give him some clears from the floor, but he is fairly anxious about taking anything.  Objective: Vital signs in last 24 hours: Temp:  [97.5 F (36.4 C)-98.3 F (36.8 C)] 97.5 F (36.4 C) (01/06 0458) Pulse Rate:  [55-82] 69 (01/06 0458) Resp:  [16-18] 18 (01/06 0458) BP: (107-140)/(68-78) 114/76 (01/06 0458) SpO2:  [95 %-97 %] 97 % (01/06 0458) Weight:  [113.4 kg] 113.4 kg (01/05 0939) Last BM Date: 04/22/18  2581 IV Urine x2 Afebrile vital signs are stable WBC 16.6>> 9.5 this a.m. H/H stable BMP shows creatinine 1.0, potassium 3.9 Admission CT 1/5: Cute diverticulitis distal descending colon with localized perforation extraluminal air and fluid measuring 2.8 cm. There is thickening of the wall the adjacent colon. There are several adjacent uninflamed diverticula.   Intake/Output from previous day: 01/05 0701 - 01/06 0700 In: 2581 [I.V.:1385.7; IV Piggyback:1195.3] Out: -  Intake/Output this shift: No intake/output data recorded.  General appearance: alert, cooperative and no distress Resp: clear to auscultation bilaterally GI: Soft, still very tender in the left lower quadrant  Lab Results:  Recent Labs    04/22/18 1015 04/23/18 0307  WBC 16.6* 9.5  HGB 15.7 14.3  HCT 46.5 40.9  PLT 250 230    BMET Recent Labs    04/22/18 1015 04/23/18 0307  NA 137 137  K 3.9 3.9  CL 104 106  CO2 24 PENDING  GLUCOSE 103* 102*  BUN 13 10  CREATININE 0.96 1.00  CALCIUM 9.6 8.9   PT/INR No results for input(s): LABPROT, INR in the last 72 hours.  Recent Labs  Lab 04/22/18 1015  AST 19  ALT 26  ALKPHOS 71  BILITOT 0.6  PROT 7.5  ALBUMIN 3.7     Lipase     Component Value Date/Time   LIPASE 49 04/22/2018 1015   Prior to Admission medications   He is supposed to be on Xarelto but has been out for 6 months secondary to insurance issues       Medications: . enoxaparin (LOVENOX) injection  1 mg/kg Subcutaneous Q12H  . pantoprazole (PROTONIX) IV  40 mg Intravenous QHS   . dextrose 5 % and 0.45 % NaCl with KCl 20 mEq/L 100 mL/hr at 04/23/18 0641  . piperacillin-tazobactam (ZOSYN)  IV Stopped (04/23/18 0421)   Anti-infectives (From admission, onward)   Start     Dose/Rate Route Frequency Ordered Stop   04/22/18 1630  piperacillin-tazobactam (ZOSYN) IVPB 3.375 g     3.375 g 100 mL/hr over 30 Minutes Intravenous  Once 04/22/18 1607 04/22/18 1843   04/22/18 1630  metroNIDAZOLE (FLAGYL) IVPB 500 mg  Status:  Discontinued     500 mg 100 mL/hr over 60 Minutes Intravenous Every 8 hours 04/22/18 1607 04/22/18 1609   04/22/18 1630  metroNIDAZOLE (FLAGYL) IVPB 500 mg     500 mg 100 mL/hr over 60 Minutes Intravenous  Once 04/22/18 1609 04/22/18 1742   04/22/18 1430  ciprofloxacin (CIPRO) IVPB 400 mg  Status:  Discontinued     400 mg 200 mL/hr over 60 Minutes Intravenous  Once 04/22/18 1417 04/22/18 1429   04/22/18 1430  metroNIDAZOLE (FLAGYL) IVPB 500 mg  Status:  Discontinued     500 mg 100 mL/hr over 60 Minutes Intravenous  Once 04/22/18 1417 04/22/18 1607   04/22/18 1430  piperacillin-tazobactam (ZOSYN) IVPB 3.375 g  Status:  Discontinued     3.375 g 100 mL/hr over 30 Minutes Intravenous  Once 04/22/18 1429 04/22/18 1606   04/22/18 0000  piperacillin-tazobactam (ZOSYN) IVPB 3.375 g     3.375 g 12.5 mL/hr over 240 Minutes Intravenous Every 8 hours 04/22/18 1609        Assessment/Plan Factor V mutation Hx of VTE IVC placement 11/2007 Hx right minithoracotomy with evacuation of hematoma PVD with right iliac artery angioplasty 06/2616  Diverticulitis with microperforation  FEN: IV fluids/n.p.o. ID: Flagyl times three 1/5; Zosyn 1/5 >> day 2 DVT: Therapeutic Lovenox Follow-up: To be determined  Plan: I am going to give him some clears from the floor continue current IV therapy.  Have him up and walking.  I will ask  case managers to see him and see if we can get him back on his anticoagulant.      LOS: 1 day    Lynise Porr 04/23/2018 [phone]

## 2018-04-24 NOTE — Progress Notes (Signed)
CC: Abdominal pain  Subjective: Pain is much improved and he is moving in the bed much easier today than yesterday.  He is very excited to hear he can have clear liquids.  Objective: Vital signs in last 24 hours: Temp:  [97.5 F (36.4 C)-98.2 F (36.8 C)] 97.5 F (36.4 C) (01/07 0500) Pulse Rate:  [48-50] 50 (01/07 0500) Resp:  [16-18] 16 (01/07 0500) BP: (113-126)/(70-79) 119/74 (01/07 0500) SpO2:  [97 %-99 %] 97 % (01/07 0500) Last BM Date: 04/22/18 30 p.o. 2300 IV Voided x9 Afebrile vital signs are stable BMP/ CBC yesterday were improved.   Intake/Output from previous day: 01/06 0701 - 01/07 0700 In: 2417.5 [P.O.:30; I.V.:2169.4; IV Piggyback:218.1] Out: -  Intake/Output this shift: No intake/output data recorded.  General appearance: alert, cooperative and no distress GI: Soft, tenderness appears to resolved.  He sitting up in bed moving in bed, moving to the side of the bed without any difficulty.  Positive bowel sounds.  Lab Results:  Recent Labs    04/22/18 1015 04/23/18 0307  WBC 16.6* 9.5  HGB 15.7 14.3  HCT 46.5 40.9  PLT 250 230    BMET Recent Labs    04/22/18 1015 04/23/18 0307  NA 137 137  K 3.9 3.9  CL 104 106  CO2 24 23  GLUCOSE 103* 102*  BUN 13 10  CREATININE 0.96 1.00  CALCIUM 9.6 8.9   PT/INR No results for input(s): LABPROT, INR in the last 72 hours.  Recent Labs  Lab 04/22/18 1015  AST 19  ALT 26  ALKPHOS 71  BILITOT 0.6  PROT 7.5  ALBUMIN 3.7     Lipase     Component Value Date/Time   LIPASE 49 04/22/2018 1015     Medications: . enoxaparin (LOVENOX) injection  1 mg/kg Subcutaneous Q12H  . pantoprazole (PROTONIX) IV  40 mg Intravenous QHS   Anti-infectives (From admission, onward)   Start     Dose/Rate Route Frequency Ordered Stop   04/22/18 1630  piperacillin-tazobactam (ZOSYN) IVPB 3.375 g     3.375 g 100 mL/hr over 30 Minutes Intravenous  Once 04/22/18 1607 04/22/18 1843   04/22/18 1630   metroNIDAZOLE (FLAGYL) IVPB 500 mg  Status:  Discontinued     500 mg 100 mL/hr over 60 Minutes Intravenous Every 8 hours 04/22/18 1607 04/22/18 1609   04/22/18 1630  metroNIDAZOLE (FLAGYL) IVPB 500 mg     500 mg 100 mL/hr over 60 Minutes Intravenous  Once 04/22/18 1609 04/22/18 1742   04/22/18 1430  ciprofloxacin (CIPRO) IVPB 400 mg  Status:  Discontinued     400 mg 200 mL/hr over 60 Minutes Intravenous  Once 04/22/18 1417 04/22/18 1429   04/22/18 1430  metroNIDAZOLE (FLAGYL) IVPB 500 mg  Status:  Discontinued     500 mg 100 mL/hr over 60 Minutes Intravenous  Once 04/22/18 1417 04/22/18 1607   04/22/18 1430  piperacillin-tazobactam (ZOSYN) IVPB 3.375 g  Status:  Discontinued     3.375 g 100 mL/hr over 30 Minutes Intravenous  Once 04/22/18 1429 04/22/18 1606   04/22/18 0000  piperacillin-tazobactam (ZOSYN) IVPB 3.375 g     3.375 g 12.5 mL/hr over 240 Minutes Intravenous Every 8 hours 04/22/18 1609        Assessment/Plan Factor V mutation Hx of VTE IVC placement 11/2007 Hx right minithoracotomy with evacuation of hematoma PVD with right iliac artery angioplasty 06/2616  Diverticulitis with microperforation  FEN: IV fluids/n.p.o. ID: Flagyl times three 1/5; Zosyn  1/5 >> day 3 DVT: Therapeutic Lovenox Follow-up: To be determined  Plan: Clear liquids and continue antibiotics.      LOS: 2 days    Kassidie Hendriks 04/24/2018 [phone]

## 2018-04-24 NOTE — Plan of Care (Signed)
  Problem: Education: Goal: Knowledge of General Education information will improve Description: Including pain rating scale, medication(s)/side effects and non-pharmacologic comfort measures Outcome: Progressing   Problem: Health Behavior/Discharge Planning: Goal: Ability to manage health-related needs will improve Outcome: Progressing   Problem: Pain Managment: Goal: General experience of comfort will improve Outcome: Progressing   

## 2018-04-25 DIAGNOSIS — D6851 Activated protein C resistance: Secondary | ICD-10-CM | POA: Diagnosis present

## 2018-04-25 LAB — BASIC METABOLIC PANEL
Anion gap: 8 (ref 5–15)
BUN: 5 mg/dL — ABNORMAL LOW (ref 6–20)
CALCIUM: 9.2 mg/dL (ref 8.9–10.3)
CO2: 23 mmol/L (ref 22–32)
Chloride: 106 mmol/L (ref 98–111)
Creatinine, Ser: 1.11 mg/dL (ref 0.61–1.24)
GFR calc Af Amer: >60 mL/min (ref >60)
GFR calc non Af Amer: >60 mL/min (ref >60)
Glucose, Bld: 98 mg/dL (ref 70–99)
Potassium: 3.9 mmol/L (ref 3.5–5.1)
Sodium: 137 mmol/L (ref 135–145)

## 2018-04-25 LAB — CBC
HCT: 44.5 % (ref 39.0–52.0)
Hemoglobin: 15.6 g/dL (ref 13.0–17.0)
MCH: 31.8 pg (ref 26.0–34.0)
MCHC: 35.1 g/dL (ref 30.0–36.0)
MCV: 90.8 fL (ref 80.0–100.0)
PLATELETS: 250 10*3/uL (ref 150–400)
RBC: 4.9 MIL/uL (ref 4.22–5.81)
RDW: 12.4 % (ref 11.5–15.5)
WBC: 7.6 10*3/uL (ref 4.0–10.5)
nRBC: 0 % (ref 0.0–0.2)

## 2018-04-25 MED ORDER — COUMADIN BOOK
Freq: Once | Status: AC
  Administered 2018-04-25: 10:00:00
  Filled 2018-04-25: qty 1

## 2018-04-25 MED ORDER — WARFARIN SODIUM 5 MG PO TABS
20.0000 mg | ORAL_TABLET | Freq: Once | ORAL | Status: AC
  Administered 2018-04-25: 20 mg via ORAL
  Filled 2018-04-25: qty 4

## 2018-04-25 MED ORDER — PANTOPRAZOLE SODIUM 40 MG PO TBEC
40.0000 mg | DELAYED_RELEASE_TABLET | Freq: Every day | ORAL | Status: DC
  Administered 2018-04-25: 40 mg via ORAL
  Filled 2018-04-25: qty 1

## 2018-04-25 MED ORDER — WARFARIN - PHARMACIST DOSING INPATIENT: Freq: Every day | Status: DC

## 2018-04-25 NOTE — Discharge Summary (Signed)
Physician Discharge Summary  Patient ID: [name] [name] MRN: [id number] DOB/AGE: 40/23/1982 40 y.o.  Admit date: 04/22/2018 Discharge date: 04/26/2018  Admission Diagnoses:  Diverticulitis with microperforation Factor V mutation History of multiple venous thrombus events S/p IVC placement 11/2007 History of anticoagulation -off secondary to insurance issues    Discharge Diagnoses:  Same  Active Problems:   PRIMARY HYPERCOAGULABLE STATE   Diverticulitis of colon with perforation   Factor V Leiden, prothrombin gene mutation (HCC)   PROCEDURES: None  Hospital Course:  38 yo wm with Factor V mutation, h/o multiple vtes, s/p IVC filter comes in to ED with c/o LLQ pain which started late Friday or early Saturday. Had similar pain a few years ago but not as severe. States he was treated for diverticulitis with oral abx. No prior colonoscopy. No melena/hematochezia; no stool caliber change. Denies family hx colorectal cancer. No f/c/n/v  Hasn't been on xarelto for 6 months - insurance issues. (Patient reports being on Coumadin 25 mg alternating with 20 mg every other day)   he was previously followed by [facility] Dr. Alexandria Lodge.   Patient was seen in the ED and admitted by Dr. Andrey Campanile.  CT of the abdomen on 04/22/2018, shows acute diverticulitis in the descending colon with a localized perforation with extraluminal air and fluid attenuation measuring 2.8 cm.  This was not amenable to drainage.  Is a stable vena cava filter in place 3 cm below the right renal vein.  He was placed on IV antibiotics and made n.p.o.  After 48 hours of bowel rest, IV fluids, IV antibiotics his symptoms improved he was placed on clear liquids.  His diet was advanced next day.  Medicine was contacted to initiate appropriate anticoagulation therapy and follow-up for the patient.  He was discharged home on 10 more days of oral antibiotics.  On day of discharge he was tolerating soft diet, and is completely pain-free.   All function have returned to essentially normal.  He was discharged home on 10 more days of Augmentin. He will follow-up with his primary care along with the [facility].  He has been instructed to obtain a colonoscopy in about 6 weeks.  He can follow-up with our office as needed.  Condition on discharge: Improved.  CBC Latest Ref Rng & Units 04/26/2018 04/25/2018 04/23/2018  WBC 4.0 - 10.5 K/uL 8.2 7.6 9.5  Hemoglobin 13.0 - 17.0 g/dL 16.8 37.2 90.2  Hematocrit 39.0 - 52.0 % 45.6 44.5 40.9  Platelets 150 - 400 K/uL 271 250 230   CMP Latest Ref Rng & Units 04/25/2018 04/23/2018 04/22/2018  Glucose 70 - 99 mg/dL 98 111(B) 520(E)  BUN 6 - 20 mg/dL 5(L) 10 13  Creatinine 0.61 - 1.24 mg/dL 0.22 3.36 1.22  Sodium 135 - 145 mmol/L 137 137 137  Potassium 3.5 - 5.1 mmol/L 3.9 3.9 3.9  Chloride 98 - 111 mmol/L 106 106 104  CO2 22 - 32 mmol/L 23 23 24   Calcium 8.9 - 10.3 mg/dL 9.2 8.9 9.6  Total Protein 6.5 - 8.1 g/dL - - 7.5  Total Bilirubin 0.3 - 1.2 mg/dL - - 0.6  Alkaline Phos 38 - 126 U/L - - 71  AST 15 - 41 U/L - - 19  ALT 0 - 44 U/L - - 26   Admission CT scan 04/22/2018: Acute diverticulitis in the distal descending colon.  There is a localized perforation with extraluminal air and fluid attenuation surrounding inflammation along the anterior margin of  the colon.  The air-fluid level measured 2.8 cm and was too small for IR intervention.  No other abnormalities were noted.   Disposition: Discharge disposition: 01-Home or Self Care        Allergies as of 04/26/2018   No Known Allergies     Medication List    TAKE these medications   acetaminophen 325 MG tablet Commonly known as:  TYLENOL Take 2 tablets (650 mg total) by mouth every 6 (six) hours as needed for mild pain (or temp > 100).   amoxicillin-clavulanate 875-125 MG tablet Commonly known as:  AUGMENTIN Take 1 tablet by mouth every 12 (twelve) hours for 10 days.   enoxaparin 120 MG/0.8ML injection Commonly  known as:  LOVENOX Inject 0.76 mLs (115 mg total) into the skin every 12 (twelve) hours.   saccharomyces boulardii 250 MG capsule Commonly known as:  FLORASTOR Is a probiotic you can get it with the stool products and any drugstore without a prescription.  Take it for the next month and then you should be done with it.   warfarin 10 MG tablet Commonly known as:  COUMADIN Take as directed. If you are unsure how to take this medication, talk to your nurse or doctor. Original instructions:  Take 2 tablets (20 mg total) by mouth daily.      Follow-up Information    Levora Dredge, MD Follow up.   Specialty:  Internal Medicine Why:  Need to follow-up with them and let them follow your Coumadin and your diverticulitis.  You need to be scheduled for a colonoscopy in about 6 to 7 weeks. Appointment April 30, 2018 at 0945 Contact information: [address] [place] [address] [phone]        [facility] [place] Follow up.   Specialty:  General Surgery Why:  He can follow-up with our office as needed.  Dr. Doylene Canard was responsible for your care from our service. Contact information: [address] [place] [address] [phone]           Signed: Sherrie George 04/26/2018, 1:35 PM

## 2018-04-25 NOTE — Discharge Instructions (Addendum)
Information on my medicine - Coumadin   (Warfarin)  This medication education was reviewed with me or my healthcare representative as part of my discharge preparation.  Why was Coumadin prescribed for you? Coumadin was prescribed for you because you have a blood clot or a medical condition that can cause an increased risk of forming blood clots. Blood clots can cause serious health problems by blocking the flow of blood to the heart, lung, or brain. Coumadin can prevent harmful blood clots from forming. As a reminder your indication for Coumadin is:   Blood Clotting Disorder hx Factor V Leiden  What test will check on my response to Coumadin? While on Coumadin (warfarin) you will need to have an INR test regularly to ensure that your dose is keeping you in the desired range. The INR (international normalized ratio) number is calculated from the result of the laboratory test called prothrombin time (PT).  If an INR APPOINTMENT HAS NOT ALREADY BEEN MADE FOR YOU please schedule an appointment to have this lab work done by your health care provider within 7 days. Your INR goal is usually a number between:  2 to 3 or your provider may give you a more narrow range like 2-2.5.  Ask your health care provider during an office visit what your goal INR is.  What  do you need to  know  About  COUMADIN? Take Coumadin (warfarin) exactly as prescribed by your healthcare provider about the same time each day.  DO NOT stop taking without talking to the doctor who prescribed the medication.  Stopping without other blood clot prevention medication to take the place of Coumadin may increase your risk of developing a new clot or stroke.  Get refills before you run out.  What do you do if you miss a dose? If you miss a dose, take it as soon as you remember on the same day then continue your regularly scheduled regimen the next day.  Do not take two doses of Coumadin at the same time.  Important Safety Information A  possible side effect of Coumadin (Warfarin) is an increased risk of bleeding. You should call your healthcare provider right away if you experience any of the following: ? Bleeding from an injury or your nose that does not stop. ? Unusual colored urine (red or dark brown) or unusual colored stools (red or black). ? Unusual bruising for unknown reasons. ? A serious fall or if you hit your head (even if there is no bleeding).  Some foods or medicines interact with Coumadin (warfarin) and might alter your response to warfarin. To help avoid this: ? Eat a balanced diet, maintaining a consistent amount of Vitamin K. ? Notify your provider about major diet changes you plan to make. ? Avoid alcohol or limit your intake to 1 drink for women and 2 drinks for men per day. (1 drink is 5 oz. wine, 12 oz. beer, or 1.5 oz. liquor.)  Make sure that ANY health care provider who prescribes medication for you knows that you are taking Coumadin (warfarin).  Also make sure the healthcare provider who is monitoring your Coumadin knows when you have started a new medication including herbals and non-prescription products.  Coumadin (Warfarin)  Major Drug Interactions  Increased Warfarin Effect Decreased Warfarin Effect  Alcohol (large quantities) Antibiotics (esp. Septra/Bactrim, Flagyl, Cipro) Amiodarone (Cordarone) Aspirin (ASA) Cimetidine (Tagamet) Megestrol (Megace) NSAIDs (ibuprofen, naproxen, etc.) Piroxicam (Feldene) Propafenone (Rythmol SR) Propranolol (Inderal) Isoniazid (INH) Posaconazole (Noxafil) Barbiturates (Phenobarbital) Carbamazepine (  Tegretol) Chlordiazepoxide (Librium) Cholestyramine (Questran) Griseofulvin Oral Contraceptives Rifampin Sucralfate (Carafate) Vitamin K   Coumadin (Warfarin) Major Herbal Interactions  Increased Warfarin Effect Decreased Warfarin Effect  Garlic Ginseng Ginkgo biloba Coenzyme Q10 Green tea St. Johns wort    Coumadin (Warfarin) FOOD  Interactions  Eat a consistent number of servings per week of foods HIGH in Vitamin K (1 serving =  cup)  Collards (cooked, or boiled & drained) Kale (cooked, or boiled & drained) Mustard greens (cooked, or boiled & drained) Parsley *serving size only =  cup Spinach (cooked, or boiled & drained) Swiss chard (cooked, or boiled & drained) Turnip greens (cooked, or boiled & drained)  Eat a consistent number of servings per week of foods MEDIUM-HIGH in Vitamin K (1 serving = 1 cup)  Asparagus (cooked, or boiled & drained) Broccoli (cooked, boiled & drained, or raw & chopped) Brussel sprouts (cooked, or boiled & drained) *serving size only =  cup Lettuce, raw (green leaf, endive, romaine) Spinach, raw Turnip greens, raw & chopped   These websites have more information on Coumadin (warfarin):  http://www.king-russell.com/; https://www.hines.net/;  Enoxaparin injection What is this medicine? ENOXAPARIN (ee nox a PA rin) is used after knee, hip, or abdominal surgeries to prevent blood clotting. It is also used to treat existing blood clots in the lungs or in the veins. This medicine may be used for other purposes; ask your health care provider or pharmacist if you have questions. COMMON BRAND NAME(S): Lovenox What should I tell my health care provider before I take this medicine? They need to know if you have any of these conditions: -bleeding disorders, hemorrhage, or hemophilia -infection of the heart or heart valves -kidney or liver disease -previous stroke -prosthetic heart valve -recent surgery or delivery of a baby -ulcer in the stomach or intestine, diverticulitis, or other bowel disease -an unusual or allergic reaction to enoxaparin, heparin, pork or pork products, other medicines, foods, dyes, or preservatives -pregnant or trying to get pregnant -breast-feeding How should I use this medicine? This medicine is for injection under the skin. It is usually given by a  health-care professional. You or a family member may be trained on how to give the injections. If you are to give yourself injections, make sure you understand how to use the syringe, measure the dose if necessary, and give the injection. To avoid bruising, do not rub the site where this medicine has been injected. Do not take your medicine more often than directed. Do not stop taking except on the advice of your doctor or health care professional. Make sure you receive a puncture-resistant container to dispose of the needles and syringes once you have finished with them. Do not reuse these items. Return the container to your doctor or health care professional for proper disposal. Talk to your pediatrician regarding the use of this medicine in children. Special care may be needed. Overdosage: If you think you have taken too much of this medicine contact a poison control center or emergency room at once. NOTE: This medicine is only for you. Do not share this medicine with others. What if I miss a dose? If you miss a dose, take it as soon as you can. If it is almost time for your next dose, take only that dose. Do not take double or extra doses. What may interact with this medicine? -aspirin and aspirin-like medicines -certain medicines that treat or prevent blood clots -dipyridamole -NSAIDs, medicines for pain and inflammation, like ibuprofen or naproxen This  list may not describe all possible interactions. Give your health care provider a list of all the medicines, herbs, non-prescription drugs, or dietary supplements you use. Also tell them if you smoke, drink alcohol, or use illegal drugs. Some items may interact with your medicine. What should I watch for while using this medicine? Visit your healthcare professional for regular checks on your progress. You may need blood work done while you are taking this medicine. Your condition will be monitored carefully while you are receiving this medicine. It  is important not to miss any appointments. If you are going to need surgery or other procedure, tell your healthcare professional that you are using this medicine. Using this medicine for a long time may weaken your bones and increase the risk of bone fractures. Avoid sports and activities that might cause injury while you are using this medicine. Severe falls or injuries can cause unseen bleeding. Be careful when using sharp tools or knives. Consider using an Neurosurgeonelectric razor. Take special care brushing or flossing your teeth. Report any injuries, bruising, or red spots on the skin to your healthcare professional. Wear a medical ID bracelet or chain. Carry a card that describes your disease and details of your medicine and dosage times. What side effects may I notice from receiving this medicine? Side effects that you should report to your doctor or health care professional as soon as possible: -allergic reactions like skin rash, itching or hives, swelling of the face, lips, or tongue -bone pain -signs and symptoms of bleeding such as bloody or black, tarry stools; red or dark-brown urine; spitting up blood or brown material that looks like coffee grounds; red spots on the skin; unusual bruising or bleeding from the eye, gums, or nose -signs and symptoms of a blood clot such as chest pain; shortness of breath; pain, swelling, or warmth in the leg -signs and symptoms of a stroke such as changes in vision; confusion; trouble speaking or understanding; severe headaches; sudden numbness or weakness of the face, arm or leg; trouble walking; dizziness; loss of coordination Side effects that usually do not require medical attention (report to your doctor or health care professional if they continue or are bothersome): -hair loss -pain, redness, or irritation at site where injected This list may not describe all possible side effects. Call your doctor for medical advice about side effects. You may report side  effects to FDA at 1-800-FDA-1088. Where should I keep my medicine? Keep out of the reach of children. Store at room temperature between 15 and 30 degrees C (59 and 86 degrees F). Do not freeze. If your injections have been specially prepared, you may need to store them in the refrigerator. Ask your pharmacist. Throw away any unused medicine after the expiration date. NOTE: This sheet is a summary. It may not cover all possible information. If you have questions about this medicine, talk to your doctor, pharmacist, or health care provider.  2019 Elsevier/Gold Standard (2017-03-30 11:25:34)     Diverticulitis You will need to have a colonoscopy in about 6-7 weeks. The Primary care doctors can arrange this for you.  Diverticulitis is when small pockets in your large intestine (colon) get infected or swollen. This causes stomach pain and watery poop (diarrhea). These pouches are called diverticula. They form in people who have a condition called diverticulosis. Follow these instructions at home: Medicines  Take over-the-counter and prescription medicines only as told by your doctor. These include: ? Antibiotics. ? Pain medicines. ? Fiber  pills. ? Probiotics. ? Stool softeners.  Do not drive or use heavy machinery while taking prescription pain medicine.  If you were prescribed an antibiotic, take it as told. Do not stop taking it even if you feel better. General instructions   Follow a diet as told by your doctor.  When you feel better, your doctor may tell you to change your diet. You may need to eat a lot of fiber. Fiber makes it easier to poop (have bowel movements). Healthy foods with fiber include: ? Berries. ? Beans. ? Lentils. ? Green vegetables.  Exercise 3 or more times a week. Aim for 30 minutes each time. Exercise enough to sweat and make your heart beat faster.  Keep all follow-up visits as told. This is important. You may need to have an exam of the large intestine.  This is called a colonoscopy. Contact a doctor if:  Your pain does not get better.  You have a hard time eating or drinking.  You are not pooping like normal. Get help right away if:  Your pain gets worse.  Your problems do not get better.  Your problems get worse very fast.  You have a fever.  You throw up (vomit) more than one time.  You have poop that is: ? Bloody. ? Black. ? Tarry. Summary  Diverticulitis is when small pockets in your large intestine (colon) get infected or swollen.  Take medicines only as told by your doctor.  Follow a diet as told by your doctor. This information is not intended to replace advice given to you by your health care provider. Make sure you discuss any questions you have with your health care provider. Document Released: 09/21/2007 Document Revised: 04/21/2016 Document Reviewed: 04/21/2016 Elsevier Interactive Patient Education  2019 [employer]

## 2018-04-25 NOTE — Consult Note (Signed)
Medical Consultation   [name] [name]  FOY:[id number]  DOB: 08-03-1980  DOA: 04/22/2018  PCP: [facility] [place]) lost to follow-up when he lost his insurance   Outpatient Specialists: Dr. Randie Heinz from vascular medicine   Requesting physician: Dr. Andrey Campanile  Reason for consultation: 5 Leiden deficiency  History of Present Illness: [name] [name] is a delightful 38 y.o. male medical history significant for factor V Leiden deficiency with DVTs, myocardial infarction, gastroesophageal reflux disease, pulmonary thromboembolism, pneumothorax requiring chest tube placement, and chronic venous stasis who presented to the hospital with abdominal pain and was to have diverticulitis with microperforation.  He was admitted into the hospital under the service of Dr. Andrey Campanile from general surgery and has been slowly improving without requirement for operative management. Approximately 2 years ago the patient lost his insurance and he has been off of his anticoagulation due to difficulty obtaining funds.  Previously he had been followed in internal medicine clinic and Coumadin clinic.  Was last seen in internal medicine clinic on October 20, 2016.  Then he has been off of his warfarin.  He now has fortunately regained insurance and is now able to afford his warfarin.  I offered him the option of NOAC due to co-pay issues prefers to use warfarin.  Patient does require lifelong anticoagulation and we will start warfarin with a Lovenox bridge beginning today.     Review of Systems:   As per HPI otherwise 10 point review of systems negative.    Past Medical History: Past Medical History:  Diagnosis Date  . Anxiety and depression   . DVT (deep venous thrombosis) (HCC)    Pt unsure of date   . Factor V Leiden mutation (HCC)    With resultant hypercoaguable state, Hx of DVT, PE, SP IVC filter placement, on chronic coumadin and requires high  doses to maintine IRN 2-3.  Marland Kitchen GERD (gastroesophageal reflux disease)    history of  . Myocardial infarction (HCC)   . PE (pulmonary thromboembolism) (HCC)    unsure of date  . Pneumonia   . Pneumothorax 8/11   HX of, requiring chest tube/hospitalization.   . Venous stasis    bilateral LE.    Past Surgical History: Past Surgical History:  Procedure Laterality Date  . ANGIOPLASTY ILLIAC ARTERY Right 07/11/2016   Procedure: balloon ANGIOPLASTY of Inferior vena cava, right external iliac vein, right common femoral vein;  Surgeon: Maeola Harman, MD;  Location: [facility] OR;  Service: Vascular;  Laterality: Right;  . INTRAVASCULAR ULTRASOUND/IVUS Right 07/11/2016   Procedure: INTRAVASCULAR ULTRASOUND/IVUS of right popliteal vein, right common femoral vein, right femoral vein, right exterternal iliac vein, and right internal iliac vein;  Surgeon: Maeola Harman, MD;  Location: [facility] OR;  Service: Vascular;  Laterality: Right;  . LOWER EXTREMITY ANGIOGRAPHY N/A 07/12/2016   Procedure: LYSIS RECHECK;  Surgeon: Nada Libman, MD;  Location: [facility] INVASIVE CV LAB;  Service: Cardiovascular;  Laterality: N/A;  . placement of IVC filter  8/09  . Placement of large-bore right chest tube  8/11  . right minithoracotomy with evacuation of hematoma  8/09  . ULTRASOUND GUIDANCE FOR VASCULAR ACCESS Right 07/11/2016   Procedure: ULTRASOUND GUIDANCE FOR VASCULAR ACCESS;  Surgeon: Maeola Harman, MD;  Location: [facility] OR;  Service: Vascular;  Laterality: Right;     Allergies:  No Known Allergies   Social History:  reports that he has quit smoking. His smoking use included cigarettes. He started smoking about 21 months ago. He has a 20.00 pack-year smoking history. He has quit using smokeless tobacco. He reports current drug use. Frequency: 1.00 time per week. Drug: Marijuana. He reports that he does not drink alcohol.   Family History: No family history on file.  Family history: He has a  son who is 40 years old with no health problems His mother left when he was 3 and does not know his father's health history.  He has a half sister but does not know of any health problems related to her.   Physical Exam: Vitals:   04/24/18 0500 04/24/18 1418 04/24/18 2146 04/25/18 0530  BP: 119/74 119/87 (!) 129/92 (!) 142/89  Pulse: (!) 50 (!) 58 (!) 51 61  Resp: 16 16 18 18   Temp: (!) 97.5 F (36.4 C) 97.9 F (36.6 C) 97.6 F (36.4 C) (!) 97.5 F (36.4 C)  TempSrc: Oral Oral Oral Oral  SpO2: 97% 99% 100% 98%  Weight:      Height:        Constitutional: Alert and awake, oriented x3, not in any acute distress. Eyes: PERLA, EOMI, irises appear normal, anicteric sclera,  ENMT: external ears and nose appear normal, hearing is normal            Lips appears normal, oropharynx mucosa, tongue, posterior pharynx appear normal  Neck: neck appears normal, no masses, normal ROM, no thyromegaly, no JVD  CVS: S1-S2 clear, no murmur rubs or gallops, no LE edema, normal pedal pulses  Respiratory:  clear to auscultation bilaterally, no wheezing, rales or rhonchi. Respiratory effort normal. No accessory muscle use.  Abdomen: soft nontender, nondistended, normal bowel sounds, no hepatosplenomegaly, no hernias  Musculoskeletal: : no cyanosis, clubbing or edema noted bilaterally, good tone no arthropathy Neuro: Cranial nerves II-XII intact, strength, sensation, reflexes Psych: judgement and insight appear normal, stable mood and affect, mental status Skin: no rashes or lesions or ulcers, no induration or nodules    Data reviewed:  I have personally reviewed following labs and imaging studies Labs:  CBC: Recent Labs  Lab 04/22/18 1015 04/23/18 0307 04/25/18 0244  WBC 16.6* 9.5 7.6  HGB 15.7 14.3 15.6  HCT 46.5 40.9 44.5  MCV 93.4 93.2 90.8  PLT 250 230 250    Basic Metabolic Panel: Recent Labs  Lab 04/22/18 1015 04/23/18 0307 04/25/18 0244  NA 137 137 137  K 3.9 3.9 3.9  CL 104  106 106  CO2 24 23 23   GLUCOSE 103* 102* 98  BUN 13 10 5*  CREATININE 0.96 1.00 1.11  CALCIUM 9.6 8.9 9.2   GFR Estimated Creatinine Clearance: 116.1 mL/min (by C-G formula based on SCr of 1.11 mg/dL). Liver Function Tests: Recent Labs  Lab 04/22/18 1015  AST 19  ALT 26  ALKPHOS 71  BILITOT 0.6  PROT 7.5  ALBUMIN 3.7   Recent Labs  Lab 04/22/18 1015  LIPASE 49   No results for input(s): AMMONIA in the last 168 hours. Coagulation profile No results for input(s): INR, PROTIME in the last 168 hours.  Cardiac Enzymes: No results for input(s): CKTOTAL, CKMB, CKMBINDEX, TROPONINI in the last 168 hours. BNP: Invalid input(s): POCBNP CBG: No results for input(s): GLUCAP in the last 168 hours. D-Dimer No results for input(s): DDIMER in the last 72 hours. Hgb A1c No results for input(s): HGBA1C in the last 72 hours. Lipid Profile No results for input(s): CHOL,  HDL, LDLCALC, TRIG, CHOLHDL, LDLDIRECT in the last 72 hours. Thyroid function studies No results for input(s): TSH, T4TOTAL, T3FREE, THYROIDAB in the last 72 hours.  Invalid input(s): FREET3 Anemia work up No results for input(s): VITAMINB12, FOLATE, FERRITIN, TIBC, IRON, RETICCTPCT in the last 72 hours. Urinalysis    Component Value Date/Time   COLORURINE YELLOW 04/22/2018 1141   APPEARANCEUR CLEAR 04/22/2018 1141   LABSPEC 1.018 04/22/2018 1141   PHURINE 5.0 04/22/2018 1141   GLUCOSEU NEGATIVE 04/22/2018 1141   HGBUR SMALL (A) 04/22/2018 1141   BILIRUBINUR NEGATIVE 04/22/2018 1141   KETONESUR NEGATIVE 04/22/2018 1141   PROTEINUR NEGATIVE 04/22/2018 1141   UROBILINOGEN 1.0 08/22/2012 2245   NITRITE NEGATIVE 04/22/2018 1141   LEUKOCYTESUR NEGATIVE 04/22/2018 1141     Microbiology No results found for this or any previous visit (from the past 240 hour(s)).     Inpatient Medications:   Scheduled Meds: . enoxaparin (LOVENOX) injection  1 mg/kg Subcutaneous Q12H  . pantoprazole (PROTONIX) IV  40 mg  Intravenous QHS   Continuous Infusions: . piperacillin-tazobactam (ZOSYN)  IV 3.375 g (04/25/18 0755)     Radiological Exams on Admission: No results found.  Impression/Recommendations Active Problems:   Factor V Leiden, prothrombin gene mutation (HCC)   PRIMARY HYPERCOAGULABLE STATE   Diverticulitis of colon with perforation   1.  Factor V Leiden prothrombin gene mutation: Patient will need to restart Lovenox and bridged over to Coumadin.  Recently he was on 20 mg of warfarin alternating with 25 mg.  Not sure if this will be his new requirement for him going to ask pharmacy to manage his warfarin.  2.  Primary hypercoagulable state: Patient will need lifelong anticoagulation.  If he is discharged prior to being therapeutic on warfarin will need Lovenox bridging.  3.  Follow-up: Internal Medicine Clinic  Thank you for this consultation.  [facility] team will follow the patient with you.   Time Spent: 45 minutes  Lahoma Crockerheresa C  M.D., FACP [facility] 04/25/2018, 8:31 AM

## 2018-04-25 NOTE — Progress Notes (Signed)
ANTICOAGULATION CONSULT NOTE  Pharmacy Consult:  Lovenox / Coumadin Indication: hx VTE and Factor V Leiden deficiency  No Known Allergies  Patient Measurements: Height: 6' (182.9 cm) Weight: 250 lb (113.4 kg) IBW/kg (Calculated) : 77.6  Vital Signs: Temp: 97.5 F (36.4 C) (01/08 0530) Temp Source: Oral (01/08 0530) BP: 142/89 (01/08 0530) Pulse Rate: 61 (01/08 0530)  Labs: Recent Labs    04/22/18 1015 04/23/18 0307 04/25/18 0244  HGB 15.7 14.3 15.6  HCT 46.5 40.9 44.5  PLT 250 230 250  CREATININE 0.96 1.00 1.11    Estimated Creatinine Clearance: 116.1 mL/min (by C-G formula based on SCr of 1.11 mg/dL).   Assessment: 70 YOM with history of VTE and Factor V Leiden deficiency who presented to the ED with lower abdominal pain, found to have diverticulitis with perforation.  Patient was placed on Lovenox.  No surgery planned so Pharmacy consulted add Coumadin.  Patient was previously on a NoAC, then switched to Coumadin due to cost.  He was lost to follow up given loss of insurance.  Based on record from 2018, patient's Coumadin requirement was as high as 140mg  per week.  No interacting med at that time based on old med history.  Renal function stable; no bleeding reported.  Goal of Therapy:  Anti-Xa level 0.6-1 units/ml 4hrs after LMWH dose given  INR 2 - 3 Monitor platelets by anticoagulation protocol: Yes   Plan:  Coumadin 20 mg PO today Continue Lovenox 115 mg SQ Q12H Daily PT / INR, CBC Q72H while on Lovenox   [name] [name] D. Laney Potash, PharmD, BCPS, BCCCP 04/25/2018, 8:45 AM

## 2018-04-25 NOTE — Progress Notes (Signed)
CC: Abdominal pain  Subjective: He is doing quite well from his diverticulitis.  No pain tolerating clears well.  His blood pressure is somewhat elevated, but has not been on IV fluids for several days.   Objective: Vital signs in last 24 hours: Temp:  [97.5 F (36.4 C)-97.9 F (36.6 C)] 97.5 F (36.4 C) (01/08 0530) Pulse Rate:  [51-61] 61 (01/08 0530) Resp:  [16-18] 18 (01/08 0530) BP: (119-142)/(87-92) 142/89 (01/08 0530) SpO2:  [98 %-100 %] 98 % (01/08 0530) Last BM Date: 04/24/18 840 PO Nothing else recorded Afebrile, VSS WBC 7.6/BMP is normal also Intake/Output from previous day: 01/07 0701 - 01/08 0700 In: 840 [P.O.:840] Out: -  Intake/Output this shift: No intake/output data recorded.  General appearance: alert, cooperative and no distress Resp: clear to auscultation bilaterally GI: soft, non-tender; bowel sounds normal; no masses,  no organomegaly  Lab Results:  Recent Labs    04/23/18 0307 04/25/18 0244  WBC 9.5 7.6  HGB 14.3 15.6  HCT 40.9 44.5  PLT 230 250    BMET Recent Labs    04/23/18 0307 04/25/18 0244  NA 137 137  K 3.9 3.9  CL 106 106  CO2 23 23  GLUCOSE 102* 98  BUN 10 5*  CREATININE 1.00 1.11  CALCIUM 8.9 9.2   PT/INR No results for input(s): LABPROT, INR in the last 72 hours.  Recent Labs  Lab 04/22/18 1015  AST 19  ALT 26  ALKPHOS 71  BILITOT 0.6  PROT 7.5  ALBUMIN 3.7     Lipase     Component Value Date/Time   LIPASE 49 04/22/2018 1015   Prior to Admission medications   Not on File      Medications: . enoxaparin (LOVENOX) injection  1 mg/kg Subcutaneous Q12H  . pantoprazole (PROTONIX) IV  40 mg Intravenous QHS   . dextrose 5 % and 0.45 % NaCl with KCl 20 mEq/L 100 mL/hr at 04/24/18 2024  . piperacillin-tazobactam (ZOSYN)  IV 3.375 g (04/25/18 0013)   Anti-infectives (From admission, onward)   Start     Dose/Rate Route Frequency Ordered Stop   04/22/18 1630  piperacillin-tazobactam (ZOSYN) IVPB 3.375  g     3.375 g 100 mL/hr over 30 Minutes Intravenous  Once 04/22/18 1607 04/22/18 1843   04/22/18 1630  metroNIDAZOLE (FLAGYL) IVPB 500 mg  Status:  Discontinued     500 mg 100 mL/hr over 60 Minutes Intravenous Every 8 hours 04/22/18 1607 04/22/18 1609   04/22/18 1630  metroNIDAZOLE (FLAGYL) IVPB 500 mg     500 mg 100 mL/hr over 60 Minutes Intravenous  Once 04/22/18 1609 04/22/18 1742   04/22/18 1430  ciprofloxacin (CIPRO) IVPB 400 mg  Status:  Discontinued     400 mg 200 mL/hr over 60 Minutes Intravenous  Once 04/22/18 1417 04/22/18 1429   04/22/18 1430  metroNIDAZOLE (FLAGYL) IVPB 500 mg  Status:  Discontinued     500 mg 100 mL/hr over 60 Minutes Intravenous  Once 04/22/18 1417 04/22/18 1607   04/22/18 1430  piperacillin-tazobactam (ZOSYN) IVPB 3.375 g  Status:  Discontinued     3.375 g 100 mL/hr over 30 Minutes Intravenous  Once 04/22/18 1429 04/22/18 1606   04/22/18 0000  piperacillin-tazobactam (ZOSYN) IVPB 3.375 g     3.375 g 12.5 mL/hr over 240 Minutes Intravenous Every 8 hours 04/22/18 1609        Assessment/Plan Factor V mutation Hx of VTE IVC placement 11/2007 Hx right minithoracotomy with evacuation  of hematoma PVD with right iliac artery angioplasty 06/2616  Diverticulitis with microperforation  FEN: IV fluids/n.p.o. ID: Flagyl times three 1/5;Zosyn 1/5>>day 4 DVT: Therapeutic Lovenox Follow-up: To be determined   Plan: Advance diet today, continue IV antibiotics today.  Saline lock IV.  He has a factor V mutation and is supposed to be on anticoagulation.  He has been off anticoagulation for some time because of insurance issues.  He was on Xarelto at one point but they switched him over to Coumadin and he reports being followed by Dr. Juleen Starr  in the Coumadin clinic here.  He has no primary care, and he is not been followed for this since he lost his insurance.  He now has insurance.  He reports his Coumadin dose was 20 mg alternating with 25 mg every other day.   We will asked Medicine to see and evaluate for chronic anticoagulation therapy.   LOS: 3 days    [name] [name] 04/25/2018 [phone]

## 2018-04-26 DIAGNOSIS — D6851 Activated protein C resistance: Secondary | ICD-10-CM

## 2018-04-26 LAB — CBC
HCT: 45.6 % (ref 39.0–52.0)
HEMOGLOBIN: 16 g/dL (ref 13.0–17.0)
MCH: 32.1 pg (ref 26.0–34.0)
MCHC: 35.1 g/dL (ref 30.0–36.0)
MCV: 91.6 fL (ref 80.0–100.0)
Platelets: 271 10*3/uL (ref 150–400)
RBC: 4.98 MIL/uL (ref 4.22–5.81)
RDW: 12.5 % (ref 11.5–15.5)
WBC: 8.2 10*3/uL (ref 4.0–10.5)
nRBC: 0 % (ref 0.0–0.2)

## 2018-04-26 LAB — PROTIME-INR
INR: 1
Prothrombin Time: 13.1 seconds (ref 11.4–15.2)

## 2018-04-26 MED ORDER — WARFARIN SODIUM 10 MG PO TABS: 20.0000 mg | ORAL_TABLET | Freq: Every day | ORAL | 0 refills | Status: DC

## 2018-04-26 MED ORDER — ENOXAPARIN SODIUM 120 MG/0.8ML ~~LOC~~ SOLN: 1.0000 mg/kg | Freq: Two times a day (BID) | SUBCUTANEOUS | 0 refills | Status: DC

## 2018-04-26 MED ORDER — SACCHAROMYCES BOULARDII 250 MG PO CAPS: ORAL_CAPSULE | ORAL | 0 refills | Status: DC

## 2018-04-26 MED ORDER — AMOXICILLIN-POT CLAVULANATE 875-125 MG PO TABS: 1.0000 | ORAL_TABLET | Freq: Two times a day (BID) | ORAL | 0 refills | Status: AC

## 2018-04-26 MED ORDER — AMOXICILLIN-POT CLAVULANATE 875-125 MG PO TABS: 1.0000 | ORAL_TABLET | Freq: Two times a day (BID) | ORAL | 0 refills | Status: DC

## 2018-04-26 MED ORDER — ACETAMINOPHEN 325 MG PO TABS: 650.0000 mg | ORAL_TABLET | Freq: Four times a day (QID) | ORAL | Status: DC | PRN

## 2018-04-26 MED ORDER — WARFARIN SODIUM 10 MG PO TABS: 20.0000 mg | ORAL_TABLET | Freq: Every day | ORAL | 2 refills | Status: DC

## 2018-04-26 MED ORDER — SACCHAROMYCES BOULARDII 250 MG PO CAPS: 250.0000 mg | ORAL_CAPSULE | Freq: Two times a day (BID) | ORAL | 0 refills | Status: DC

## 2018-04-26 MED ORDER — AMOXICILLIN-POT CLAVULANATE 875-125 MG PO TABS
1.0000 | ORAL_TABLET | Freq: Two times a day (BID) | ORAL | Status: DC
  Administered 2018-04-26: 1 via ORAL
  Filled 2018-04-26: qty 1

## 2018-04-26 MED ORDER — ACETAMINOPHEN 325 MG PO TABS: 650.0000 mg | ORAL_TABLET | Freq: Four times a day (QID) | ORAL | Status: AC | PRN

## 2018-04-26 MED ORDER — SACCHAROMYCES BOULARDII 250 MG PO CAPS: 250.0000 mg | ORAL_CAPSULE | Freq: Two times a day (BID) | ORAL | Status: DC

## 2018-04-26 NOTE — Progress Notes (Signed)
Reviewed AVS discharge instructions with patient/caregiver. Patient/caregiver verbalizes understanding of instructions received. AVS and prescriptions received by patient/caregiver. If present, telemetry box removed and central cardiac monitoring department notified of discharge. Peripheral IV removed, site benign with tip intact.  

## 2018-04-26 NOTE — Progress Notes (Signed)
    CC: Abdominal pain  Subjective: He is doing much better and is anxious to go home today.  Tolerating soft diet, no pain at all.  Objective: Vital signs in last 24 hours: Temp:  [97.8 F (36.6 C)-98.6 F (37 C)] 97.9 F (36.6 C) (01/09 0517) Pulse Rate:  [49-62] 49 (01/09 0517) Resp:  [18-22] 18 (01/09 0517) BP: (104-127)/(61-72) 104/61 (01/09 0517) SpO2:  [94 %-97 %] 94 % (01/09 0517) Last BM Date: 04/24/18  Intake/Output from previous day: No intake/output data recorded. Intake/Output this shift: No intake/output data recorded.  General appearance: alert, cooperative and no distress Resp: clear to auscultation bilaterally GI: soft, non-tender; bowel sounds normal; no masses,  no organomegaly  Lab Results:  Recent Labs    04/25/18 0244 04/26/18 0236  WBC 7.6 8.2  HGB 15.6 16.0  HCT 44.5 45.6  PLT 250 271    BMET Recent Labs    04/25/18 0244  NA 137  K 3.9  CL 106  CO2 23  GLUCOSE 98  BUN 5*  CREATININE 1.11  CALCIUM 9.2   PT/INR Recent Labs    04/26/18 0236  LABPROT 13.1  INR 1.00    Recent Labs  Lab 04/22/18 1015  AST 19  ALT 26  ALKPHOS 71  BILITOT 0.6  PROT 7.5  ALBUMIN 3.7     Lipase     Component Value Date/Time   LIPASE 49 04/22/2018 1015     Medications: . enoxaparin (LOVENOX) injection  1 mg/kg Subcutaneous Q12H  . pantoprazole  40 mg Oral QHS  . Warfarin - Pharmacist Dosing Inpatient   Does not apply q1800    Assessment/Plan  Factor V mutation   - Medicine is arranging follow up in the Clinic her for this issue.  Starting coumadin and bridging with Lovenox Hx of VTE IVC placement 11/2007 Hx right minithoracotomy with evacuation of hematoma PVD with right iliac artery angioplasty 06/2616  Diverticulitis with microperforation  FEN: IV fluids/n.p.o. ID: Flagyl times three 1/5;Zosyn 1/5>>day5 convert to Augmentin for 10 more days. DVT: Therapeutic Lovenox Follow-up: He will need to follow-up with his PCP  for his diverticulitis.  He will also need a colonoscopy in about 6 weeks.  He can return to see our office, and Dr. Doylene Canard as needed.  Plan: Discharge home today Lovenox bridging and Coumadin per medicine.  10 more days of oral antibiotics.  Follow-up with primary care and he will need a colonoscopy in 6 weeks.    LOS: 4 days    Jose Alleyne 04/26/2018 [phone]

## 2018-04-26 NOTE — Progress Notes (Signed)
PROGRESS NOTE    [name] [name]  ZHY:[id number] DOB: May 17, 1980 DOA: 04/22/2018 PCP: Levora DredgeHelberg, Justin, MD   Brief Narrative:  Consulted for anticoagulation. This is a 38 year old male with history of factor V Leiden deficiency with history of DVTs, MI, GERD, PE, currently admitted for diverticulitis with colon perforation.  Currently being managed by general surgery. Assessment & Plan   Factor V Leiden deficiency -It seems that patient was supposed to be on Lovenox and Coumadin in the past however lost insurance. -Currently restarted back on Coumadin 20 mg daily and being bridged with Lovenox 115 mg subcu every 12 hours. -Case management consulted for PCP and Coumadin clinic -Prescriptions for Coumadin as well as Lovenox have been sent to the transition of care pharmacy -Goal INR is between 2 and 3  DVT Prophylaxis  Lovenox/coumadin  Code Status: Full  Family Communication: Family at bedside  Disposition Plan: Admitted. Dispo per general surgery. Likely home today.  Consultants TRH  Procedures    Antibiotics   Anti-infectives (From admission, onward)   Start     Dose/Rate Route Frequency Ordered Stop   04/26/18 1000  amoxicillin-clavulanate (AUGMENTIN) 875-125 MG per tablet 1 tablet     1 tablet Oral Every 12 hours 04/26/18 0919     04/26/18 0000  amoxicillin-clavulanate (AUGMENTIN) 875-125 MG tablet     1 tablet Oral Every 12 hours 04/26/18 0928 05/06/18 2359   04/22/18 1630  piperacillin-tazobactam (ZOSYN) IVPB 3.375 g     3.375 g 100 mL/hr over 30 Minutes Intravenous  Once 04/22/18 1607 04/22/18 1843   04/22/18 1630  metroNIDAZOLE (FLAGYL) IVPB 500 mg  Status:  Discontinued     500 mg 100 mL/hr over 60 Minutes Intravenous Every 8 hours 04/22/18 1607 04/22/18 1609   04/22/18 1630  metroNIDAZOLE (FLAGYL) IVPB 500 mg     500 mg 100 mL/hr over 60 Minutes Intravenous  Once 04/22/18 1609 04/22/18 1742   04/22/18 1430  ciprofloxacin (CIPRO) IVPB 400 mg  Status:   Discontinued     400 mg 200 mL/hr over 60 Minutes Intravenous  Once 04/22/18 1417 04/22/18 1429   04/22/18 1430  metroNIDAZOLE (FLAGYL) IVPB 500 mg  Status:  Discontinued     500 mg 100 mL/hr over 60 Minutes Intravenous  Once 04/22/18 1417 04/22/18 1607   04/22/18 1430  piperacillin-tazobactam (ZOSYN) IVPB 3.375 g  Status:  Discontinued     3.375 g 100 mL/hr over 30 Minutes Intravenous  Once 04/22/18 1429 04/22/18 1606   04/22/18 0000  piperacillin-tazobactam (ZOSYN) IVPB 3.375 g  Status:  Discontinued     3.375 g 12.5 mL/hr over 240 Minutes Intravenous Every 8 hours 04/22/18 1609 04/26/18 0919      Subjective:   [name] [name] seen and examined today.  Patient has no complaints today.  Feeling very well and like to go home.  Denies current chest pain, shortness breath, abdominal pain, nausea vomiting, diarrhea constipation, dizziness or headache.  States that he has PCP and Coumadin clinic that he will go see upon discharge.  Objective:   Vitals:   04/25/18 0530 04/25/18 1401 04/25/18 2039 04/26/18 0517  BP: (!) 142/89 114/66 127/72 104/61  Pulse: 61 62 62 (!) 49  Resp: 18 (!) 22 18 18   Temp: (!) 97.5 F (36.4 C) 97.8 F (36.6 C) 98.6 F (37 C) 97.9 F (36.6 C)  TempSrc: Oral Oral Oral Oral  SpO2: 98% 97% 95% 94%  Weight:      Height:  No intake or output data in the 24 hours ending 04/26/18 0953 Filed Weights   04/22/18 0939  Weight: 113.4 kg    Exam  General: Well developed, well nourished, NAD, appears stated age  HEENT: NCAT,mucous membranes moist.  Poor dentition  Cardiovascular: S1 S2 auscultated, RRR  Respiratory: Clear to auscultation bilaterally   Abdomen: Soft, nontender, nondistended, + bowel sounds  Extremities: warm dry without cyanosis clubbing or edema  Neuro: AAOx3, nonfocal  Psych: Normal affect and demeanor    Data Reviewed: I have personally reviewed following labs and imaging studies  CBC: Recent Labs  Lab 04/22/18 1015  04/23/18 0307 04/25/18 0244 04/26/18 0236  WBC 16.6* 9.5 7.6 8.2  HGB 15.7 14.3 15.6 16.0  HCT 46.5 40.9 44.5 45.6  MCV 93.4 93.2 90.8 91.6  PLT 250 230 250 271   Basic Metabolic Panel: Recent Labs  Lab 04/22/18 1015 04/23/18 0307 04/25/18 0244  NA 137 137 137  K 3.9 3.9 3.9  CL 104 106 106  CO2 24 23 23   GLUCOSE 103* 102* 98  BUN 13 10 5*  CREATININE 0.96 1.00 1.11  CALCIUM 9.6 8.9 9.2   GFR: Estimated Creatinine Clearance: 116.1 mL/min (by C-G formula based on SCr of 1.11 mg/dL). Liver Function Tests: Recent Labs  Lab 04/22/18 1015  AST 19  ALT 26  ALKPHOS 71  BILITOT 0.6  PROT 7.5  ALBUMIN 3.7   Recent Labs  Lab 04/22/18 1015  LIPASE 49   No results for input(s): AMMONIA in the last 168 hours. Coagulation Profile: Recent Labs  Lab 04/26/18 0236  INR 1.00   Cardiac Enzymes: No results for input(s): CKTOTAL, CKMB, CKMBINDEX, TROPONINI in the last 168 hours. BNP (last 3 results) No results for input(s): PROBNP in the last 8760 hours. HbA1C: No results for input(s): HGBA1C in the last 72 hours. CBG: No results for input(s): GLUCAP in the last 168 hours. Lipid Profile: No results for input(s): CHOL, HDL, LDLCALC, TRIG, CHOLHDL, LDLDIRECT in the last 72 hours. Thyroid Function Tests: No results for input(s): TSH, T4TOTAL, FREET4, T3FREE, THYROIDAB in the last 72 hours. Anemia Panel: No results for input(s): VITAMINB12, FOLATE, FERRITIN, TIBC, IRON, RETICCTPCT in the last 72 hours. Urine analysis:    Component Value Date/Time   COLORURINE YELLOW 04/22/2018 1141   APPEARANCEUR CLEAR 04/22/2018 1141   LABSPEC 1.018 04/22/2018 1141   PHURINE 5.0 04/22/2018 1141   GLUCOSEU NEGATIVE 04/22/2018 1141   HGBUR SMALL (A) 04/22/2018 1141   BILIRUBINUR NEGATIVE 04/22/2018 1141   KETONESUR NEGATIVE 04/22/2018 1141   PROTEINUR NEGATIVE 04/22/2018 1141   UROBILINOGEN 1.0 08/22/2012 2245   NITRITE NEGATIVE 04/22/2018 1141   LEUKOCYTESUR NEGATIVE 04/22/2018  1141   Sepsis Labs: @LABRCNTIP (procalcitonin:4,lacticidven:4)  )No results found for this or any previous visit (from the past 240 hour(s)).    Radiology Studies: No results found.   Scheduled Meds: . amoxicillin-clavulanate  1 tablet Oral Q12H  . enoxaparin (LOVENOX) injection  1 mg/kg Subcutaneous Q12H  . pantoprazole  40 mg Oral QHS  . saccharomyces boulardii  250 mg Oral BID  . Warfarin - Pharmacist Dosing Inpatient   Does not apply q1800   Continuous Infusions:   LOS: 4 days   Time Spent in minutes   30 minutes  Elanna Bert D.O. on 04/26/2018 at 9:53 AM  Between 7am to 7pm - Please see pager noted on [email or web address]  After 7pm go to [email or web address]  And look for the night coverage person covering for me after hours  [place]  [phone]

## 2018-04-26 NOTE — Care Management Note (Signed)
Case Management Note  Patient Details  Name: ZAHID ARY MRN: [id number] Date of Birth: 1980-12-16  Subjective/Objective:                    Action/Plan: Provided [facility] pharmacy with prescription coverage information.  Confirmed with clinic PCP is [facility]. Clinic has scheduled hospital follow up appointment for Monday April 30, 2018 at 0945 and INR check.  Expected Discharge Date:  04/26/18               Expected Discharge Plan:  Home/Self Care  In-House Referral:     Discharge planning Services  CM Consult, Medication Assistance  Post Acute Care Choice:  NA Choice offered to:  Patient  DME Arranged:  N/A DME Agency:  NA  HH Arranged:  NA HH Agency:  NA  Status of Service:  Completed, signed off  If discussed at Long Length of Stay Meetings, dates discussed:    Additional Comments:  Kingsley Plan, RN 04/26/2018, 10:07 AM

## 2018-04-27 ENCOUNTER — Other Ambulatory Visit: Payer: Self-pay | Admitting: Pharmacist

## 2018-04-27 DIAGNOSIS — I82409 Acute embolism and thrombosis of unspecified deep veins of unspecified lower extremity: Secondary | ICD-10-CM

## 2018-04-27 DIAGNOSIS — D6859 Other primary thrombophilia: Secondary | ICD-10-CM

## 2018-04-27 DIAGNOSIS — Z7901 Long term (current) use of anticoagulants: Secondary | ICD-10-CM

## 2018-04-30 ENCOUNTER — Encounter: Payer: Self-pay | Admitting: Internal Medicine

## 2018-04-30 ENCOUNTER — Ambulatory Visit: Payer: BLUE CROSS/BLUE SHIELD

## 2018-05-28 ENCOUNTER — Ambulatory Visit: Payer: BLUE CROSS/BLUE SHIELD | Admitting: Pharmacist

## 2019-08-22 ENCOUNTER — Encounter: Payer: Self-pay | Admitting: *Deleted

## 2019-10-11 ENCOUNTER — Telehealth (HOSPITAL_COMMUNITY): Payer: Self-pay | Admitting: Emergency Medicine

## 2019-10-11 ENCOUNTER — Emergency Department (HOSPITAL_COMMUNITY)
Admission: EM | Admit: 2019-10-11 | Discharge: 2019-10-11 | Disposition: A | Discharge status: Home / Self Care | Payer: 59 | Attending: Emergency Medicine | Admitting: Emergency Medicine

## 2019-10-11 ENCOUNTER — Other Ambulatory Visit: Payer: Self-pay

## 2019-10-11 ENCOUNTER — Encounter (HOSPITAL_COMMUNITY): Payer: Self-pay | Admitting: Emergency Medicine

## 2019-10-11 ENCOUNTER — Emergency Department (HOSPITAL_BASED_OUTPATIENT_CLINIC_OR_DEPARTMENT_OTHER): Payer: 59

## 2019-10-11 DIAGNOSIS — Z86718 Personal history of other venous thrombosis and embolism: Secondary | ICD-10-CM | POA: Insufficient documentation

## 2019-10-11 DIAGNOSIS — Z86711 Personal history of pulmonary embolism: Secondary | ICD-10-CM | POA: Diagnosis not present

## 2019-10-11 DIAGNOSIS — L538 Other specified erythematous conditions: Secondary | ICD-10-CM

## 2019-10-11 DIAGNOSIS — I8001 Phlebitis and thrombophlebitis of superficial vessels of right lower extremity: Secondary | ICD-10-CM | POA: Diagnosis not present

## 2019-10-11 DIAGNOSIS — I82890 Acute embolism and thrombosis of other specified veins: Secondary | ICD-10-CM

## 2019-10-11 DIAGNOSIS — M79604 Pain in right leg: Secondary | ICD-10-CM | POA: Diagnosis present

## 2019-10-11 DIAGNOSIS — Z79899 Other long term (current) drug therapy: Secondary | ICD-10-CM | POA: Diagnosis not present

## 2019-10-11 DIAGNOSIS — M79609 Pain in unspecified limb: Secondary | ICD-10-CM | POA: Diagnosis not present

## 2019-10-11 DIAGNOSIS — R609 Edema, unspecified: Secondary | ICD-10-CM | POA: Diagnosis not present

## 2019-10-11 DIAGNOSIS — Z87891 Personal history of nicotine dependence: Secondary | ICD-10-CM | POA: Diagnosis not present

## 2019-10-11 MED ORDER — APIXABAN 5 MG PO TABS: ORAL_TABLET | ORAL | 0 refills | Status: DC

## 2019-10-11 MED ORDER — APIXABAN (ELIQUIS) EDUCATION KIT FOR DVT/PE PATIENTS
PACK | Freq: Once | Status: AC
  Filled 2019-10-11: qty 1

## 2019-10-11 MED ORDER — APIXABAN (ELIQUIS) VTE STARTER PACK (10MG AND 5MG): ORAL_TABLET | ORAL | 0 refills | Status: DC

## 2019-10-11 MED ORDER — APIXABAN 5 MG PO TABS
10.0000 mg | ORAL_TABLET | Freq: Two times a day (BID) | ORAL | Status: DC
  Filled 2019-10-11 (×2): qty 2

## 2019-10-11 MED ORDER — APIXABAN 5 MG PO TABS
10.0000 mg | ORAL_TABLET | Freq: Once | ORAL | Status: AC
  Administered 2019-10-11: 10 mg via ORAL
  Filled 2019-10-11: qty 2

## 2019-10-11 NOTE — Discharge Instructions (Signed)
Pharmacy should discuss with you dosing and provide you with a coupon for Eliquis.  You will take as prescribed 10 mg twice daily for 7 days then 5 mg twice daily from then on.

## 2019-10-11 NOTE — ED Triage Notes (Signed)
Pt reports pain to R leg (mid calf up to his thigh) for the past few weeks, with redness and swelling, reports hx of dvt, not currently on anticoags.

## 2019-10-11 NOTE — Telephone Encounter (Cosign Needed)
Patient originally prescribed the Eliquis starter pack however [facility] is unable to use 30-day prescription trial unless it is prescribed outside of the Dosepak.  I discontinued the Eliquis starter pack and represcribed Eliquis with 10 mg p.o. twice daily 7 days and 5 mg p.o. twice daily for 23 days.  He will follow-up with his PCP for transition to Coumadin.

## 2019-10-11 NOTE — Progress Notes (Addendum)
Right lower extremity venous duplex completed. Refer to "CV Proc" under chart review to view preliminary results.  Preliminary results discussed with Solon Augusta, PA-C.  10/11/2019 1:52 PM Eula Fried., MHA, RVT, RDCS, RDMS

## 2019-10-11 NOTE — ED Provider Notes (Signed)
[facility] EMERGENCY DEPARTMENT Provider Note   CSN: [id number] Arrival date & time: 10/11/19  1022     History Chief Complaint  Patient presents with  . Leg Pain    [name] [name] is a 39 y.o. male.  HPI Patient is 39 year old male with history of factor V Leiden deficiency and DVTs presented today with right leg pain which is achy, severe, worse with touch and movement.  He states that he has been off his Xarelto for 6 months because of inability to afford this.  He denies any chest pain or shortness of breath.  No nausea vomiting weakness or dizziness.  Denies any fevers or chills.  States that the area is very tender to touch there is somewhat red.  He denies any other symptoms today.  Patient states his symptoms began several weeks ago but is uncertain exactly how long it has been.  He states is been somewhat worsening gradually over time.  He has tried no medications prior to arrival.     Past Medical History:  Diagnosis Date  . Anxiety and depression   . DVT (deep venous thrombosis) ([place])    Pt unsure of date   . Factor V Leiden mutation ([place])    With resultant hypercoaguable state, Hx of DVT, PE, SP IVC filter placement, on chronic coumadin and requires high doses to maintine IRN 2-3.  Marland Kitchen GERD (gastroesophageal reflux disease)    history of  . Myocardial infarction ([place])   . PE (pulmonary thromboembolism) ([place])    unsure of date  . Pneumonia   . Pneumothorax 8/11   HX of, requiring chest tube/hospitalization.   . Venous stasis    bilateral LE.    Patient Active Problem List   Diagnosis Date Noted  . Factor V Leiden, prothrombin gene mutation ([place]) 04/25/2018  . Diverticulitis of colon with perforation 04/22/2018  . Long term current use of anticoagulant 05/09/2010  . Venous stasis dermatitis of both lower extremities 10/28/2008  . PRIMARY HYPERCOAGULABLE STATE 12/18/2007  . Acute thromboembolism of deep veins of lower extremity ([place])  12/13/2007    Past Surgical History:  Procedure Laterality Date  . ANGIOPLASTY ILLIAC ARTERY Right 07/11/2016   Procedure: balloon ANGIOPLASTY of Inferior vena cava, right external iliac vein, right common femoral vein;  Surgeon: Waynetta Sandy, MD;  Location: [facility];  Service: Vascular;  Laterality: Right;  . INTRAVASCULAR ULTRASOUND/IVUS Right 07/11/2016   Procedure: INTRAVASCULAR ULTRASOUND/IVUS of right popliteal vein, right common femoral vein, right femoral vein, right exterternal iliac vein, and right internal iliac vein;  Surgeon: Waynetta Sandy, MD;  Location: [facility];  Service: Vascular;  Laterality: Right;  . LOWER EXTREMITY ANGIOGRAPHY N/A 07/12/2016   Procedure: LYSIS RECHECK;  Surgeon: Serafina Mitchell, MD;  Location: [facility];  Service: Cardiovascular;  Laterality: N/A;  . placement of IVC filter  8/09  . Placement of large-bore right chest tube  8/11  . right minithoracotomy with evacuation of hematoma  8/09  . ULTRASOUND GUIDANCE FOR VASCULAR ACCESS Right 07/11/2016   Procedure: ULTRASOUND GUIDANCE FOR VASCULAR ACCESS;  Surgeon: Waynetta Sandy, MD;  Location: [facility];  Service: Vascular;  Laterality: Right;       No family history on file.  Social History   Tobacco Use  . Smoking status: Former Smoker    Packs/day: 1.00    Years: 20.00    Pack years: 20.00    Types: Cigarettes    Start date: 07/07/2016  .  Smokeless tobacco: Former Systems developer  . Tobacco comment: 1/2/pak per day  Substance Use Topics  . Alcohol use: No  . Drug use: Yes    Frequency: 1.0 times per week    Types: Marijuana    Comment: last  07/07/16    Home Medications Prior to Admission medications   Medication Sig Start Date End Date Taking? Authorizing Provider  acetaminophen (TYLENOL) 325 MG tablet Take 2 tablets (650 mg total) by mouth every 6 (six) hours as needed for mild pain (or temp > 100). 04/26/18   Earnstine Regal, PA-C  APIXABAN Arne [place]) VTE STARTER PACK  (10MG AND 5MG) Take as directed on package: start with two-71m tablets twice daily for 7 days. On day 8, switch to one-567mtablet twice daily. 10/11/19   FoTedd SiasPA  saccharomyces boulardii (FLORASTOR) 250 MG capsule Is a probiotic you can get it with the stool products and any drugstore without a prescription.  Take it for the next month and then you should be done with it. 04/26/18   JeEarnstine RegalPA-C  enoxaparin (LOVENOX) 120 MG/0.8ML injection Inject 0.76 mLs (115 mg total) into the skin every 12 (twelve) hours. 04/26/18 10/11/19  JeEarnstine RegalPA-C  warfarin (COUMADIN) 10 MG tablet Take 2 tablets (20 mg total) by mouth daily. 04/26/18 10/11/19  JeEarnstine RegalPA-C    Allergies    Patient has no known allergies.  Review of Systems   Review of Systems  Constitutional: Negative for chills and fever.  HENT: Negative for congestion.   Respiratory: Negative for shortness of breath.   Cardiovascular: Negative for chest pain.  Gastrointestinal: Negative for abdominal pain.  Musculoskeletal: Negative for neck pain.       Right thigh pain and redness    Physical Exam Updated Vital Signs BP 114/68 (BP Location: Right Arm)   Pulse 78   Temp 98.1 F (36.7 C) (Oral)   Resp 16   SpO2 98%   Physical Exam Vitals and nursing note reviewed.  Constitutional:      General: He is not in acute distress. HENT:     Head: Normocephalic and atraumatic.     Nose: Nose normal.  Eyes:     General: No scleral icterus. Cardiovascular:     Rate and Rhythm: Normal rate and regular rhythm.     Pulses: Normal pulses.     Heart sounds: Normal heart sounds.     Comments: 3+ symmetric DP/PT pulses Pulmonary:     Effort: Pulmonary effort is normal. No respiratory distress.     Breath sounds: No wheezing.  Abdominal:     Palpations: Abdomen is soft.     Tenderness: There is no abdominal tenderness. There is no guarding.  Musculoskeletal:     Cervical back: Normal range of motion.      Right lower leg: No edema.     Left lower leg: No edema.  Skin:    General: Skin is warm and dry.     Capillary Refill: Capillary refill takes less than 2 seconds.     Comments: Areas of warmth and tenderness of the right lateral and medial thigh distally.  Some tenderness of the right calf with no swelling or erythema  Neurological:     Mental Status: He is alert. Mental status is at baseline.     Comments: Sensation intact bilaterally  Psychiatric:        Mood and Affect: Mood normal.        Behavior: Behavior normal.  ED Results / Procedures / Treatments   Labs (all labs ordered are listed, but only abnormal results are displayed) Labs Reviewed  I-STAT CHEM 8, ED    EKG None  Radiology VAS [place] LOWER EXTREMITY VENOUS (DVT) (ONLY [facility] & [facility])  Result Date: 10/11/2019  Lower Venous DVTStudy Indications: Pain, Edema, Erythema, Palpable Cord, and history of protein B deficiency.  Limitations: Poor ultrasound/tissue interface. Comparison Study: No prior study Performing Technologist: Maudry Mayhew MHA, RDMS, RVT, RDCS  Examination Guidelines: A complete evaluation includes B-mode imaging, spectral Doppler, color Doppler, and power Doppler as needed of all accessible portions of each vessel. Bilateral testing is considered an integral part of a complete examination. Limited examinations for reoccurring indications may be performed as noted. The reflux portion of the exam is performed with the patient in reverse Trendelenburg.  +---------------+---------------+---------+-----------+----------+-------------+ RIGHT          CompressibilityPhasicitySpontaneityPropertiesThrombus                                                                  Aging         +---------------+---------------+---------+-----------+----------+-------------+ CFV            Full           Yes      Yes                                 +---------------+---------------+---------+-----------+----------+-------------+ SFJ            Full                                                       +---------------+---------------+---------+-----------+----------+-------------+ FV Prox        Full                                                       +---------------+---------------+---------+-----------+----------+-------------+ FV Mid         Full                                                       +---------------+---------------+---------+-----------+----------+-------------+ FV Distal      Full                                                       +---------------+---------------+---------+-----------+----------+-------------+ PFV            Full                                                       +---------------+---------------+---------+-----------+----------+-------------+  POP            Partial        Yes      Yes                  Chronic       +---------------+---------------+---------+-----------+----------+-------------+ GSV            Full                                         Acute         +---------------+---------------+---------+-----------+----------+-------------+ VV- lateral    None                                         Acute         thigh                                                                     +---------------+---------------+---------+-----------+----------+-------------+   Right Technical Findings: Not visualized segments include PTV, peroneal veins.  Left Technical Findings: Not visualized segments include CFV.   Summary: RIGHT: - Findings consistent with acute superficial vein thrombosis involving the right great saphenous vein, and right varicosities or other superficial veins. - There is no evidence of deep vein thrombosis in the lower extremity. However, portions of this examination were limited- see technologist comments above.   *See table(s)  above for measurements and observations.    Preliminary     Procedures Procedures (including critical care time)  Medications Ordered in ED Medications  apixaban (ELIQUIS) tablet 10 mg (has no administration in time range)  apixaban (ELIQUIS) Education Kit for DVT/PE patients (has no administration in time range)    ED Course  I have reviewed the triage vital signs and the nursing notes.  Pertinent labs & imaging results that were available during my care of the patient were reviewed by me and considered in my medical decision making (see chart for details).    MDM Rules/Calculators/A&P                          Patient is 39 year old male with history of factor V Leiden deficiency presented today with right leg pain, falling to the right calf and right lateral and medial thigh for the past 2 weeks.  He has a history of DVTs and is not currently on anticoagulation although he has been prescribed Xarelto in the past but discontinued himself approximately 6 months ago because of difficulty affording this.  Physical exam notable for redness and tenderness of the right lateral and medial thigh and tenderness of the right calf.  Good distal pulses and sensation.  Good strength.  Compression ultrasound negative for deep venous thrombosis however positive for superficial saphenous and other superficial venous thrombosis.  Discussed this case with Dr. Deno Etienne my attending physician at the time.  Will begin patient on apixaban.  He was given 1 dose here in the ED and discharged with starter pack.  Pharmacy provided patient with coupons.  He will follow-up with his PCP for transition to Coumadin.  Final Clinical Impression(s) / ED Diagnoses Final diagnoses:  Thrombophlebitis of superficial veins of right lower extremity    Rx / DC Orders ED Discharge Orders         Ordered    APIXABAN (ELIQUIS) VTE STARTER PACK (10MG AND 5MG)     Discontinue  Reprint     10/11/19 [place], Ascher Schroepfer S, PA 10/11/19 [place], Langdon, DO 10/14/19 1628

## 2019-10-11 NOTE — ED Notes (Signed)
Patient Alert and oriented to baseline. Stable and ambulatory to baseline. Patient verbalized understanding of the discharge instructions.  Patient belongings were taken by the patient.   

## 2019-10-11 NOTE — ED Notes (Signed)
Pt called multiple times for triage with no response. 

## 2019-12-28 ENCOUNTER — Other Ambulatory Visit: Payer: Self-pay

## 2019-12-28 ENCOUNTER — Encounter (HOSPITAL_COMMUNITY): Payer: Self-pay | Admitting: Emergency Medicine

## 2019-12-28 ENCOUNTER — Emergency Department (HOSPITAL_COMMUNITY)
Admission: EM | Admit: 2019-12-28 | Discharge: 2019-12-28 | Disposition: A | Discharge status: Home / Self Care | Payer: 59 | Source: Home / Self Care | Attending: Emergency Medicine | Admitting: Emergency Medicine

## 2019-12-28 ENCOUNTER — Emergency Department (HOSPITAL_BASED_OUTPATIENT_CLINIC_OR_DEPARTMENT_OTHER): Payer: 59

## 2019-12-28 DIAGNOSIS — M7989 Other specified soft tissue disorders: Secondary | ICD-10-CM | POA: Diagnosis not present

## 2019-12-28 DIAGNOSIS — I2699 Other pulmonary embolism without acute cor pulmonale: Secondary | ICD-10-CM | POA: Diagnosis not present

## 2019-12-28 DIAGNOSIS — Z87891 Personal history of nicotine dependence: Secondary | ICD-10-CM | POA: Insufficient documentation

## 2019-12-28 DIAGNOSIS — I824Y2 Acute embolism and thrombosis of unspecified deep veins of left proximal lower extremity: Secondary | ICD-10-CM

## 2019-12-28 DIAGNOSIS — I82409 Acute embolism and thrombosis of unspecified deep veins of unspecified lower extremity: Secondary | ICD-10-CM

## 2019-12-28 DIAGNOSIS — Z86711 Personal history of pulmonary embolism: Secondary | ICD-10-CM | POA: Diagnosis not present

## 2019-12-28 DIAGNOSIS — I82422 Acute embolism and thrombosis of left iliac vein: Secondary | ICD-10-CM

## 2019-12-28 DIAGNOSIS — Z79899 Other long term (current) drug therapy: Secondary | ICD-10-CM | POA: Insufficient documentation

## 2019-12-28 DIAGNOSIS — M79609 Pain in unspecified limb: Secondary | ICD-10-CM | POA: Diagnosis not present

## 2019-12-28 DIAGNOSIS — I2694 Multiple subsegmental pulmonary emboli without acute cor pulmonale: Secondary | ICD-10-CM | POA: Diagnosis not present

## 2019-12-28 DIAGNOSIS — Z86718 Personal history of other venous thrombosis and embolism: Secondary | ICD-10-CM

## 2019-12-28 DIAGNOSIS — Z7901 Long term (current) use of anticoagulants: Secondary | ICD-10-CM | POA: Insufficient documentation

## 2019-12-28 MED ORDER — OXYCODONE-ACETAMINOPHEN 5-325 MG PO TABS
1.0000 | ORAL_TABLET | Freq: Once | ORAL | Status: AC
  Administered 2019-12-28: 1 via ORAL
  Filled 2019-12-28: qty 1

## 2019-12-28 MED ORDER — APIXABAN 5 MG PO TABS
5.0000 mg | ORAL_TABLET | Freq: Two times a day (BID) | ORAL | Status: DC
  Administered 2019-12-28: 5 mg via ORAL

## 2019-12-28 MED ORDER — APIXABAN 5 MG PO TABS
5.0000 mg | ORAL_TABLET | Freq: Once | ORAL | Status: AC
  Administered 2019-12-28: 5 mg via ORAL
  Filled 2019-12-28: qty 1

## 2019-12-28 MED ORDER — APIXABAN 5 MG PO TABS: ORAL_TABLET | ORAL | 1 refills | Status: DC

## 2019-12-28 NOTE — Consult Note (Signed)
Hospital Consult    Reason for Consult:  Extensive left leg DVT in setting of factor V leiden mutation Referring Physician: ED MRN #:  [id number]  History of Present Illness: This is a 39 y.o. male with history of multiple lower extremity DVTs as well as factor V Leiden mutation and remote PE and IVC filter that vascular surgery was consulted for left leg DVT.  Patient presented to the ED today with pain in his left thigh that has been ongoing for the last several days.  Ultimately underwent duplex that shows extensive left leg DVT into the left iliac vein.  He previously had an IVC filter placed years ago in the setting of a PE.  He has taken Xarelto, Coumadin, and Eliquis in the past in review of the notes.  Appears has been off anticoagulation for the last 6 weeks because he has been working out of town.    Previously had ultrasound-guided access of the right leg with lysis of the IVC and right iliac veins and balloon angioplasty of the external iliac and common femoral veins in 2018 by Dr. Randie Heinz and Dr. Myra Gianotti.  Does report previous DVT's in the left leg as well.  He states he is only one working in his family and is very worried about losing his job.  Past Medical History:  Diagnosis Date   Anxiety and depression    DVT (deep venous thrombosis) ([facility])    Pt unsure of date    Factor V Leiden mutation ([facility])    With resultant hypercoaguable state, Hx of DVT, PE, SP IVC filter placement, on chronic coumadin and requires high doses to maintine IRN 2-3.   GERD (gastroesophageal reflux disease)    history of   Myocardial infarction [facility])    PE (pulmonary thromboembolism) ([facility])    unsure of date   Pneumonia    Pneumothorax 8/11   HX of, requiring chest tube/hospitalization.    Venous stasis    bilateral LE.    Past Surgical History:  Procedure Laterality Date   ANGIOPLASTY ILLIAC ARTERY Right 07/11/2016   Procedure: balloon ANGIOPLASTY of Inferior vena cava, right external  iliac vein, right common femoral vein;  Surgeon: Maeola Harman, MD;  Location: [facility] OR;  Service: Vascular;  Laterality: Right;   INTRAVASCULAR ULTRASOUND/IVUS Right 07/11/2016   Procedure: INTRAVASCULAR ULTRASOUND/IVUS of right popliteal vein, right common femoral vein, right femoral vein, right exterternal iliac vein, and right internal iliac vein;  Surgeon: Maeola Harman, MD;  Location: [facility] OR;  Service: Vascular;  Laterality: Right;   LOWER EXTREMITY ANGIOGRAPHY N/A 07/12/2016   Procedure: LYSIS RECHECK;  Surgeon: Nada Libman, MD;  [facility];  Service: Cardiovascular;  Laterality: N/A;   placement of IVC filter  8/09   Placement of large-bore right chest tube  8/11   right minithoracotomy with evacuation of hematoma  8/09   ULTRASOUND GUIDANCE FOR VASCULAR ACCESS Right 07/11/2016   Procedure: ULTRASOUND GUIDANCE FOR VASCULAR ACCESS;  Surgeon: Maeola Harman, MD;  Location: [facility] OR;  Service: Vascular;  Laterality: Right;    No Known Allergies  Prior to Admission medications   Medication Sig Start Date End Date Taking? Authorizing Provider  acetaminophen (TYLENOL) 325 MG tablet Take 2 tablets (650 mg total) by mouth every 6 (six) hours as needed for mild pain (or temp > 100). 04/26/18   Sherrie George, PA-C  apixaban (ELIQUIS) 5 MG TABS tablet 10 mg twice each day, for 21 days, then 5  mg twice a day. 12/28/19   Mancel BaleWentz, Elliott, MD  saccharomyces boulardii (FLORASTOR) 250 MG capsule Is a probiotic you can get it with the stool products and any drugstore without a prescription.  Take it for the next month and then you should be done with it. 04/26/18   Sherrie GeorgeJennings, Willard, PA-C  enoxaparin (LOVENOX) 120 MG/0.8ML injection Inject 0.76 mLs (115 mg total) into the skin every 12 (twelve) hours. 04/26/18 10/11/19  Sherrie GeorgeJennings, Willard, PA-C  warfarin (COUMADIN) 10 MG tablet Take 2 tablets (20 mg total) by mouth daily. 04/26/18 10/11/19  Sherrie GeorgeJennings, Willard, PA-C      Social History   Socioeconomic History   Marital status: Single    Spouse name: Not on file   Number of children: Not on file   Years of education: Not on file   Highest education level: Not on file  Occupational History   Not on file  Tobacco Use   Smoking status: Former Smoker    Packs/day: 1.00    Years: 20.00    Pack years: 20.00    Types: Cigarettes    Start date: 07/07/2016   Smokeless tobacco: Former NeurosurgeonUser   Tobacco comment: 1/2/pak per day  Substance and Sexual Activity   Alcohol use: No   Drug use: Yes    Frequency: 1.0 times per week    Types: Marijuana    Comment: last  07/07/16   Sexual activity: Not on file  Other Topics Concern   Not on file  Social History Narrative   Currently works for [employer], Lives with his girlfriend and has one son.    Social Determinants of Health   Financial Resource Strain:    Difficulty of Paying Living Expenses: Not on file  Food Insecurity:    Worried About Programme researcher, broadcasting/film/videounning Out of Food in the Last Year: Not on file   [employer] Out of Food in the Last Year: Not on file  Transportation Needs:    Lack of Transportation (Medical): Not on file   Lack of Transportation (Non-Medical): Not on file  Physical Activity:    Days of Exercise per Week: Not on file   Minutes of Exercise per Session: Not on file  Stress:    Feeling of Stress : Not on file  Social Connections:    Frequency of Communication with Friends and Family: Not on file   Frequency of Social Gatherings with Friends and Family: Not on file   Attends Religious Services: Not on file   Active Member of Clubs or Organizations: Not on file   Attends BankerClub or Organization Meetings: Not on file   Marital Status: Not on file  Intimate Partner Violence:    Fear of Current or Ex-Partner: Not on file   Emotionally Abused: Not on file   Physically Abused: Not on file   Sexually Abused: Not on file     No family history on file.  ROS: [x]  Positive   [ ]   Negative   [ ]  All sytems reviewed and are negative  Cardiovascular: []  chest pain/pressure []  palpitations []  SOB lying flat []  DOE []  pain in legs while walking []  pain in legs at rest []  pain in legs at night []  non-healing ulcers []  hx of DVT []  swelling in legs  Pulmonary: []  productive cough []  asthma/wheezing []  home O2  Neurologic: []  weakness in []  arms []  legs []  numbness in []  arms []  legs []  hx of CVA []  mini stroke [] difficulty speaking or slurred speech []  temporary  loss of vision in one eye []  dizziness  Hematologic: []  hx of cancer []  bleeding problems []  problems with blood clotting easily  Endocrine:   []  diabetes []  thyroid disease  GI []  vomiting blood []  blood in stool  GU: []  CKD/renal failure []  HD--[]  M/W/F or []  T/T/S []  burning with urination []  blood in urine  Psychiatric: []  anxiety []  depression  Musculoskeletal: []  arthritis []  joint pain  Integumentary: []  rashes []  ulcers  Constitutional: []  fever []  chills   Physical Examination  Vitals:   12/28/19 0856 12/28/19 1804  BP: 131/87 123/77  Pulse: 95 75  Resp: 20 18  Temp: 98.2 F (36.8 C) 98.5 F (36.9 C)  SpO2: 100% 97%   There is no height or weight on file to calculate BMI.  General:  WDWN in NAD Gait: Not observed HENT: WNL, normocephalic Pulmonary: normal non-labored breathing, without Rales, rhonchi,  wheezing Cardiac: regular, without  Murmurs, rubs or gallops Abdomen: soft, NT/ND, no masses Vascular Exam/Pulses: Palpable dorsalis pedis posterior tibial pulses bilaterally. Left leg slightly more swollen than right but not significant as noted below. Chronic venous stasis changes bilateral lower extremities. Extremities: without ischemic changes, without Gangrene , without cellulitis; without open wounds;  Musculoskeletal: no muscle wasting or atrophy  Neurologic: A&O X 3; Appropriate Affect ; SENSATION: normal; MOTOR FUNCTION:  moving all  extremities equally. Speech is fluent/normal       CBC    Component Value Date/Time   WBC 8.2 04/26/2018 0236   RBC 4.98 04/26/2018 0236   HGB 16.0 04/26/2018 0236   HCT 45.6 04/26/2018 0236   PLT 271 04/26/2018 0236   MCV 91.6 04/26/2018 0236   MCH 32.1 04/26/2018 0236   MCHC 35.1 04/26/2018 0236   RDW 12.5 04/26/2018 0236   LYMPHSABS 1.7 09/06/2017 0851   MONOABS 1.0 09/06/2017 0851   EOSABS 0.6 09/06/2017 0851   BASOSABS 0.1 09/06/2017 0851    BMET    Component Value Date/Time   NA 137 04/25/2018 0244   K 3.9 04/25/2018 0244   CL 106 04/25/2018 0244   CO2 23 04/25/2018 0244   GLUCOSE 98 04/25/2018 0244   BUN 5 (L) 04/25/2018 0244   CREATININE 1.11 04/25/2018 0244   CALCIUM 9.2 04/25/2018 0244   GFRNONAA >60 04/25/2018 0244   GFRAA >60 04/25/2018 0244    COAGS: Lab Results  Component Value Date   INR 1.00 04/26/2018   INR 2.60 10/31/2016   INR 1.2 10/24/2016     Non-Invasive Vascular Imaging:    Left leg and IVC duplex shows acute DVT in the left common iliac vein, left external iliac vein, and throughout the left lower extremity.   ASSESSMENT/PLAN: This is a 39 y.o. male with history of multiple lower extremity DVTs as well as factor V Leiden mutation and remote PE and IVC filter that vascular surgery was consulted for left leg DVT.  He has previously undergone intervention for a right leg DVT with lysis here back in 2018 with Dr. 06/25/2018 and Dr. 06/25/2018.  Discussed with patient given the fact that he has an IVC filter placed at least 10 years ago in the setting of extensive left leg DVT it would reasonable to consider thrombolysis of his extensive left leg clot to prevent this from extending into the filter and prevent further symptoms of post-thrombotic syndrome.  On duplex today it appears that the IVC itself is open.  Ultimately patient is very hesitant and I offered thrombolysis this week in  the Cath Lab.  States he is only one working in his family and is  at risk of being cut from his job if he misses any more time.  I discussed can be very important that he stays on his Eliquis and we probably should arrange follow-up in 1 to 2 weeks in clininc to see how he is doing given there is a timeline for intervention.  Will arrange follow-up in our clinic.   Cephus Shelling, MD [facility] Office: [phone]

## 2019-12-28 NOTE — ED Provider Notes (Signed)
[facility] EMERGENCY DEPARTMENT Provider Note   CSN: [id number] Arrival date & time: 12/28/19  4098     History Chief Complaint  Patient presents with  . Leg Pain    [name] [name] is a 39 y.o. male.  HPI He presents for evaluation of left medial thigh pain which been present for several days, and started after he was on vacation and driving a bit.  No specific trauma known.  He states "it feels like a frog."  He denies shortness of breath, cough, chest pain, weakness or dizziness.  He has a history of factor V Leiden mutation, and is supposed to be on chronic anticoagulation with Eliquis.  He ran out 6 weeks ago.  He states he is getting a new doctor that can prescribe it.  There are no other known modifying factors.    Past Medical History:  Diagnosis Date  . Anxiety and depression   . DVT (deep venous thrombosis) (HCC)    Pt unsure of date   . Factor V Leiden mutation (HCC)    With resultant hypercoaguable state, Hx of DVT, PE, SP IVC filter placement, on chronic coumadin and requires high doses to maintine IRN 2-3.  Marland Kitchen GERD (gastroesophageal reflux disease)    history of  . Myocardial infarction (HCC)   . PE (pulmonary thromboembolism) (HCC)    unsure of date  . Pneumonia   . Pneumothorax 8/11   HX of, requiring chest tube/hospitalization.   . Venous stasis    bilateral LE.    Patient Active Problem List   Diagnosis Date Noted  . Factor V Leiden, prothrombin gene mutation (HCC) 04/25/2018  . Diverticulitis of colon with perforation 04/22/2018  . Long term current use of anticoagulant 05/09/2010  . Venous stasis dermatitis of both lower extremities 10/28/2008  . PRIMARY HYPERCOAGULABLE STATE 12/18/2007  . Acute thromboembolism of deep veins of lower extremity (HCC) 12/13/2007    Past Surgical History:  Procedure Laterality Date  . ANGIOPLASTY ILLIAC ARTERY Right 07/11/2016   Procedure: balloon ANGIOPLASTY of Inferior vena cava, right external  iliac vein, right common femoral vein;  Surgeon: Maeola Harman, MD;  Location: [facility] OR;  Service: Vascular;  Laterality: Right;  . INTRAVASCULAR ULTRASOUND/IVUS Right 07/11/2016   Procedure: INTRAVASCULAR ULTRASOUND/IVUS of right popliteal vein, right common femoral vein, right femoral vein, right exterternal iliac vein, and right internal iliac vein;  Surgeon: Maeola Harman, MD;  Location: [facility] OR;  Service: Vascular;  Laterality: Right;  . LOWER EXTREMITY ANGIOGRAPHY N/A 07/12/2016   Procedure: LYSIS RECHECK;  Surgeon: Nada Libman, MD;  Location: [facility] INVASIVE CV LAB;  Service: Cardiovascular;  Laterality: N/A;  . placement of IVC filter  8/09  . Placement of large-bore right chest tube  8/11  . right minithoracotomy with evacuation of hematoma  8/09  . ULTRASOUND GUIDANCE FOR VASCULAR ACCESS Right 07/11/2016   Procedure: ULTRASOUND GUIDANCE FOR VASCULAR ACCESS;  Surgeon: Maeola Harman, MD;  Location: [facility] OR;  Service: Vascular;  Laterality: Right;       No family history on file.  Social History   Tobacco Use  . Smoking status: Former Smoker    Packs/day: 1.00    Years: 20.00    Pack years: 20.00    Types: Cigarettes    Start date: 07/07/2016  . Smokeless tobacco: Former Neurosurgeon  . Tobacco comment: 1/2/pak per day  Substance Use Topics  . Alcohol use: No  . Drug use: Yes  Frequency: 1.0 times per week    Types: Marijuana    Comment: last  07/07/16    Home Medications Prior to Admission medications   Medication Sig Start Date End Date Taking? Authorizing Provider  acetaminophen (TYLENOL) 325 MG tablet Take 2 tablets (650 mg total) by mouth every 6 (six) hours as needed for mild pain (or temp > 100). 04/26/18   Sherrie George, PA-C  apixaban (ELIQUIS) 5 MG TABS tablet 10 mg twice each day, for 21 days, then 5 mg twice a day. 12/28/19   Mancel Bale, MD  saccharomyces boulardii (FLORASTOR) 250 MG capsule Is a probiotic you can get it with the stool  products and any drugstore without a prescription.  Take it for the next month and then you should be done with it. 04/26/18   Sherrie George, PA-C  enoxaparin (LOVENOX) 120 MG/0.8ML injection Inject 0.76 mLs (115 mg total) into the skin every 12 (twelve) hours. 04/26/18 10/11/19  Sherrie George, PA-C  warfarin (COUMADIN) 10 MG tablet Take 2 tablets (20 mg total) by mouth daily. 04/26/18 10/11/19  Sherrie George, PA-C    Allergies    Patient has no known allergies.  Review of Systems   Review of Systems  All other systems reviewed and are negative.   Physical Exam Updated Vital Signs BP 123/77 (BP Location: Left Arm)   Pulse 75   Temp 98.5 F (36.9 C) (Oral)   Resp 18   SpO2 97%   Physical Exam Vitals and nursing note reviewed.  Constitutional:      Appearance: He is well-developed.  HENT:     Head: Normocephalic and atraumatic.     Right Ear: External ear normal.     Left Ear: External ear normal.  Eyes:     Conjunctiva/sclera: Conjunctivae normal.     Pupils: Pupils are equal, round, and reactive to light.  Neck:     Trachea: Phonation normal.  Cardiovascular:     Rate and Rhythm: Normal rate.  Pulmonary:     Effort: Pulmonary effort is normal.  Abdominal:     General: There is no distension.  Musculoskeletal:        General: Normal range of motion.     Cervical back: Normal range of motion and neck supple.     Comments: Left medial thigh is tender to palpation without deformity, skin changes, palpable mass or lesion.  There is a small amount of left inguinal adenopathy which is mildly tender to palpation.  There is no inguinal mass.  He has decreased motion of the left leg secondary to pain in the left medial thigh.  Right leg is asymptomatic, to palpation touch and movement.  Skin:    General: Skin is warm and dry.     Comments: Chronic appearing skin discoloration of the lower extremities bilaterally.  Neurological:     Mental Status: He is alert and oriented to  person, place, and time.     Cranial Nerves: No cranial nerve deficit.     Sensory: No sensory deficit.     Motor: No abnormal muscle tone.     Coordination: Coordination normal.  Psychiatric:        Mood and Affect: Mood normal.        Behavior: Behavior normal.        Thought Content: Thought content normal.        Judgment: Judgment normal.     ED Results / Procedures / Treatments   Labs (all labs ordered are  listed, but only abnormal results are displayed) Labs Reviewed - No data to display  EKG None  Radiology VAS [place] IVC/ILIAC (VENOUS ONLY)  Result Date: 12/28/2019 IVC/ILIAC STUDY Indications: DVT Other Factors: Protein B, non compliant on Eliquis.  Comparison Study: No prior Performing Technologist: Sherren Kerns RVS  Examination Guidelines: A complete evaluation includes B-mode imaging, spectral Doppler, color Doppler, and power Doppler as needed of all accessible portions of each vessel. Bilateral testing is considered an integral part of a complete examination. Limited examinations for reoccurring indications may be performed as noted.  IVC/Iliac Findings: +----------+------+--------+--------+    IVC    PatentThrombusComments +----------+------+--------+--------+ IVC Prox  patent                 +----------+------+--------+--------+ IVC Mid   patent                 +----------+------+--------+--------+ IVC Distalpatent                 +----------+------+--------+--------+  +-------------------+---------+-----------+---------+-----------+--------+         CIV        RT-PatentRT-ThrombusLT-PatentLT-ThrombusComments +-------------------+---------+-----------+---------+-----------+--------+ Common Iliac Prox                                  acute            +-------------------+---------+-----------+---------+-----------+--------+ Common Iliac Mid    patent                         acute             +-------------------+---------+-----------+---------+-----------+--------+ Common Iliac Distal patent                         acute            +-------------------+---------+-----------+---------+-----------+--------+  +-------------------------+---------+-----------+---------+-----------+--------+            EIV           RT-PatentRT-ThrombusLT-PatentLT-ThrombusComments +-------------------------+---------+-----------+---------+-----------+--------+ External Iliac Vein Prox  patent                         acute            +-------------------------+---------+-----------+---------+-----------+--------+ External Iliac Vein Mid   patent                         acute            +-------------------------+---------+-----------+---------+-----------+--------+ External Iliac Vein       patent                         acute            Distal                                                                    +-------------------------+---------+-----------+---------+-----------+--------+   Summary: IVC/Iliac: There is no evidence of thrombus involving the IVC. There is no evidence of thrombus involving the right common iliac vein. There is evidence of acute thrombus involving the left common iliac vein. There is no evidence of thrombus involving the right  external iliac vein. There is evidence of acute thrombus involving the left external iliac vein.  *See table(s) above for measurements and observations.  Electronically signed by Sherald Hess MD on 12/28/2019 at 2:20:29 PM.    Final    VAS [place] LOWER EXTREMITY VENOUS (DVT) ([facility] and [facility] 7a-7p)  Result Date: 12/28/2019  Lower Venous DVT Study Indications: Pain, and Swelling.  Risk Factors: Protein B deficiency. Anticoagulation: Eliquis (non compliant) Comparison Study: Prior study from 09/06/17 is available for comparison Performing Technologist: Sherren Kerns RVS  Examination Guidelines: A complete evaluation includes B-mode  imaging, spectral Doppler, color Doppler, and power Doppler as needed of all accessible portions of each vessel. Bilateral testing is considered an integral part of a complete examination. Limited examinations for reoccurring indications may be performed as noted. The reflux portion of the exam is performed with the patient in reverse Trendelenburg.  +-----+---------------+---------+-----------+----------+----------------------+ RIGHTCompressibilityPhasicitySpontaneityPropertiesThrombus Aging         +-----+---------------+---------+-----------+----------+----------------------+ CFV  Full           Yes      Yes                  (imaged on IVC/Iliac                                                     study)                 +-----+---------------+---------+-----------+----------+----------------------+   +---------+---------------+---------+-----------+----------+--------------+ LEFT     CompressibilityPhasicitySpontaneityPropertiesThrombus Aging +---------+---------------+---------+-----------+----------+--------------+ CFV      None                                         Acute          +---------+---------------+---------+-----------+----------+--------------+ SFJ      None                                         Acute          +---------+---------------+---------+-----------+----------+--------------+ FV Prox  None                                         Acute          +---------+---------------+---------+-----------+----------+--------------+ FV Mid   None                                         Acute          +---------+---------------+---------+-----------+----------+--------------+ FV DistalNone                                         Acute          +---------+---------------+---------+-----------+----------+--------------+ PFV      None  Acute           +---------+---------------+---------+-----------+----------+--------------+ POP      None                                         Acute          +---------+---------------+---------+-----------+----------+--------------+ PTV      Partial                                      Acute          +---------+---------------+---------+-----------+----------+--------------+ PERO     Partial                                      Chronic        +---------+---------------+---------+-----------+----------+--------------+     Summary: RIGHT: - Findings consistent with acute superficial vein thrombosis involving the right small saphenous vein.  LEFT: - Findings consistent with acute deep vein thrombosis involving the left common femoral vein, SF junction, left femoral vein, left proximal profunda vein, left popliteal vein, and left posterior tibial veins. - Findings consistent with chronic deep vein thrombosis involving the left peroneal veins. - Ultrasound characteristics of enlarged lymph nodes noted in the groin.  *See table(s) above for measurements and observations. Electronically signed by Sherald Hesshristopher Clark MD on 12/28/2019 at 2:19:51 PM.    Final     Procedures Procedures (including critical care time)  Medications Ordered in ED Medications  apixaban (ELIQUIS) tablet 5 mg (5 mg Oral Given 12/28/19 1829)  oxyCODONE-acetaminophen (PERCOCET/ROXICET) 5-325 MG per tablet 1 tablet (1 tablet Oral Given 12/28/19 1246)  apixaban (ELIQUIS) tablet 5 mg (5 mg Oral Given 12/28/19 1246)    ED Course  I have reviewed the triage vital signs and the nursing notes.  Pertinent labs & imaging results that were available during my care of the patient were reviewed by me and considered in my medical decision making (see chart for details).  Clinical Course as of Dec 27 2052  Sat Dec 28, 2019  1747 Doppler imaging indicates left proximal leg DVT extending into the pelvis.   [EW]  1747 Case discussed with  vascular surgery, Dr. Chestine Sporelark who came to the ED to evaluate the patient.  He will arrange to see the patient in the office, later next week, for further care and treatment.   [EW]    Clinical Course User Index [EW] Mancel BaleWentz, Macedonio Scallon, MD   MDM Rules/Calculators/A&P                           Patient Vitals for the past 24 hrs:  BP Temp Temp src Pulse Resp SpO2  12/28/19 1804 123/77 98.5 F (36.9 C) Oral 75 18 97 %  12/28/19 0856 131/87 98.2 F (36.8 C) Oral 95 20 100 %    8:54 PM Reevaluation with update and discussion. After initial assessment and treatment, an updated evaluation reveals at discharge she is calm comfortable.  5 discussed with patient all questions were answered. Mancel BaleElliott Jeslynn Hollander   Medical Decision Making:  This patient is presenting for evaluation of left leg pain, which does require a range of treatment options, and is a complaint that involves a high risk  of morbidity and mortality. The differential diagnoses include muscle strain, recurrent DVT, recurrent superficial thrombophlebitis. I decided to review old records, and in summary patient supposed to be on chronic anticoagulation but is not currently taking it.  Last encounter at the ED, 10/11/2019, at which time he had superficial thrombophlebitis and was restarted on his Eliquis..  I did not require additional historical information from anyone.  Venous imaging, Doppler, left leg-  Critical Interventions-clinical evaluation, laboratory testing, Doppler imaging, observation reassessment.  After These Interventions, the Patient was reevaluated and was found stable for discharge.  Patient's been seen in the ED by vascular surgery who will arrange to manage his DVT in the outpatient setting.  No indication for hospitalization at this time.  CRITICAL CARE-no Performed by: Mancel Bale  Nursing Notes Reviewed/ Care Coordinated Applicable Imaging Reviewed Interpretation of Laboratory Data incorporated into ED  treatment  The patient appears reasonably screened and/or stabilized for discharge and I doubt any other medical condition or other [name] requiring further screening, evaluation, or treatment in the ED at this time prior to discharge.  Plan: Home Medications-restart Eliquis, tomorrow morning; Home Treatments-elevate left leg for comfort; return here if the recommended treatment, does not improve the symptoms; Recommended follow up-vascular surgery follow-up as soon as possible.     Final Clinical Impression(s) / ED Diagnoses Final diagnoses:  DVT, lower extremity, proximal, acute, left (HCC)    Rx / DC Orders ED Discharge Orders         Ordered    apixaban (ELIQUIS) 5 MG TABS tablet        12/28/19 1736           Mancel Bale, MD 12/28/19 2055

## 2019-12-28 NOTE — Progress Notes (Signed)
VASCULAR LAB    Left lower extremity and IVC/Iliac duplex has been performed.  See CV proc for preliminary results.  Gave report to Dr. Georgana Curio, [facility], RVT 12/28/2019, 1:40 PM

## 2019-12-28 NOTE — Discharge Instructions (Addendum)
You have a blood clot in the left leg, requiring treatment with Eliquis.  Follow-up with Dr. Chestine Spore as planned for further care and treatment.

## 2019-12-28 NOTE — ED Notes (Signed)
Patient Alert and oriented to baseline. Stable and ambulatory to baseline. Patient verbalized understanding of the discharge instructions.  Patient belongings were taken by the patient.   

## 2019-12-28 NOTE — ED Triage Notes (Signed)
Pt. Stated, Ive had ledft leg pain since Friday. I was driving a lot on Thursday.

## 2019-12-28 NOTE — ED Triage Notes (Signed)
Pt. Stated, I take Elaquis and have not taken it.

## 2019-12-29 ENCOUNTER — Inpatient Hospital Stay (HOSPITAL_COMMUNITY)
Admission: EM | Admit: 2019-12-29 | Discharge: 2020-01-01 | DRG: 176 | Disposition: A | Discharge status: Home / Self Care | Payer: 59 | Attending: Internal Medicine | Admitting: Internal Medicine

## 2019-12-29 DIAGNOSIS — Z95828 Presence of other vascular implants and grafts: Secondary | ICD-10-CM

## 2019-12-29 DIAGNOSIS — I82422 Acute embolism and thrombosis of left iliac vein: Secondary | ICD-10-CM | POA: Diagnosis present

## 2019-12-29 DIAGNOSIS — I82432 Acute embolism and thrombosis of left popliteal vein: Secondary | ICD-10-CM | POA: Diagnosis present

## 2019-12-29 DIAGNOSIS — I2694 Multiple subsegmental pulmonary emboli without acute cor pulmonale: Principal | ICD-10-CM | POA: Diagnosis present

## 2019-12-29 DIAGNOSIS — Z20822 Contact with and (suspected) exposure to covid-19: Secondary | ICD-10-CM | POA: Diagnosis present

## 2019-12-29 DIAGNOSIS — R0902 Hypoxemia: Secondary | ICD-10-CM | POA: Diagnosis present

## 2019-12-29 DIAGNOSIS — I2699 Other pulmonary embolism without acute cor pulmonale: Secondary | ICD-10-CM | POA: Diagnosis present

## 2019-12-29 DIAGNOSIS — I82552 Chronic embolism and thrombosis of left peroneal vein: Secondary | ICD-10-CM | POA: Diagnosis present

## 2019-12-29 DIAGNOSIS — I82412 Acute embolism and thrombosis of left femoral vein: Secondary | ICD-10-CM | POA: Diagnosis present

## 2019-12-29 DIAGNOSIS — F17210 Nicotine dependence, cigarettes, uncomplicated: Secondary | ICD-10-CM | POA: Diagnosis present

## 2019-12-29 DIAGNOSIS — D6851 Activated protein C resistance: Secondary | ICD-10-CM | POA: Diagnosis present

## 2019-12-29 DIAGNOSIS — Z716 Tobacco abuse counseling: Secondary | ICD-10-CM

## 2019-12-29 DIAGNOSIS — Z7901 Long term (current) use of anticoagulants: Secondary | ICD-10-CM

## 2019-12-29 DIAGNOSIS — I878 Other specified disorders of veins: Secondary | ICD-10-CM | POA: Diagnosis present

## 2019-12-29 DIAGNOSIS — I82811 Embolism and thrombosis of superficial veins of right lower extremities: Secondary | ICD-10-CM | POA: Diagnosis present

## 2019-12-29 DIAGNOSIS — J9811 Atelectasis: Secondary | ICD-10-CM | POA: Diagnosis present

## 2019-12-29 DIAGNOSIS — Z86711 Personal history of pulmonary embolism: Secondary | ICD-10-CM

## 2019-12-29 DIAGNOSIS — Z86718 Personal history of other venous thrombosis and embolism: Secondary | ICD-10-CM

## 2019-12-29 DIAGNOSIS — F1290 Cannabis use, unspecified, uncomplicated: Secondary | ICD-10-CM | POA: Diagnosis present

## 2019-12-29 DIAGNOSIS — Z7902 Long term (current) use of antithrombotics/antiplatelets: Secondary | ICD-10-CM

## 2019-12-29 DIAGNOSIS — J90 Pleural effusion, not elsewhere classified: Secondary | ICD-10-CM | POA: Diagnosis present

## 2019-12-29 DIAGNOSIS — I82442 Acute embolism and thrombosis of left tibial vein: Secondary | ICD-10-CM | POA: Diagnosis present

## 2019-12-29 DIAGNOSIS — I252 Old myocardial infarction: Secondary | ICD-10-CM

## 2019-12-29 DIAGNOSIS — D6859 Other primary thrombophilia: Secondary | ICD-10-CM | POA: Diagnosis present

## 2019-12-29 DIAGNOSIS — K219 Gastro-esophageal reflux disease without esophagitis: Secondary | ICD-10-CM | POA: Diagnosis present

## 2019-12-29 DIAGNOSIS — D6852 Prothrombin gene mutation: Secondary | ICD-10-CM | POA: Diagnosis present

## 2019-12-30 ENCOUNTER — Emergency Department (HOSPITAL_COMMUNITY): Payer: 59

## 2019-12-30 ENCOUNTER — Encounter (HOSPITAL_COMMUNITY): Payer: Self-pay | Admitting: Emergency Medicine

## 2019-12-30 ENCOUNTER — Other Ambulatory Visit: Payer: Self-pay

## 2019-12-30 ENCOUNTER — Encounter: Payer: 59 | Admitting: Internal Medicine

## 2019-12-30 DIAGNOSIS — I2699 Other pulmonary embolism without acute cor pulmonale: Secondary | ICD-10-CM

## 2019-12-30 HISTORY — DX: Other pulmonary embolism without acute cor pulmonale: I26.99

## 2019-12-30 LAB — BASIC METABOLIC PANEL
Anion gap: 13 (ref 5–15)
BUN: 10 mg/dL (ref 6–20)
CO2: 28 mmol/L (ref 22–32)
Calcium: 9.5 mg/dL (ref 8.9–10.3)
Chloride: 97 mmol/L — ABNORMAL LOW (ref 98–111)
Creatinine, Ser: 1 mg/dL (ref 0.61–1.24)
GFR calc Af Amer: >60 mL/min (ref >60)
GFR calc non Af Amer: >60 mL/min (ref >60)
Glucose, Bld: 97 mg/dL (ref 70–99)
Potassium: 3.7 mmol/L (ref 3.5–5.1)
Sodium: 138 mmol/L (ref 135–145)

## 2019-12-30 LAB — CBC
HCT: 46.8 % (ref 39.0–52.0)
Hemoglobin: 16 g/dL (ref 13.0–17.0)
MCH: 32.3 pg (ref 26.0–34.0)
MCHC: 34.2 g/dL (ref 30.0–36.0)
MCV: 94.4 fL (ref 80.0–100.0)
Platelets: 259 10*3/uL (ref 150–400)
RBC: 4.96 MIL/uL (ref 4.22–5.81)
RDW: 12.6 % (ref 11.5–15.5)
WBC: 14.2 10*3/uL — ABNORMAL HIGH (ref 4.0–10.5)
nRBC: 0 % (ref 0.0–0.2)

## 2019-12-30 LAB — TROPONIN I (HIGH SENSITIVITY)
Troponin I (High Sensitivity): 7 ng/L (ref <18)
Troponin I (High Sensitivity): <2 ng/L (ref <18)

## 2019-12-30 MED ORDER — NICOTINE 14 MG/24HR TD PT24
14.0000 mg | MEDICATED_PATCH | Freq: Every day | TRANSDERMAL | Status: DC
  Administered 2019-12-30: 14 mg via TRANSDERMAL
  Filled 2019-12-30 (×2): qty 1

## 2019-12-30 MED ORDER — APIXABAN 5 MG PO TABS
10.0000 mg | ORAL_TABLET | Freq: Two times a day (BID) | ORAL | Status: DC
  Administered 2019-12-30 – 2020-01-01 (×5): 10 mg via ORAL
  Filled 2019-12-30 (×5): qty 2

## 2019-12-30 MED ORDER — HEPARIN (PORCINE) 25000 UT/250ML-% IV SOLN
1600.0000 [IU]/h | INTRAVENOUS | Status: DC
  Administered 2019-12-30: 1600 [IU]/h via INTRAVENOUS
  Filled 2019-12-30: qty 250

## 2019-12-30 MED ORDER — IOHEXOL 350 MG/ML SOLN
80.0000 mL | Freq: Once | INTRAVENOUS | Status: AC | PRN
  Administered 2019-12-30: 80 mL via INTRAVENOUS

## 2019-12-30 MED ORDER — ACETAMINOPHEN 500 MG PO TABS
1000.0000 mg | ORAL_TABLET | Freq: Once | ORAL | Status: AC
  Administered 2019-12-30: 1000 mg via ORAL
  Filled 2019-12-30: qty 2

## 2019-12-30 MED ORDER — APIXABAN 5 MG PO TABS: 5.0000 mg | ORAL_TABLET | Freq: Two times a day (BID) | ORAL | Status: DC

## 2019-12-30 MED ORDER — OXYCODONE HCL 5 MG PO TABS
5.0000 mg | ORAL_TABLET | ORAL | Status: DC | PRN
  Administered 2019-12-30 – 2020-01-01 (×7): 5 mg via ORAL
  Filled 2019-12-30 (×7): qty 1

## 2019-12-30 MED ORDER — ACETAMINOPHEN 325 MG PO TABS
650.0000 mg | ORAL_TABLET | Freq: Four times a day (QID) | ORAL | Status: DC | PRN
  Administered 2019-12-30 – 2019-12-31 (×2): 650 mg via ORAL
  Filled 2019-12-30 (×2): qty 2

## 2019-12-30 MED ORDER — HEPARIN BOLUS VIA INFUSION
3000.0000 [IU] | Freq: Once | INTRAVENOUS | Status: AC
  Administered 2019-12-30: 3000 [IU] via INTRAVENOUS
  Filled 2019-12-30: qty 3000

## 2019-12-30 MED ORDER — OXYCODONE HCL 5 MG PO TABS
5.0000 mg | ORAL_TABLET | Freq: Once | ORAL | Status: AC
  Administered 2019-12-30: 5 mg via ORAL
  Filled 2019-12-30: qty 1

## 2019-12-30 MED ORDER — ACETAMINOPHEN 650 MG RE SUPP: 650.0000 mg | Freq: Four times a day (QID) | RECTAL | Status: DC | PRN

## 2019-12-30 NOTE — ED Provider Notes (Signed)
[facility] EMERGENCY DEPARTMENT Provider Note  CSN: [id number] Arrival date & time: 12/29/19 2346  Chief Complaint(s) Shortness of Breath and Chest Pain  HPI [name] [name] is a 39 y.o. male with a past medical history listed below including factor V Leyden mutation, prior DVT/PEs with an IVC filter, who was seen yesterday and diagnosed with a new left leg DVT.  Patient was supposed to be on Eliquis but had run out and been off of it for 6 weeks.  He was placed back on his Eliquis yesterday and reports taking his medication.  He returns today for left upper back pain which he noted this morning upon awakening.  Pain is severe and worse with deep breathing and range of motion of the left shoulder.  He denies any trauma.  No chest pain.  He endorses shortness of breath due to the pain.  Denies any other physical complaints.  HPI  Past Medical History Past Medical History:  Diagnosis Date  . Anxiety and depression   . DVT (deep venous thrombosis) (HCC)    Pt unsure of date   . Factor V Leiden mutation (HCC)    With resultant hypercoaguable state, Hx of DVT, PE, SP IVC filter placement, on chronic coumadin and requires high doses to maintine IRN 2-3.  Marland Kitchen GERD (gastroesophageal reflux disease)    history of  . Myocardial infarction (HCC)   . PE (pulmonary thromboembolism) (HCC)    unsure of date  . Pneumonia   . Pneumothorax 8/11   HX of, requiring chest tube/hospitalization.   . Venous stasis    bilateral LE.   Patient Active Problem List   Diagnosis Date Noted  . Factor V Leiden, prothrombin gene mutation (HCC) 04/25/2018  . Diverticulitis of colon with perforation 04/22/2018  . Long term current use of anticoagulant 05/09/2010  . Venous stasis dermatitis of both lower extremities 10/28/2008  . PRIMARY HYPERCOAGULABLE STATE 12/18/2007  . Acute thromboembolism of deep veins of lower extremity (HCC) 12/13/2007   Home Medication(s) Prior to Admission medications    Medication Sig Start Date End Date Taking? Authorizing Provider  acetaminophen (TYLENOL) 325 MG tablet Take 2 tablets (650 mg total) by mouth every 6 (six) hours as needed for mild pain (or temp > 100). 04/26/18  Yes Sherrie George, PA-C  apixaban (ELIQUIS) 5 MG TABS tablet 10 mg twice each day, for 21 days, then 5 mg twice a day. Patient taking differently: Take 5 mg by mouth 2 (two) times daily.  12/28/19  Yes Mancel Bale, MD  enoxaparin (LOVENOX) 120 MG/0.8ML injection Inject 0.76 mLs (115 mg total) into the skin every 12 (twelve) hours. 04/26/18 10/11/19  Sherrie George, PA-C  warfarin (COUMADIN) 10 MG tablet Take 2 tablets (20 mg total) by mouth daily. 04/26/18 10/11/19  Sherrie George, PA-C  Past Surgical History Past Surgical History:  Procedure Laterality Date  . ANGIOPLASTY ILLIAC ARTERY Right 07/11/2016   Procedure: balloon ANGIOPLASTY of Inferior vena cava, right external iliac vein, right common femoral vein;  Surgeon: Maeola Harman, MD;  Location: [facility] OR;  Service: Vascular;  Laterality: Right;  . INTRAVASCULAR ULTRASOUND/IVUS Right 07/11/2016   Procedure: INTRAVASCULAR ULTRASOUND/IVUS of right popliteal vein, right common femoral vein, right femoral vein, right exterternal iliac vein, and right internal iliac vein;  Surgeon: Maeola Harman, MD;  Location: [facility] OR;  Service: Vascular;  Laterality: Right;  . LOWER EXTREMITY ANGIOGRAPHY N/A 07/12/2016   Procedure: LYSIS RECHECK;  Surgeon: Nada Libman, MD;  Location: [facility] INVASIVE CV LAB;  Service: Cardiovascular;  Laterality: N/A;  . placement of IVC filter  8/09  . Placement of large-bore right chest tube  8/11  . right minithoracotomy with evacuation of hematoma  8/09  . ULTRASOUND GUIDANCE FOR VASCULAR ACCESS Right 07/11/2016   Procedure: ULTRASOUND GUIDANCE FOR VASCULAR ACCESS;   Surgeon: Maeola Harman, MD;  Location: [facility] OR;  Service: Vascular;  Laterality: Right;   Family History No family history on file.  Social History Social History   Tobacco Use  . Smoking status: Former Smoker    Packs/day: 1.00    Years: 20.00    Pack years: 20.00    Types: Cigarettes    Start date: 07/07/2016  . Smokeless tobacco: Former Neurosurgeon  . Tobacco comment: 1/2/pak per day  Substance Use Topics  . Alcohol use: No  . Drug use: Yes    Frequency: 1.0 times per week    Types: Marijuana    Comment: last  07/07/16   Allergies Patient has no known allergies.  Review of Systems Review of Systems All other systems are reviewed and are negative for acute change except as noted in the HPI  Physical Exam Vital Signs  I have reviewed the triage vital signs BP 132/78 (BP Location: Left Arm)   Pulse 89   Temp 99 F (37.2 C) (Oral)   Resp 16   Ht 6' (1.829 m)   Wt 113.4 kg   SpO2 93%   BMI 33.91 kg/m   Physical Exam Vitals reviewed.  Constitutional:      General: He is not in acute distress.    Appearance: He is well-developed. He is not diaphoretic.  HENT:     Head: Normocephalic and atraumatic.     Nose: Nose normal.  Eyes:     General: No scleral icterus.       Right eye: No discharge.        Left eye: No discharge.     Conjunctiva/sclera: Conjunctivae normal.     Pupils: Pupils are equal, round, and reactive to light.  Cardiovascular:     Rate and Rhythm: Normal rate and regular rhythm.     Heart sounds: No murmur heard.  No friction rub. No gallop.   Pulmonary:     Effort: Pulmonary effort is normal. No respiratory distress.     Breath sounds: Normal breath sounds. No stridor. No rales.  Abdominal:     General: There is no distension.     Palpations: Abdomen is soft.     Tenderness: There is no abdominal tenderness.  Musculoskeletal:     Cervical back: Normal range of motion and neck supple.     Thoracic back: Spasms and tenderness present.         Back:  Skin:    General: Skin  is warm and dry.     Findings: No erythema or rash.  Neurological:     Mental Status: He is alert and oriented to person, place, and time.     ED Results and Treatments Labs (all labs ordered are listed, but only abnormal results are displayed) Labs Reviewed  BASIC METABOLIC PANEL - Abnormal; Notable for the following components:      Result Value   Chloride 97 (*)    All other components within normal limits  CBC - Abnormal; Notable for the following components:   WBC 14.2 (*)    All other components within normal limits  TROPONIN I (HIGH SENSITIVITY)  TROPONIN I (HIGH SENSITIVITY)                                                                                                                         EKG  EKG Interpretation  Date/Time:  Sunday December 29 2019 23:56:28 EDT Ventricular Rate:  95 PR Interval:  150 QRS Duration: 80 QT Interval:  318 QTC Calculation: 399 R Axis:   73 Text Interpretation: Normal sinus rhythm Normal ECG When compared with ECG of 01/26/2011, No significant change was found Reconfirmed by Drema Pry [phone]) on 12/30/2019 12:58:57 AM      Radiology DG Chest 2 View  Result Date: 12/30/2019 CLINICAL DATA:  Chest pain and shortness of breath. EXAM: CHEST - 2 VIEW COMPARISON:  October 12, 2016 FINDINGS: A trace amount of atelectasis is seen within the bilateral lung bases. There is no evidence of acute infiltrate, pleural effusion or pneumothorax. The heart size and mediastinal contours are within normal limits. The visualized skeletal structures are unremarkable. IMPRESSION: No active cardiopulmonary disease. Electronically Signed   By: Aram Candela M.D.   On: 12/30/2019 01:17   CT Angio Chest PE W and/or Wo Contrast  Result Date: 12/30/2019 CLINICAL DATA:  Extreme pain between the shoulder blades EXAM: CT ANGIOGRAPHY CHEST WITH CONTRAST TECHNIQUE: Multidetector CT imaging of the chest was performed using  the standard protocol during bolus administration of intravenous contrast. Multiplanar CT image reconstructions and MIPs were obtained to evaluate the vascular anatomy. CONTRAST:  65mL OMNIPAQUE IOHEXOL 350 MG/ML SOLN COMPARISON:  Chest x-ray 12/30/2019, CT chest 01/26/2011 FINDINGS: Cardiovascular: Satisfactory opacification of the pulmonary arteries to the segmental level. Small subsegmental right lower lobe emboli. Left upper lobe segmental and subsegmental emboli. Acute thrombus within descending left lower lobar artery with thrombus extending into multiple segmental and subsegmental left lower lobe pulmonary vessels. Cardiac size within normal limits. No right heart strain, RV LV ratio is 0.8. Nonaneurysmal aorta. Mediastinum/Nodes: No enlarged mediastinal, hilar, or axillary lymph nodes. Thyroid gland, trachea, and esophagus demonstrate no significant findings. Lungs/Pleura: No acute consolidation or pleural effusion. Hazy density in the left lung base probably atelectasis. Trace left pleural effusion Upper Abdomen: No acute abnormality. Musculoskeletal: No acute osseous abnormality. Osseous bar between the right fifth and sixth ribs. Review of the MIP images confirms the above findings. IMPRESSION:  1. Positive for left greater than right bilateral acute pulmonary emboli. Negative for right heart strain. 2. Trace left pleural effusion. Hazy left lung base density probably reflects atelectasis. Critical Value/emergent results were called by telephone at the time of interpretation on 12/30/2019 at 3:15 am to provider Dr. Gerhard Perchesardema, Who verbally acknowledged these results. Electronically Signed   By: Jasmine PangKim  Fujinaga M.D.   On: 12/30/2019 03:15    Pertinent labs & imaging results that were available during my care of the patient were reviewed by me and considered in my medical decision making (see chart for details).  Medications Ordered in ED Medications  iohexol (OMNIPAQUE) 350 MG/ML injection 80 mL (80 mLs  Intravenous Contrast Given 12/30/19 0223)  acetaminophen (TYLENOL) tablet 1,000 mg (1,000 mg Oral Given 12/30/19 0427)  oxyCODONE (Oxy IR/ROXICODONE) immediate release tablet 5 mg (5 mg Oral Given 12/30/19 0427)                                                                                                                                    Procedures .1-3 Lead EKG Interpretation Performed by: Nira Connardama, Asianae Minkler Eduardo, MD Authorized by: Nira Connardama, Marcellius Montagna Eduardo, MD     Interpretation: normal     ECG rate:  90   ECG rate assessment: normal     Rhythm: sinus rhythm     Ectopy: none     Conduction: normal   .Critical Care Performed by: Nira Connardama, Ireland Chagnon Eduardo, MD Authorized by: Nira Connardama, Shawntez Dickison Eduardo, MD    CRITICAL CARE Performed by: Amadeo GarnetPedro Eduardo Demitra Danley Total critical care time: 55 minutes Critical care time was exclusive of separately billable procedures and treating other patients. Critical care was necessary to treat or prevent imminent or life-threatening deterioration. Critical care was time spent personally by me on the following activities: development of treatment plan with patient and/or surrogate as well as nursing, discussions with consultants, evaluation of patient's response to treatment, examination of patient, obtaining history from patient or surrogate, ordering and performing treatments and interventions, ordering and review of laboratory studies, ordering and review of radiographic studies, pulse oximetry and re-evaluation of patient's condition.    (including critical care time)  Medical Decision Making / ED Course I have reviewed the nursing notes for this encounter and the patient's prior records (if available in EHR or on provided paperwork).   Louann Sjogrenric T Trull was evaluated in Emergency Department on 12/30/2019 for the symptoms described in the history of present illness. He was evaluated in the context of the global COVID-19 pandemic, which necessitated consideration  that the patient might be at risk for infection with the SARS-CoV-2 virus that causes COVID-19. Institutional protocols and algorithms that pertain to the evaluation of patients at risk for COVID-19 are in a state of rapid change based on information released by regulatory bodies including the CDC and federal and state organizations. These policies and algorithms were followed during the patient's care in the ED.  Work up revealed multiple subsegmental pulmonary emboli left greater than right.  No right heart strain.  Patient is currently hemodynamically stable.  Initially patient was satting well however his sats slowly declined.  Patient was given incentive spirometer and pain medicine, however his sats continued to drop down to the high 80s.  He was placed on 3 L nasal cannula and started on heparin.  We will discuss case with internal medicine for admission.      Final Clinical Impression(s) / ED Diagnoses Final diagnoses:  Multiple subsegmental pulmonary emboli without acute cor pulmonale (HCC)  Hypoxia      This chart was dictated using voice recognition software.  Despite best efforts to proofread,  errors can occur which can change the documentation meaning.   Nira Conn, MD 12/30/19 [phone]

## 2019-12-30 NOTE — Progress Notes (Signed)
ANTICOAGULATION CONSULT NOTE - Initial Consult  Pharmacy Consult for heparin Indication: pulmonary embolus and DVT  No Known Allergies  Patient Measurements: Height: 6' (182.9 cm) Weight: 113.4 kg (250 lb) IBW/kg (Calculated) : 77.6 Heparin Dosing Weight: 101.9kg  Vital Signs: Temp: 99 F (37.2 C) (09/13 0400) Temp Source: Oral (09/13 0400) BP: 132/78 (09/13 0400) Pulse Rate: 89 (09/13 0400)  Labs: Recent Labs    12/30/19 0010 12/30/19 0302  HGB 16.0  --   HCT 46.8  --   PLT 259  --   CREATININE 1.00  --   TROPONINIHS 7 <2    Estimated Creatinine Clearance: 126.4 mL/min (by C-G formula based on SCr of 1 mg/dL).   Medical History: Past Medical History:  Diagnosis Date   Anxiety and depression    DVT (deep venous thrombosis) (HCC)    Pt unsure of date    Factor V Leiden mutation (HCC)    With resultant hypercoaguable state, Hx of DVT, PE, SP IVC filter placement, on chronic coumadin and requires high doses to maintine IRN 2-3.   GERD (gastroesophageal reflux disease)    history of   Myocardial infarction [facility])    PE (pulmonary thromboembolism) (HCC)    unsure of date   Pneumonia    Pneumothorax 8/11   HX of, requiring chest tube/hospitalization.    Venous stasis    bilateral LE.    Medications:  Infusions:   heparin      Assessment: 39 yom presented to the ED with CP and SOB. He is on chronic apixaban for history of VTE and Factor V Leiden however, he had run out recently. He was restarted after an ED visit on 9/11 where an acute DVT was found. Now CT demonstrates a PE without right heart strain. To start IV heparin. Last apixaban dose was yesterday. Baseline CBC is WNL and no bleeding noted.   Goal of Therapy:  Heparin level 0.3-0.7 units/ml aPTT 66-102 seconds Monitor platelets by anticoagulation protocol: Yes   Plan:  Heparin bolus 3000 units - giving small bolus due to apixaban usage Heparin gtt 1600 units/hr Check an 8 hr heparin  level and aPTT Daily heparin level, aPTT and CBC  Betsaida [place], Drake Leach 12/30/2019,7:43 AM

## 2019-12-30 NOTE — ED Notes (Addendum)
Pt walked with pulse oximetry on and SPO2 stayed 93% on room air.

## 2019-12-30 NOTE — ED Notes (Signed)
Spoke to provider, who ordered CT. IV was placed, pt was taken to Xray then CT.

## 2019-12-30 NOTE — ED Notes (Signed)
Pt spike a temp of 101.1 This RN provided an ice pack and tylenol for pt

## 2019-12-30 NOTE — ED Triage Notes (Signed)
Pt believes that he has a blood clot in his back.  He was diagnosed w/ a blood clot yesterday and started on Elaquis and reports he has been taking it.  Pt states it hurts w/ every breath he takes.

## 2019-12-30 NOTE — H&P (Signed)
Date: 12/30/2019               Patient Name:  [name] [name] MRN: [id number]  DOB: Aug 21, 1980 Age / Sex: 39 y.o., male   PCP: Dolan Amen, MD         Medical Service: Internal Medicine Teaching Service         Attending Physician: Dr. Gust Rung, DO    First Contact: Dr. Laddie Aquas Pager: [phone]  Second Contact: Dr. Maryla Morrow Pager: [phone]       After Hours (After 5p/  First Contact Pager: [phone]  weekends / holidays): Second Contact Pager: [phone]   Chief Complaint: Back pain and shortness of breath  History of Present Illness: This is a 39 year old male with a history of factor V leiden, and a prior DVT/PE with IVC filter who was seen 2 days ago and diagnosed with a DVT, who presented with pleuritic back pain and worsening shortness of breath.  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  He went to the ER on Saturday and was diagnosed with a DVT and started on Eliquis.  He was also scheduled to follow-up with vascular surgery, has an appointment next week.  Since then he has been having some shortness of breath and fatigue, this morning he woke up with worsening shortness of breath, left upper back pain, and a productive cough with white/clear sputum.  He denies any chest pain, nausea, vomiting, fevers, chills, change in appetite, headaches, lightheadedness, dizziness.  He does endorse smoking 1 pack/day.  He was actually supposed to follow-up with the IM TS clinic today however presented to the ER because of his symptoms.  In the ER patient noted to be afebrile, hypoxic with ambulation requiring 2 L nasal cannula, BP and heart rate are stable.  Labs showed an unremarkable BMP.  CBC showed WBC 14.   Troponins were flat.  CT a showed findings of left greater than right bilateral acute pulmonary emboli, negative for right heart strain, trace left pleural effusion, hazy left lung base density probably reflecting atelectasis.  Chest x-ray unremarkable.  EKG showed no findings of right heart strain.  Initially planning on discharging from ER however given the persistent hypoxia patient was admitted to [facility].  Started on heparin drip.  Meds:  Current Meds  Medication Sig  . acetaminophen (TYLENOL) 325 MG tablet Take 2 tablets (650 mg total) by mouth every 6 (six) hours as needed for mild pain (or temp > 100).  Marland Kitchen apixaban (ELIQUIS) 5 MG TABS tablet 10 mg twice each day, for 21 days, then 5 mg twice a day. (Patient taking differently: Take 5 mg by mouth 2 (two) times daily. )     Allergies: Allergies as of 12/29/2019  . (No Known Allergies)   Past Medical History:  Diagnosis Date  . Anxiety and depression   . DVT (deep venous thrombosis) (HCC)    Pt unsure of date   . Factor V Leiden mutation (HCC)    With resultant hypercoaguable state, Hx of DVT, PE, SP IVC filter placement, on chronic coumadin and requires high doses to maintine IRN 2-3.  Marland Kitchen GERD (gastroesophageal reflux  disease)    history of  . Myocardial infarction (HCC)   . PE (pulmonary thromboembolism) (HCC)    unsure of date  . Pneumonia   . Pneumothorax 8/11   HX of, requiring chest tube/hospitalization.   . Venous stasis    bilateral LE.    Family History: Unknown, patient reports being adopted.   Social History: Smokes 1 ppd, denies EtOH use. Reports occasional marijuana use.  Works as a Naval architect.  Lives in [place] with his significant other and kids.  Review of Systems: A complete ROS was negative except as per HPI.   Physical Exam: Blood pressure 110/64, pulse 70, temperature 99 F (37.2 C), temperature source Oral, resp. rate 20, height 6' (1.829 m), weight 113.4 kg, SpO2 94 %. Physical  Exam Constitutional:      Appearance: He is well-developed.     Comments: Tired appearing  HENT:     Head: Normocephalic and atraumatic.     Mouth/Throat:     Mouth: Mucous membranes are moist.     Pharynx: Oropharynx is clear.  Cardiovascular:     Rate and Rhythm: Normal rate and regular rhythm.  Pulmonary:     Effort: Pulmonary effort is normal.     Breath sounds: Normal breath sounds. No decreased breath sounds, wheezing, rhonchi or rales.  Chest:     Chest wall: Mass and tenderness (TTP over left chest wall) present.  Abdominal:     General: Bowel sounds are normal.     Palpations: Abdomen is soft. There is no hepatomegaly or splenomegaly.  Musculoskeletal:        General: Normal range of motion.  Skin:    General: Skin is warm and dry.     Capillary Refill: Capillary refill takes less than 2 seconds.  Neurological:     General: No focal deficit present.     Mental Status: He is alert.  Psychiatric:        Mood and Affect: Mood normal.        Behavior: Behavior normal.      EKG: personally reviewed my interpretation is normal sinus rhythm, heart rate 95, no QT prolongation, no acute ST changes  CXR: personally reviewed my interpretation is no acute cardiopulmonary disease  CTA Chest: IMPRESSION: 1. Positive for left greater than right bilateral acute pulmonary emboli. Negative for right heart strain. 2. Trace left pleural effusion. Hazy left lung base density probably reflects atelectasis.  Assessment & Plan by Problem:  Active Problems:   Pulmonary embolism (HCC) This is a 39 year old male with a history of factor V leiden, and a prior DVT/PE with IVC filter who was seen 2 days ago and diagnosed with a DVT, who presented with pleuritic back pain and worsening shortness of breath.  Patient was noted to be hypoxic requiring 2 L nasal cannula, otherwise hemodynamically stable.  Mild leukocytosis.  EKG showed no findings of right heart strain.  CTA showed bilateral  PE, with the left greater than right.  Negative for right heart strain.  Given the hypoxia patient was started on IV heparin.  Since he is currently hemodynamically stable we will wean off oxygen and transition to Eliquis.  Patient had only been on Eliquis for 1 day for his DVT, unclear if this is Eliquis failure or if the PE may have been present when he was seen on 9/11.  He does have a IVC filter in place, consider consulting vascular to see if this needs to be replaced.   -  Continue supplemental oxygen as needed, wean as tolerated -Transition from heparin drip to Eliquis -Cardiac telemetry -Patient will follow up with IM TS clinic on discharge  History of left lower extremity DVT: On heparin drip and transitioning to Eliquis as above, he already has follow-up with vascular surgery scheduled.  On exam no significant tenderness to palpation, no erythema, redness, or warmth noted.  We will continue to monitor and consult vascular as needed.  Tobacco use: Patient smokes about half pack per day, discussed smoking cessation.  Will need to continue to encourage smoking cessation. -Nicotine patch  Dispo: Admit patient to Observation with expected length of stay less than 2 midnights.  Signed: Claudean Severance, MD 12/30/2019, 10:09 AM  Pager: [phone] After 5pm on weekdays and 1pm on weekends: On Call pager: [phone]

## 2019-12-31 ENCOUNTER — Encounter (HOSPITAL_COMMUNITY): Payer: Self-pay | Admitting: Internal Medicine

## 2019-12-31 DIAGNOSIS — I2699 Other pulmonary embolism without acute cor pulmonale: Secondary | ICD-10-CM

## 2019-12-31 DIAGNOSIS — F17210 Nicotine dependence, cigarettes, uncomplicated: Secondary | ICD-10-CM | POA: Diagnosis present

## 2019-12-31 DIAGNOSIS — J90 Pleural effusion, not elsewhere classified: Secondary | ICD-10-CM | POA: Diagnosis present

## 2019-12-31 DIAGNOSIS — K219 Gastro-esophageal reflux disease without esophagitis: Secondary | ICD-10-CM | POA: Diagnosis present

## 2019-12-31 DIAGNOSIS — Z95828 Presence of other vascular implants and grafts: Secondary | ICD-10-CM | POA: Diagnosis not present

## 2019-12-31 DIAGNOSIS — Z86711 Personal history of pulmonary embolism: Secondary | ICD-10-CM | POA: Diagnosis not present

## 2019-12-31 DIAGNOSIS — I82412 Acute embolism and thrombosis of left femoral vein: Secondary | ICD-10-CM | POA: Diagnosis present

## 2019-12-31 DIAGNOSIS — D6859 Other primary thrombophilia: Secondary | ICD-10-CM | POA: Diagnosis present

## 2019-12-31 DIAGNOSIS — J9811 Atelectasis: Secondary | ICD-10-CM | POA: Diagnosis present

## 2019-12-31 DIAGNOSIS — I82552 Chronic embolism and thrombosis of left peroneal vein: Secondary | ICD-10-CM | POA: Diagnosis present

## 2019-12-31 DIAGNOSIS — F1290 Cannabis use, unspecified, uncomplicated: Secondary | ICD-10-CM | POA: Diagnosis present

## 2019-12-31 DIAGNOSIS — I2694 Multiple subsegmental pulmonary emboli without acute cor pulmonale: Secondary | ICD-10-CM | POA: Diagnosis present

## 2019-12-31 DIAGNOSIS — I82402 Acute embolism and thrombosis of unspecified deep veins of left lower extremity: Secondary | ICD-10-CM

## 2019-12-31 DIAGNOSIS — Z20822 Contact with and (suspected) exposure to covid-19: Secondary | ICD-10-CM | POA: Diagnosis present

## 2019-12-31 DIAGNOSIS — Z7902 Long term (current) use of antithrombotics/antiplatelets: Secondary | ICD-10-CM | POA: Diagnosis not present

## 2019-12-31 DIAGNOSIS — D6852 Prothrombin gene mutation: Secondary | ICD-10-CM | POA: Diagnosis present

## 2019-12-31 DIAGNOSIS — Z716 Tobacco abuse counseling: Secondary | ICD-10-CM | POA: Diagnosis not present

## 2019-12-31 DIAGNOSIS — R0902 Hypoxemia: Secondary | ICD-10-CM | POA: Diagnosis present

## 2019-12-31 DIAGNOSIS — I82422 Acute embolism and thrombosis of left iliac vein: Secondary | ICD-10-CM | POA: Diagnosis present

## 2019-12-31 DIAGNOSIS — Z86718 Personal history of other venous thrombosis and embolism: Secondary | ICD-10-CM | POA: Diagnosis not present

## 2019-12-31 DIAGNOSIS — D6851 Activated protein C resistance: Secondary | ICD-10-CM | POA: Diagnosis present

## 2019-12-31 DIAGNOSIS — I82811 Embolism and thrombosis of superficial veins of right lower extremities: Secondary | ICD-10-CM | POA: Diagnosis present

## 2019-12-31 DIAGNOSIS — I82442 Acute embolism and thrombosis of left tibial vein: Secondary | ICD-10-CM | POA: Diagnosis present

## 2019-12-31 DIAGNOSIS — I878 Other specified disorders of veins: Secondary | ICD-10-CM | POA: Diagnosis present

## 2019-12-31 DIAGNOSIS — Z7901 Long term (current) use of anticoagulants: Secondary | ICD-10-CM | POA: Diagnosis not present

## 2019-12-31 DIAGNOSIS — I82432 Acute embolism and thrombosis of left popliteal vein: Secondary | ICD-10-CM | POA: Diagnosis present

## 2019-12-31 LAB — BASIC METABOLIC PANEL
Anion gap: 10 (ref 5–15)
BUN: 12 mg/dL (ref 6–20)
CO2: 25 mmol/L (ref 22–32)
Calcium: 9.2 mg/dL (ref 8.9–10.3)
Chloride: 98 mmol/L (ref 98–111)
Creatinine, Ser: 0.74 mg/dL (ref 0.61–1.24)
GFR calc Af Amer: >60 mL/min (ref >60)
GFR calc non Af Amer: >60 mL/min (ref >60)
Glucose, Bld: 111 mg/dL — ABNORMAL HIGH (ref 70–99)
Potassium: 4 mmol/L (ref 3.5–5.1)
Sodium: 133 mmol/L — ABNORMAL LOW (ref 135–145)

## 2019-12-31 LAB — CBC
HCT: 42.7 % (ref 39.0–52.0)
Hemoglobin: 14.6 g/dL (ref 13.0–17.0)
MCH: 31.6 pg (ref 26.0–34.0)
MCHC: 34.2 g/dL (ref 30.0–36.0)
MCV: 92.4 fL (ref 80.0–100.0)
Platelets: 227 10*3/uL (ref 150–400)
RBC: 4.62 MIL/uL (ref 4.22–5.81)
RDW: 12.5 % (ref 11.5–15.5)
WBC: 14.3 10*3/uL — ABNORMAL HIGH (ref 4.0–10.5)
nRBC: 0 % (ref 0.0–0.2)

## 2019-12-31 LAB — HEPARIN LEVEL (UNFRACTIONATED): Heparin Unfractionated: >2.2 IU/mL — ABNORMAL HIGH (ref 0.30–0.70)

## 2019-12-31 LAB — HIV ANTIBODY (ROUTINE TESTING W REFLEX): HIV Screen 4th Generation wRfx: NONREACTIVE

## 2019-12-31 LAB — SARS CORONAVIRUS 2 BY RT PCR (HOSPITAL ORDER, PERFORMED IN ~~LOC~~ HOSPITAL LAB): SARS Coronavirus 2: NEGATIVE

## 2019-12-31 MED ORDER — POLYETHYLENE GLYCOL 3350 17 G PO PACK
17.0000 g | PACK | Freq: Every day | ORAL | Status: DC | PRN
  Administered 2019-12-31: 17 g via ORAL
  Filled 2019-12-31: qty 1

## 2019-12-31 NOTE — Plan of Care (Signed)

## 2019-12-31 NOTE — Progress Notes (Cosign Needed Addendum)
Pt. Cough up scant red blood.

## 2019-12-31 NOTE — Progress Notes (Signed)
Subjective:  [name] [name] reports that he is still short of breath and that "it hurts to breathe." His pleuritic pain is worst in his left lower chest. He has no other complaints at this time.  Nursing staff O2 challenged him earlier this morning and he became hypoxic and short of breath with walking to the bathroom. He was placed on 2 L via nasal cannula. We discussed his ability to obtain and adhere to his outpatient anticoagulant regimen. He says that his girlfriend handles his meds for him and that he had been off of Eliquis at least four weeks prior to this hospitalization. He prefers to stay on Eliquis vs. switching to Xarelto for once-daily dosing because Eliquis is cheaper for him.  He feels that the nicotine patch is very helpful. He voices a desire to quit smoking and states that this hospitalization is a motivating factor for him. He says that his girlfriend is very supportive of him quitting.   Objective:  Vital signs in last 24 hours: Vitals:   12/30/19 2204 12/30/19 2331 12/31/19 0500 12/31/19 0741  BP: 131/81 139/78  128/80  Pulse: (!) 102 91  96  Resp: 19 20  20   Temp: 99.2 F (37.3 C) 98.8 F (37.1 C)  100 F (37.8 C)  TempSrc: Axillary Axillary  Oral  SpO2: 100% 98%  98%  Weight:   97.6 kg   Height:       Weight change: -15.8 kg  Intake/Output Summary (Last 24 hours) at 12/31/2019 1303 Last data filed at 12/31/2019 0709 Gross per 24 hour  Intake 120 ml  Output 700 ml  Net -580 ml   Physical Exam Vitals and nursing note reviewed.  Constitutional:      Interventions: Nasal cannula in place.     Comments: (2L [place])  Cardiovascular:     Rate and Rhythm: Normal rate and regular rhythm.     Pulses:          Dorsalis pedis pulses are 2+ on the right side and 2+ on the left side.       Posterior tibial pulses are 2+ on the right side and 2+ on the left side.     Heart sounds: Normal heart sounds.     Comments: Chronic venous stasis dermatitis bilaterally in LE  with limited non-tender edema bilaterally Pulmonary:     Effort: Respiratory distress present.     Breath sounds: Normal breath sounds.     Comments: Able to speak in full sentences with occasional pauses. Musculoskeletal:     Right lower leg: No tenderness.     Left lower leg: No tenderness.  Neurological:     Mental Status: He is alert.     Assessment/Plan:  [name] [name] is a 39 year old male with a history of Factor V leiden, prior DVT/PE with IVC filter who is being treated for a L Common Iliac DVT and multiple subsegmental pulmonary emboli.   Active Problems:   Pulmonary embolism (HCC)  Pulmonary embolism: Patient with Factor V Leiden presented with SOB and pleuritic CP after several weeks off of home Eliquis.  CTA with multiple subsegmental PEs bilaterally. CXR nl and lungs clear on exam. Eliquis restarted. He is still dyspneic and requiring 2L nasal cannula. He is otherwise hemodynamically stable with a mild leukocytosis. No COVID test in lab results tab. - Supplemental O2 as needed. Wean as tolerated.  - Cardiac telemetry - Eliquis 10mg  BID for one week, then 5 mg BID indefinitely -  COVID Swab today to rule out infection contributing to his symptoms - Outpatient follow-up with our clinic.   Left lower extremity DVT: Patient with no erythema, warmth, or tenderness on exam. Day 4 of anticoagulation. - Scheduled to follow up with vascular surgery - Continue Eliquis   Tobacco use: Patient smokes half pack per day. Motivated to quit with good support at home. - Nicotine patch. - Outpatient follow-up to support cessation efforts.    LOS: 0 days   Dorothyann Gibbs, Medical Student 12/31/2019, 1:03 PM

## 2019-12-31 NOTE — Progress Notes (Cosign Needed)
Pt. Was sitting on side of bed with feet flat on ground. I noticed his feet were bluish. I checked the pulses and the cap refill. Pulse dorsalis pedis is 2+ and refill less than 3. Recommended that pt. Keep feet elevated in the bed.

## 2019-12-31 NOTE — Hospital Course (Addendum)
Pulmonary emboli / Left Lower Extremity DVT: [name] presented to the ED on 9/13 with shortness of breath and pleuritic back pain in the setting of a recently diagnosed DVT and known Factor V Leiden Deficiency. Two days prior, he had been seen in the ED and diagnosed with a left common iliac DVT. He had been off of his home Eliquis for at least four weeks. He was restarted on Eliquis at a loading dose. ED workup with negative troponins and a normal EKG. CTA Chest showed multiple subsegmental pulmonary emboli bilaterally. He required O2 via nasal cannula for one day and this was weaned as tolerated.   Tobacco use: [name] [name] is a chronic tobacco user with a desire to quit smoking. He did very well during this hospitalization with a nicotine replacement patch and voiced a desire to abstain from nicotine after discharge.

## 2020-01-01 LAB — CBC WITH DIFFERENTIAL/PLATELET
Abs Immature Granulocytes: 0.07 10*3/uL (ref 0.00–0.07)
Basophils Absolute: 0.1 10*3/uL (ref 0.0–0.1)
Basophils Relative: 0 %
Eosinophils Absolute: 0.3 10*3/uL (ref 0.0–0.5)
Eosinophils Relative: 2 %
HCT: 42.7 % (ref 39.0–52.0)
Hemoglobin: 14.7 g/dL (ref 13.0–17.0)
Immature Granulocytes: 1 %
Lymphocytes Relative: 9 %
Lymphs Abs: 1.3 10*3/uL (ref 0.7–4.0)
MCH: 31.5 pg (ref 26.0–34.0)
MCHC: 34.4 g/dL (ref 30.0–36.0)
MCV: 91.6 fL (ref 80.0–100.0)
Monocytes Absolute: 1.6 10*3/uL — ABNORMAL HIGH (ref 0.1–1.0)
Monocytes Relative: 11 %
Neutro Abs: 11.5 10*3/uL — ABNORMAL HIGH (ref 1.7–7.7)
Neutrophils Relative %: 77 %
Platelets: 275 10*3/uL (ref 150–400)
RBC: 4.66 MIL/uL (ref 4.22–5.81)
RDW: 12.5 % (ref 11.5–15.5)
WBC: 14.7 10*3/uL — ABNORMAL HIGH (ref 4.0–10.5)
nRBC: 0 % (ref 0.0–0.2)

## 2020-01-01 MED ORDER — NICOTINE 14 MG/24HR TD PT24: 14.0000 mg | MEDICATED_PATCH | Freq: Every day | TRANSDERMAL | 1 refills | Status: DC

## 2020-01-01 MED ORDER — APIXABAN 5 MG PO TABS
10.0000 mg | ORAL_TABLET | Freq: Two times a day (BID) | ORAL | 0 refills | Status: DC
Start: 2020-01-01 — End: 2020-01-29

## 2020-01-01 NOTE — Progress Notes (Signed)
   Subjective:  [name] reports feeling much better this morning and would like to go home. He is able to sit up and ambulate without becoming short of breath. He endorses some pain with deep inspiration but states "I know my limitations." His significant other has been able to pick up his Eliquis for him. He denies any chest pain or lower extremity pain/swelling.  We discussed his return to work. His employer has set up some light duty work for him for his first few weeks back.   Objective:  Vital signs in last 24 hours: Vitals:   01/01/20 0600 01/01/20 0729 01/01/20 0830 01/01/20 1214  BP:  109/69    Pulse:  90    Resp:  16    Temp:  99 F (37.2 C)  98.2 F (36.8 C)  TempSrc:    Oral  SpO2:  96% 94%   Weight: 96.4 kg     Height:       Weight change: -1.2 kg  Intake/Output Summary (Last 24 hours) at 01/01/2020 1413 Last data filed at 12/31/2019 2100 Gross per 24 hour  Intake 200 ml  Output --  Net 200 ml   Physical Exam Vitals and nursing note reviewed.  Constitutional:      General: He is not in acute distress. Cardiovascular:     Rate and Rhythm: Normal rate and regular rhythm.     Heart sounds: Normal heart sounds.  Pulmonary:     Effort: Pulmonary effort is normal.     Breath sounds: Normal breath sounds.  Musculoskeletal:     Comments: Legs with chronic venous stasis skin changes and mild nontender edema bilaterally.   Psychiatric:        Mood and Affect: Mood normal. Mood is not anxious.     Assessment/Plan:  [name] is a 39 year old male with a history of Factor V leiden, prior DVT/PE with IVC filter who is being treated for a L Common Iliac DVT and multiple subsegmental pulmonary emboli.   Active Problems:   Pulmonary embolism (HCC)  Pulmonary embolism: Patient with Factor V Leiden presented with SOB and pleuritic CP after several weeks off of home Eliquis.  CTA with multiple subsegmental PEs bilaterally. CXR nl and lungs clear on exam. COVID  negative. Eliquis restarted. He is hemodynamically stable with a mild leukocytosis. He is breathing comfortably and satting well on room air.  - Discharge home today. - Eliquis 10mg  BID for one week, then 5 mg BID indefinitely - Outpatient follow-up with our clinic.   Left lower extremity DVT: Patient with no erythema, warmth, or tenderness on exam. Day 5 of anticoagulation. - Scheduled to follow up with vascular surgery on 9/24 - Continue Eliquis   Tobacco use: Patient smokes half pack per day. Motivated to quit with good support at home. Did well with nicotine patch during hospitalization. - Discharge with nicotine patches.  - Outpatient follow-up to support cessation efforts.    LOS: 1 day   10/24, Medical Student 01/01/2020, 2:13 PM

## 2020-01-01 NOTE — Plan of Care (Signed)

## 2020-01-01 NOTE — Discharge Summary (Signed)
Name: [name] [name] MRN: [id number] DOB: 31-Oct-1980 39 y.o. PCP: Dolan Amen, MD  Date of Admission: 12/29/2019 11:51 PM Date of Discharge: 01/01/20 Attending Physician: No att. providers found  Discharge Diagnosis:  Active Problems:   PRIMARY HYPERCOAGULABLE STATE   Factor V Leiden, prothrombin gene mutation (HCC)   Pulmonary embolism (HCC)  1. Acute, Bilateral Pulmonary Emboli L > R 2. Multiple, Acute LLE DVT 3. Tobacco Use Disorder  Discharge Medications: Allergies as of 01/01/2020   No Known Allergies     Medication List    TAKE these medications   acetaminophen 325 MG tablet Commonly known as: TYLENOL Take 2 tablets (650 mg total) by mouth every 6 (six) hours as needed for mild pain (or temp > 100).   apixaban 5 MG Tabs tablet Commonly known as: ELIQUIS Take 2 tablets (10 mg total) by mouth 2 (two) times daily for 60 doses. Take 2 tablets (10mg  total) by mouth two times daily for four days. Starting Monday 9/20, take 1 tablet by mouth two times daily every day. What changed:   how much to take  how to take this  when to take this  additional instructions   nicotine 14 mg/24hr patch Commonly known as: NICODERM CQ - dosed in mg/24 hours Place 1 patch (14 mg total) onto the skin daily. Start taking on: January 02, 2020       Disposition and follow-up:   [name] [name] was discharged from [facility] in Stable condition.  At the hospital follow up visit please address:  1.   A. Acute Bilateral Pulmonary Emboli / LLE DVTs - Please be sure patient completed loading dose of Eliquis 10mg  BID x 7 days total and is taking Eliquis 5mg  BID thereafter for life. Please be sure patient follows up with [facility] from Vascular Surgery on 01/10/20.    B. Tobacco Use Disorder - Please discuss smoking cessation options with patient and taper nicotine patch dose if continued.   2.  Labs / imaging needed at time of follow-up: CBC  3.  Pending  labs/ test needing follow-up: None  Follow-up Appointments:  Follow-up Information    , MD. Schedule an appointment as soon as possible for a visit in 1 week(s).   Specialty: Internal Medicine Why: You should make an appointment with [name] [name] (your new PCP) at [facility] within the next week. We will inform them and you should receive a call to schedule; however, if you do not hear back within the next day or two, please call [phone] to schedule Contact information: [address] [place] [phone] [address] [phone]              Hospital Course by problem list: 1. Acute Bilateral Pulmonary Emboli in setting of Recently-Diagnosed LLE DVT's -  [name] [name] presented to the ED on 9/13 with shortness of breath and pleuritic back pain in the setting of a recently diagnosed DVT and known Factor V Leiden Deficiency. Two days prior, he had been seen in the ED and was diagnosed with acute superfician ven thrombosis of R small saphenous vein and acute DVT's of the L common femoral vein, SF junction, femoral vein, proximal profunda vein, popliteal ven, and posterior tibial veins. He also had chronic thrombus of the L peroneal veins and thrombi within the L common and external iliac veins . CTA with contrast this admission demonstrated L > R bilateral acute pulmonary emboli with trace L pleural effusion  and L-sided atelectasis without right heart strain. He had been off of his home Eliquis for at least four weeks. In the hospital, he was started on loading dose of Eliquis (10mg  BID) to be continued for 7 days before weaning to 5mg  BID. ED workup with negative troponins and a normal EKG. He required admission due to hypoxia requiring O2 via [place]; however, he was successfully taken off O2 supplementation on hospital day 2 and discharged.  2. Tobacco Use Disorder - [name] [name] has smoked at least 1 PPD cigarettes since early adulthood. He was started on nicotine patch in the  hospital and voiced a desire to stop smoking following discharge. He was discharged with prescription for nicotine patches 14mg  x 2 weeks.   Discharge Vitals:   BP 136/89 (BP Location: Left Arm)   Pulse (!) 103   Temp 98.3 F (36.8 C)   Resp 17   Ht 6' (1.829 m)   Wt 96.4 kg   SpO2 94%   BMI 28.82 kg/m   Pertinent Labs, Studies, and Procedures:   Labs: Troponin 7, <2 WBC 14.2, ANC 11.5 Hgb 16.0 HIV negative  CHEST - 2 VIEW COMPARISON:  October 12, 2016 FINDINGS: A trace amount of atelectasis is seen within the bilateral lung bases. There is no evidence of acute infiltrate, pleural effusion or pneumothorax. The heart size and mediastinal contours are within normal limits. The visualized skeletal structures are unremarkable  IMPRESSION: No active cardiopulmonary disease. Electronically Signed   By: Julien Girt M.D.   On: 12/30/2019 01:17  CT ANGIOGRAPHY CHEST WITH CONTRAST COMPARISON:  Chest x-ray 12/30/2019, CT chest 01/26/2011 FINDINGS: Cardiovascular: Satisfactory opacification of the pulmonary arteries to the segmental level. Small subsegmental right lower lobe emboli. Left upper lobe segmental and subsegmental emboli. Acute thrombus within descending left lower lobar artery with thrombus extending into multiple segmental and subsegmental left lower lobe pulmonary vessels. Cardiac size within normal limits. No right heart strain, RV LV ratio is 0.8. Nonaneurysmal aorta. Lungs/Pleura: No acute consolidation or pleural effusion. Hazy density in the left lung base probably atelectasis. Trace left pleural effusion  IMPRESSION: 1. Positive for left greater than right bilateral acute pulmonary emboli. Negative for right heart strain. 2. Trace left pleural effusion. Hazy left lung base density probably reflects atelectasis. Electronically Signed   By: 01/01/2020 M.D.   On: 12/30/2019 03:15  Recent Imaging: VAS 03/28/2011 Lower Extremity Venous RIGHT:  -  Findings consistent with acute superficial vein thrombosis involving the  right small saphenous vein.  LEFT:  - Findings consistent with acute deep vein thrombosis involving the left  common femoral vein, SF junction, left femoral vein, left proximal  profunda vein, left popliteal vein, and left posterior tibial veins.  - Findings consistent with chronic deep vein thrombosis involving the left  peroneal veins.  - Ultrasound characteristics of enlarged lymph nodes noted in the groin.   Electronically signed by Jasmine Pang MD on 12/28/2019 at 2:19:51 PM.    VAS [place] IVC/Iliac Left Summary:  IVC/Iliac: There is no evidence of thrombus involving the IVC. There is no  evidence of thrombus involving the right common iliac vein. There is  evidence of acute thrombus involving the left common iliac vein. There is  no evidence of thrombus involving  the right external iliac vein. There is evidence of acute thrombus  involving the left external iliac vein.   Electronically signed by Sherald Hess MD on 12/28/2019 at 2:20:29 PM.    Discharge Instructions: Discharge Instructions  Call MD for:  difficulty breathing, headache or visual disturbances   Complete by: As directed    Call MD for:  persistant dizziness or light-headedness   Complete by: As directed    Call MD for:  severe uncontrolled pain   Complete by: As directed    Call MD for:  temperature >100.4   Complete by: As directed    Diet - low sodium heart healthy   Complete by: As directed    Discharge instructions   Complete by: As directed    Mr. Beauchaine, you were admitted to the hospital because you were found to have a blood clot in your legs as well as in your lungs. During your hospitalization, you were started on Eliquis (a blood thinner) that you will need to take twice daily every day moving forward to heal these blood clots and prevent future clotting.   I have prescribed Eliquis to your [facility].    Tomorrow, Friday, Saturday, and Sunday - please take 2 tablets in the morning, and 2 tablets in the evening. Beginning Monday (01/06/20), please take 1 tablet in the morning and 1 tablet in the evening. It is important that you continue taking 1 tablet in the morning and 1 tablet in the evening every day thereafter. I have also prescribed you 2 weeks of nicotine patches; please apply 1 patch daily and you can discuss other smoking cessation options at your next doctor's appointment.  I have notified our [facility] clinic here that you will need an appointment within the next week to follow-up with your new Primary Care doctor. You should receive a call from them to schedule. If you do not hear back within the next couple of days, please call [facility] at [phone] to schedule an appointment within this week.  Please also be sure to follow up with your vascular surgery appointment on 01/10/20.  Thank you and we hope you stay well.   Increase activity slowly   Complete by: As directed       Signed: Glenford Bayley, MD 01/01/2020, 8:59 PM   Pager: [phone]

## 2020-01-01 NOTE — Progress Notes (Signed)
Pt. Discharged in stable condition. Left via car family. AVS documentation reviewed. Pt. Verbalized understanding of the need to pick up and take Eliquis 10 mg tonight. Pt. Stated he will pick it up when he leaves hospital. All belongings sent with pt.

## 2020-01-01 NOTE — Progress Notes (Signed)
SATURATION QUALIFICATIONS: (This note is used to comply with regulatory documentation for home oxygen)  Patient Saturations on Room Air at Rest = 96%  Patient Saturations on Room Air while Ambulating = 94%    Please briefly explain why patient needs home oxygen: pt. Is able to maintain oxygen saturations while ambulating.

## 2020-01-08 ENCOUNTER — Encounter: Payer: 59 | Admitting: Internal Medicine

## 2020-01-09 ENCOUNTER — Encounter: Payer: 59 | Admitting: Internal Medicine

## 2020-01-10 ENCOUNTER — Ambulatory Visit: Payer: 59 | Admitting: Vascular Surgery

## 2020-01-29 ENCOUNTER — Other Ambulatory Visit: Payer: Self-pay

## 2020-01-29 MED ORDER — APIXABAN 5 MG PO TABS
ORAL_TABLET | ORAL | 1 refills | Status: DC
Start: 2020-01-29 — End: 2020-01-30

## 2020-01-29 MED ORDER — APIXABAN 5 MG PO TABS: 10.0000 mg | ORAL_TABLET | Freq: Two times a day (BID) | ORAL | 0 refills | Status: DC

## 2020-01-29 NOTE — Addendum Note (Signed)
Addended by: Fredderick Severance on: 01/29/2020 03:32 PM   Modules accepted: Orders

## 2020-01-29 NOTE — Telephone Encounter (Signed)
apixaban (ELIQUIS) 5 MG TABS tablet, REFILL REQUEST @  [id number] - [place] ([place]), [place] - [address] Phone:  [phone]  Fax:  [phone]

## 2020-01-30 ENCOUNTER — Other Ambulatory Visit: Payer: Self-pay

## 2020-01-30 ENCOUNTER — Encounter: Payer: Self-pay | Admitting: Student

## 2020-01-30 ENCOUNTER — Other Ambulatory Visit: Payer: Self-pay | Admitting: Internal Medicine

## 2020-01-30 ENCOUNTER — Ambulatory Visit (INDEPENDENT_AMBULATORY_CARE_PROVIDER_SITE_OTHER): Payer: 59 | Admitting: Student

## 2020-01-30 VITALS — BP 120/64 | HR 67 | Temp 97.8°F | Ht 72.0 in | Wt 231.9 lb

## 2020-01-30 DIAGNOSIS — Z7901 Long term (current) use of anticoagulants: Secondary | ICD-10-CM | POA: Diagnosis not present

## 2020-01-30 DIAGNOSIS — I82409 Acute embolism and thrombosis of unspecified deep veins of unspecified lower extremity: Secondary | ICD-10-CM | POA: Diagnosis not present

## 2020-01-30 DIAGNOSIS — D6859 Other primary thrombophilia: Secondary | ICD-10-CM | POA: Diagnosis not present

## 2020-01-30 DIAGNOSIS — I2699 Other pulmonary embolism without acute cor pulmonale: Secondary | ICD-10-CM

## 2020-01-30 MED ORDER — APIXABAN 5 MG PO TABS: ORAL_TABLET | ORAL | 1 refills | Status: DC

## 2020-01-30 NOTE — Assessment & Plan Note (Addendum)
Patient was found to have bilateral PEs during hospital admission on 9/12.  Patient was treated with anticoagulation and discharged on 9/15 with a loading dose of Eliquis.  Patient states he finishes loading dose continue on the maintenance dose until yesterday when he ran out.  Patient did not take any Eliquis yesterday reports experiencing some tightness in his chest at work which has now resolved.  Plan: --Sample box of Eliquis 5 mg containing 14 tablets was given to patient in clinic. Patient was monitored as he took the first dose this morning. --Refilled Eliquis 5 mg twice daily. Advised patient to call in refill a week before his medication runs out. --Advised to make office appointment with vascular surgeon. --Follow-up CBC

## 2020-01-30 NOTE — Progress Notes (Signed)
   CC: Hospital follow-up  HPI:  [name] is a 39 y.o. male with PMH of factor V Leiden, DVTs, PEs, depression and GERD who presents for hospital follow-up after being treated for bilateral PEs.  Patient endorsed some mild chest tightness yesterday at work which is now resolved.  He denies any shortness of breath, palpitations, headaches, dizziness, abdominal pain or leg pain.  Please see problem based charting for evaluation, assessment and plan.  Past Medical History:  Diagnosis Date  . Anxiety and depression   . DVT (deep venous thrombosis) (HCC)    Pt unsure of date   . Factor V Leiden mutation (HCC)    With resultant hypercoaguable state, Hx of DVT, PE, SP IVC filter placement, on chronic coumadin and requires high doses to maintine IRN 2-3.  Marland Kitchen GERD (gastroesophageal reflux disease)    history of  . Myocardial infarction (HCC)   . PE (pulmonary thromboembolism) (HCC)    unsure of date  . Pneumonia   . Pneumothorax 8/11   HX of, requiring chest tube/hospitalization.   . Venous stasis    bilateral LE.   Review of Systems:  All ROS negative otherwise as stated in the HPI  Physical Exam:  General: Pleasant middle-age man. No acute distress. Well nourished, well developed. Head: Normocephalic, atraumatic Cardiac: RRR. No murmurs, rubs or gallops. S1, S2. No lower extremities edema Respiratory: Lungs CTAB. No wheezing or crackles. No increased WOB Abdominal: Soft, symmetric and non tender. No organomegaly. Normal bowel sounds Skin: Warm, dry and intact without rashes or lesions Extremities: Atraumatic. Full ROM. Pulse palpable.  Bilateral lower extremities in compression stocking. Neuro: A&O x 3. Moves all extremities. Psych: Appropriate mood and affect. Normal judgement  Vitals:   01/30/20 0948  BP: 120/64  Pulse: 67  Temp: 97.8 F (36.6 C)  TempSrc: Oral  SpO2: 98%  Weight: 231 lb 14.4 oz (105.2 kg)  Height: 6' (1.829 m)    Assessment & Plan:   See  Encounters Tab for problem based charting.  Patient seen with Dr. Yolande Jolly, MD, MPH

## 2020-01-30 NOTE — Patient Instructions (Signed)
Thank you, [name] for allowing [place] to provide your care today. Today we discussed your blood clots and shortness of breath. We are very happy that you quit smoking. Please continue taking your Eliquis as prescribed and follow-up with vascular surgeon.  I have ordered the following labs for you:   Lab Orders     CBC no Diff   I will call if any are abnormal. All of your labs can be accessed through "My Chart".  Make sure to call the vascular surgeon to schedule the appointment.  I have ordered the following medication/changed the following medications:  1. Gave you sample of Eliquis 5 mg twice a day for 1 week.  Make sure to refill your medication and continue taking it as described.  Please follow-up in 3 months  Should you have any questions or concerns please call the internal medicine clinic at [phone].    Sharrell Ku, MD, MPH [facility]   My Chart Access: [email or web address]   If you have not already done so, please get your COVID 19 vaccine  To schedule an appointment for a COVID vaccine choice any of the following: Go to [email or web address]   Go to [email or web address]                  Call [phone]                                     Call [phone] and select Option 2

## 2020-01-30 NOTE — Assessment & Plan Note (Signed)
Patient has a history of diabetes due to factor V Leiden mutation. Currently have pressure stocking on both lower extremities. States vascular surgeon informed him that they will likely have to do surgery on his legs to remove the clots. Patient states he has been helping his family move and wants to get things together at home before scheduling to meet with vascular surgeon in the outpatient.  Patient states he is quitting smoking and has not smoked since she was discharged from the hospital.  Plan: --Follow-up with vascular surgeon --Continue smoking cessation.  Doing it cold [place] and does not want nicotine patch.

## 2020-01-31 LAB — CBC
Hematocrit: 43.6 % (ref 37.5–51.0)
Hemoglobin: 15.1 g/dL (ref 13.0–17.7)
MCH: 32 pg (ref 26.6–33.0)
MCHC: 34.6 g/dL (ref 31.5–35.7)
MCV: 92 fL (ref 79–97)
Platelets: 234 10*3/uL (ref 150–450)
RBC: 4.72 x10E6/uL (ref 4.14–5.80)
RDW: 13.1 % (ref 11.6–15.4)
WBC: 7.3 10*3/uL (ref 3.4–10.8)

## 2020-01-31 NOTE — Progress Notes (Signed)
Internal Medicine Clinic Attending  I saw and evaluated the patient.  I personally confirmed the key portions of the history and exam documented by Dr. Amponsah and I reviewed pertinent patient test results.  The assessment, diagnosis, and plan were formulated together and I agree with the documentation in the resident's note.  

## 2020-03-16 ENCOUNTER — Telehealth: Payer: Self-pay | Admitting: *Deleted

## 2020-03-16 NOTE — Telephone Encounter (Signed)
Patient and girlfriend called in stating patient has a known clot in LLE. Taking Eliquis daily without missed doses. Has consultation with Vascular Surgery on 04/03/2020 but feels he should be sooner. He has been having swelling in LLE with any activity. States this is the same, has not worsened. Denies SHOB. Advised to contact Vascular to see if they can have sooner appt. Will call back to schedule with PCP if they cannot be seen sooner. Kinnie Feil, BSN, RN-BC

## 2020-03-16 NOTE — Telephone Encounter (Signed)
Patient's GF called back stating they could not get a sooner appt with vascular as they are booked 6 weeks out. Appt given for tomorrow at 0845 with Red Team. She is advised to take patient to ED before then if he develops [facility] or any other concerning sx. She is in agreement. Kinnie Feil, BSN, RN-BC

## 2020-03-16 NOTE — Telephone Encounter (Signed)
I agree

## 2020-03-17 ENCOUNTER — Encounter: Payer: Self-pay | Admitting: Student

## 2020-03-17 ENCOUNTER — Ambulatory Visit (INDEPENDENT_AMBULATORY_CARE_PROVIDER_SITE_OTHER): Payer: 59 | Admitting: Student

## 2020-03-17 ENCOUNTER — Other Ambulatory Visit: Payer: Self-pay

## 2020-03-17 DIAGNOSIS — Z87891 Personal history of nicotine dependence: Secondary | ICD-10-CM | POA: Diagnosis not present

## 2020-03-17 DIAGNOSIS — I82402 Acute embolism and thrombosis of unspecified deep veins of left lower extremity: Secondary | ICD-10-CM | POA: Diagnosis not present

## 2020-03-17 NOTE — Assessment & Plan Note (Signed)
Commended patient on having stopped smoking cigarettes. Discussed that smoking coupled with his history of Factor V Leiden mutation significantly increases his risk of blood clot formation, so it is great that he was able to stop smoking. Will continue to encourage avoiding tobacco use.   Plan: -continue encouraging avoidance of cigarette smoking

## 2020-03-17 NOTE — Assessment & Plan Note (Addendum)
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 During last hospital admission in September 2021, patient found to have bilateral PE (L>R) and multiple DVTs in left lower extremity. He has an appointment with vascular surgery on 04/03/2020. He came in today because of left leg swelling 2/2 multiple DVTs in left leg. He has some leg swelling at baseline bilaterally due to venous stasis dermatitis. He denies fevers, chills, chest pain, SHOB, tachypnea. On exam, there is mild TTP of left calf and there is venous stasis dermatitis present bilaterally near the ankles. The left leg is more swollen than the right leg. I advised patient to continue current management with compression stockings, leg elevation, and his eliquis 5mg  BID. At this time, I suspect the left leg swelling is due to the presence of multiple DVTs. However, there is the possibility that the patient has May-Thurner syndrome which could be causing this unilateral left leg swelling. However, this may be better assessed by vascular surgery at patient's upcoming visit.  Plan: -continue compression stockings and leg elevation -continue eliquis 5mg  BID -f/u with vascular surgery on 04/03/2020 -f/u with [facility] in 1 month

## 2020-03-17 NOTE — Patient Instructions (Signed)
[name] [name],  It was a pleasure seeing you in the clinic today.   We discussed the following today:  1. For your leg swelling, continue to wear your compression stockings and keep your legs elevated.  2. Please make sure to continue taking your Eliquis twice daily.  3. Please go to your appointment with Vascular surgery on 04/03/2020.  Please call our clinic at [phone] if you have any questions or concerns. The best time to call is Monday-Friday from 9am-4pm, but there is someone available 24/7 at the same number. If you need medication refills, please notify your pharmacy one week in advance and they will send [place] a request.   Thank you for letting us take part in your care. We look forward to seeing you next time!

## 2020-03-17 NOTE — Progress Notes (Signed)
CC: f/u for multiple DVTs in LLE  HPI:  [name] is a 39 y.o. male with history of factor V leiden mutation (on lifelong eliquis therapy), hx of PE's and DVTs despite IVC filter placement (in 2009) presenting to the [facility] for follow up of known DVTs in LLE. Please see individualized A&P for full HPI.  Past Medical History:  Diagnosis Date  . Anxiety and depression   . DVT (deep venous thrombosis) (HCC)    Pt unsure of date   . Factor V Leiden mutation (HCC)    With resultant hypercoaguable state, Hx of DVT, PE, SP IVC filter placement, on chronic coumadin and requires high doses to maintine IRN 2-3.  Marland Kitchen GERD (gastroesophageal reflux disease)    history of  . Myocardial infarction (HCC)   . PE (pulmonary thromboembolism) (HCC)    unsure of date  . Pneumonia   . Pneumothorax 8/11   HX of, requiring chest tube/hospitalization.   . Venous stasis    bilateral LE.   Review of Systems:    + for left leg swelling  Otherwise negative ROS.  Physical Exam:  Vitals:   03/17/20 0905  BP: 130/73  Pulse: 70  Temp: 98.1 F (36.7 C)  TempSrc: Oral  SpO2: 97%  Weight: 243 lb (110.2 kg)  Height: 6' (1.829 m)   Physical Exam Constitutional:      Appearance: He is obese. He is not ill-appearing.  HENT:     Head: Normocephalic and atraumatic.     Mouth/Throat:     Mouth: Mucous membranes are moist.     Pharynx: Oropharynx is clear. No oropharyngeal exudate.  Eyes:     Extraocular Movements: Extraocular movements intact.     Conjunctiva/sclera: Conjunctivae normal.     Pupils: Pupils are equal, round, and reactive to light.  Cardiovascular:     Rate and Rhythm: Normal rate and regular rhythm.     Pulses: Normal pulses.     Heart sounds: Normal heart sounds. No murmur heard.  No friction rub. No gallop.   Pulmonary:     Effort: Pulmonary effort is normal. No respiratory distress.     Breath sounds: Normal breath sounds. No wheezing, rhonchi or rales.  Chest:      Chest wall: No tenderness.  Abdominal:     General: Bowel sounds are normal. There is no distension.     Palpations: Abdomen is soft.     Tenderness: There is no abdominal tenderness.  Musculoskeletal:        General: Normal range of motion.     Cervical back: Normal range of motion.     Comments: 1-2+ pitting edema on left lower leg up to mid-shin. Mild edema on right lower leg up to mid-shin. Mild TTP of left calf.   Skin:    General: Skin is warm and dry.     Comments: Bluish/purple skin discoloration on bilateral legs, consistent with venous stasis dermatitis.  Neurological:     General: No focal deficit present.     Mental Status: He is alert and oriented to person, place, and time.     Motor: No weakness.  Psychiatric:        Mood and Affect: Mood normal.        Behavior: Behavior normal.        Thought Content: Thought content normal.      Assessment & Plan:   See Encounters Tab for problem based charting.  Patient seen with Dr. Oswaldo Done

## 2020-03-18 NOTE — Progress Notes (Signed)
Internal Medicine Clinic Attending  I saw and evaluated the patient.  I personally confirmed the key portions of the history and exam documented by Dr. Austin Miles and I reviewed pertinent patient test results.  The assessment, diagnosis, and plan were formulated together and I agree with the documentation in the resident's note. Low suspicion for new DVT based on his exam with chronic swelling and a slow onset of non-specific symptoms. Ok to continue treatment with eliquis which has been much easier for him to manage than coumadin.

## 2020-04-03 ENCOUNTER — Other Ambulatory Visit: Payer: Self-pay

## 2020-04-03 ENCOUNTER — Ambulatory Visit: Payer: 59 | Admitting: Vascular Surgery

## 2020-04-03 ENCOUNTER — Encounter: Payer: Self-pay | Admitting: Vascular Surgery

## 2020-04-03 VITALS — BP 111/72 | HR 68 | Temp 97.7°F | Resp 20 | Ht 72.0 in | Wt 245.0 lb

## 2020-04-03 DIAGNOSIS — I87002 Postthrombotic syndrome without complications of left lower extremity: Secondary | ICD-10-CM | POA: Diagnosis not present

## 2020-04-03 MED ORDER — APIXABAN 5 MG PO TABS: 5.0000 mg | ORAL_TABLET | Freq: Two times a day (BID) | ORAL | 6 refills | Status: DC

## 2020-04-03 NOTE — Progress Notes (Signed)
Patient ID: [name] [name], male   DOB: [birth year], 39 y.o.   MRN: [id number]  Reason for Consult: Follow-up   Referred by Dolan Amen, MD  Subjective:     HPI:  [name] [name] is a 39 y.o. male has a history of an IVC filter placed several years ago at the time that he had PE.  In 2018 he underwent lytic catheter placement into his right common iliac vein with follow-up angioplasty.  More recently he was found to have extensive left lower extremity DVT.  At that time he deferred any intervention given that he needed to work.  He is currently on light duty at work given swelling in his leg which is quite debilitating.  He is taking Eliquis does have only a $10 co-pay for this.  He has factor V Leiden.  States that his left lower extremity swelling prevents many of his pants from fitting and also is very uncomfortable causes pain with minimal activity.  He does wear compression stockings thigh-high bilaterally.  Past Medical History:  Diagnosis Date  . Anxiety and depression   . DVT (deep venous thrombosis) (HCC)    Pt unsure of date   . Factor V Leiden mutation (HCC)    With resultant hypercoaguable state, Hx of DVT, PE, SP IVC filter placement, on chronic coumadin and requires high doses to maintine IRN 2-3.  Marland Kitchen GERD (gastroesophageal reflux disease)    history of  . Myocardial infarction (HCC)   . PE (pulmonary thromboembolism) (HCC)    unsure of date  . Pneumonia   . Pneumothorax 8/11   HX of, requiring chest tube/hospitalization.   . Venous stasis    bilateral LE.   History reviewed. No pertinent family history. Past Surgical History:  Procedure Laterality Date  . ANGIOPLASTY ILLIAC ARTERY Right 07/11/2016   Procedure: balloon ANGIOPLASTY of Inferior vena cava, right external iliac vein, right common femoral vein;  Surgeon: Maeola Harman, MD;  Location: [facility] OR;  Service: Vascular;  Laterality: Right;  . INTRAVASCULAR ULTRASOUND/IVUS Right 07/11/2016   Procedure:  INTRAVASCULAR ULTRASOUND/IVUS of right popliteal vein, right common femoral vein, right femoral vein, right exterternal iliac vein, and right internal iliac vein;  Surgeon: Maeola Harman, MD;  Location: [facility] OR;  Service: Vascular;  Laterality: Right;  . LOWER EXTREMITY ANGIOGRAPHY N/A 07/12/2016   Procedure: LYSIS RECHECK;  Surgeon: Nada Libman, MD;  Location: [facility] INVASIVE CV LAB;  Service: Cardiovascular;  Laterality: N/A;  . placement of IVC filter  8/09  . Placement of large-bore right chest tube  8/11  . right minithoracotomy with evacuation of hematoma  8/09  . ULTRASOUND GUIDANCE FOR VASCULAR ACCESS Right 07/11/2016   Procedure: ULTRASOUND GUIDANCE FOR VASCULAR ACCESS;  Surgeon: Maeola Harman, MD;  Location: [facility] OR;  Service: Vascular;  Laterality: Right;    Short Social History:  Social History   Tobacco Use  . Smoking status: Former Smoker    Packs/day: 1.00    Years: 20.00    Pack years: 20.00    Types: Cigarettes    Start date: 07/07/2016  . Smokeless tobacco: Former Neurosurgeon  . Tobacco comment: stopped 1 month ago  Substance Use Topics  . Alcohol use: No    No Known Allergies  Current Outpatient Medications  Medication Sig Dispense Refill  . acetaminophen (TYLENOL) 325 MG tablet Take 2 tablets (650 mg total) by mouth every 6 (six) hours as needed for mild pain (or temp > 100).    Marland Kitchen  apixaban (ELIQUIS) 5 MG TABS tablet Take 1 tablet by mouth two times daily 60 tablet 1   No current facility-administered medications for this visit.    Review of Systems  Constitutional:  Constitutional negative. HENT: HENT negative.  Eyes: Eyes negative.  Respiratory: Respiratory negative.  Cardiovascular: Positive for leg swelling.  GI: Gastrointestinal negative.  Musculoskeletal: Positive for leg pain.  Skin: Skin negative.  Neurological: Neurological negative. Hematologic: Hematologic/lymphatic negative.  Psychiatric: Psychiatric negative.        Objective:   Objective   Vitals:   04/03/20 0931  BP: 111/72  Pulse: 68  Resp: 20  Temp: 97.7 F (36.5 C)  SpO2: 97%  Weight: 245 lb (111.1 kg)  Height: 6' (1.829 m)   Body mass index is 33.23 kg/m.  Physical Exam HENT:     Head: Normocephalic.     Nose:     Comments: Wearing a mask Eyes:     Pupils: Pupils are equal, round, and reactive to light.  Cardiovascular:     Rate and Rhythm: Normal rate.  Pulmonary:     Effort: Pulmonary effort is normal.  Abdominal:     General: Abdomen is flat.     Palpations: Abdomen is soft.  Musculoskeletal:        General: Normal range of motion.     Right lower leg: Edema present.     Left lower leg: Edema present.  Skin:    General: Skin is warm.     Capillary Refill: Capillary refill takes less than 2 seconds.  Neurological:     General: No focal deficit present.     Mental Status: He is alert.  Psychiatric:        Mood and Affect: Mood normal.        Behavior: Behavior normal.        Thought Content: Thought content normal.        Judgment: Judgment normal.     Data: DVT study Summary:  RIGHT:  - Findings consistent with acute superficial vein thrombosis involving the  right small saphenous vein.    LEFT:  - Findings consistent with acute deep vein thrombosis involving the left  common femoral vein, SF junction, left femoral vein, left proximal  profunda vein, left popliteal vein, and left posterior tibial veins.  - Findings consistent with chronic deep vein thrombosis involving the left  peroneal veins.  - Ultrasound characteristics of enlarged lymph nodes noted in the groin.        Assessment/Plan:     39-year-old male with history of right lower extremity DVT status post treatment and significant left lower extremity DVT for which he refused treatment.  He now has significant swelling of the left lower extremity which is life limiting and consistent with post thrombotic syndrome.  We have discussed his options being  continued anticoagulation and compression stockings versus possible intervention.  He has elected for the latter.  We will plan for prone positioning left popliteal approach probably will need right popliteal approach as well.  He will continue Eliquis throughout the perioperative time.  I have resent prescription for Eliquis to his pharmacy.  We discussed the risk and benefits as well as alternatives he demonstrates good understanding.     Tricia [place] Christopher Trenesha Alcaide MD [facility]  

## 2020-04-03 NOTE — H&P (View-Only) (Signed)
Patient ID: [name] [name], male   DOB: [birth year], 39 y.o.   MRN: [id number]  Reason for Consult: Follow-up   Referred by Dolan Amen, MD  Subjective:     HPI:  [name] [name] is a 39 y.o. male has a history of an IVC filter placed several years ago at the time that he had PE.  In 2018 he underwent lytic catheter placement into his right common iliac vein with follow-up angioplasty.  More recently he was found to have extensive left lower extremity DVT.  At that time he deferred any intervention given that he needed to work.  He is currently on light duty at work given swelling in his leg which is quite debilitating.  He is taking Eliquis does have only a $10 co-pay for this.  He has factor V Leiden.  States that his left lower extremity swelling prevents many of his pants from fitting and also is very uncomfortable causes pain with minimal activity.  He does wear compression stockings thigh-high bilaterally.  Past Medical History:  Diagnosis Date  . Anxiety and depression   . DVT (deep venous thrombosis) (HCC)    Pt unsure of date   . Factor V Leiden mutation (HCC)    With resultant hypercoaguable state, Hx of DVT, PE, SP IVC filter placement, on chronic coumadin and requires high doses to maintine IRN 2-3.  Marland Kitchen GERD (gastroesophageal reflux disease)    history of  . Myocardial infarction (HCC)   . PE (pulmonary thromboembolism) (HCC)    unsure of date  . Pneumonia   . Pneumothorax 8/11   HX of, requiring chest tube/hospitalization.   . Venous stasis    bilateral LE.   History reviewed. No pertinent family history. Past Surgical History:  Procedure Laterality Date  . ANGIOPLASTY ILLIAC ARTERY Right 07/11/2016   Procedure: balloon ANGIOPLASTY of Inferior vena cava, right external iliac vein, right common femoral vein;  Surgeon: Maeola Harman, MD;  Location: [facility] OR;  Service: Vascular;  Laterality: Right;  . INTRAVASCULAR ULTRASOUND/IVUS Right 07/11/2016   Procedure:  INTRAVASCULAR ULTRASOUND/IVUS of right popliteal vein, right common femoral vein, right femoral vein, right exterternal iliac vein, and right internal iliac vein;  Surgeon: Maeola Harman, MD;  Location: [facility] OR;  Service: Vascular;  Laterality: Right;  . LOWER EXTREMITY ANGIOGRAPHY N/A 07/12/2016   Procedure: LYSIS RECHECK;  Surgeon: Nada Libman, MD;  Location: [facility] INVAS[facility] CV LAB;  Service: Cardiovascular;  Laterality: N/A;  . placement of IVC filter  8/09  . Placement of large-bore right chest tube  8/11  . right minithoracotomy with evacuation of hematoma  8/09  . ULTRASOUND GUIDANCE FOR VASCULAR ACCESS Right 07/11/2016   Procedure: ULTRASOUND GUIDANCE FOR VASCULAR ACCESS;  Surgeon: Maeola Harman, MD;  Location: [facility] OR;  Service: Vascular;  Laterality: Right;    Short Social History:  Social History   Tobacco Use  . Smoking status: Former Smoker    Packs/day: 1.00    Years: 20.00    Pack years: 20.00    Types: Cigarettes    Start date: 07/07/2016  . Smokeless tobacco: Former Neurosurgeon  . Tobacco comment: stopped 1 month ago  Substance Use Topics  . Alcohol use: No    No Known Allergies  Current Outpatient Medications  Medication Sig Dispense Refill  . acetaminophen (TYLENOL) 325 MG tablet Take 2 tablets (650 mg total) by mouth every 6 (six) hours as needed for mild pain (or temp > 100).    Marland Kitchen  apixaban (ELIQUIS) 5 MG TABS tablet Take 1 tablet by mouth two times daily 60 tablet 1   No current facility-administered medications for this visit.    Review of Systems  Constitutional:  Constitutional negative. HENT: HENT negative.  Eyes: Eyes negative.  Respiratory: Respiratory negative.  Cardiovascular: Positive for leg swelling.  GI: Gastrointestinal negative.  Musculoskeletal: Positive for leg pain.  Skin: Skin negative.  Neurological: Neurological negative. Hematologic: Hematologic/lymphatic negative.  Psychiatric: Psychiatric negative.        Objective:   Objective   Vitals:   04/03/20 0931  BP: 111/72  Pulse: 68  Resp: 20  Temp: 97.7 F (36.5 C)  SpO2: 97%  Weight: 245 lb (111.1 kg)  Height: 6' (1.829 m)   Body mass index is 33.23 kg/m.  Physical Exam HENT:     Head: Normocephalic.     Nose:     Comments: Wearing a mask Eyes:     Pupils: Pupils are equal, round, and reactive to light.  Cardiovascular:     Rate and Rhythm: Normal rate.  Pulmonary:     Effort: Pulmonary effort is normal.  Abdominal:     General: Abdomen is flat.     Palpations: Abdomen is soft.  Musculoskeletal:        General: Normal range of motion.     Right lower leg: Edema present.     Left lower leg: Edema present.  Skin:    General: Skin is warm.     Capillary Refill: Capillary refill takes less than 2 seconds.  Neurological:     General: No focal deficit present.     Mental Status: He is alert.  Psychiatric:        Mood and Affect: Mood normal.        Behavior: Behavior normal.        Thought Content: Thought content normal.        Judgment: Judgment normal.     Data: DVT study Summary:  RIGHT:  - Findings consistent with acute superficial vein thrombosis involving the  right small saphenous vein.    LEFT:  - Findings consistent with acute deep vein thrombosis involving the left  common femoral vein, SF junction, left femoral vein, left proximal  profunda vein, left popliteal vein, and left posterior tibial veins.  - Findings consistent with chronic deep vein thrombosis involving the left  peroneal veins.  - Ultrasound characteristics of enlarged lymph nodes noted in the groin.        Assessment/Plan:     39 year old male with history of right lower extremity DVT status post treatment and significant left lower extremity DVT for which he refused treatment.  He now has significant swelling of the left lower extremity which is life limiting and consistent with post thrombotic syndrome.  We have discussed his options being  continued anticoagulation and compression stockings versus possible intervention.  He has elected for the latter.  We will plan for prone positioning left popliteal approach probably will need right popliteal approach as well.  He will continue Eliquis throughout the perioperative time.  I have resent prescription for Eliquis to his pharmacy.  We discussed the risk and benefits as well as alternatives he demonstrates good understanding.     Maeola Harman MD [facility]

## 2020-04-09 ENCOUNTER — Inpatient Hospital Stay (HOSPITAL_COMMUNITY): Admission: RE | Admit: 2020-04-09 | Payer: 59 | Source: Ambulatory Visit

## 2020-04-13 ENCOUNTER — Encounter (HOSPITAL_COMMUNITY)
Admission: RE | Disposition: A | Discharge status: Home / Self Care | Payer: Self-pay | Source: Home / Self Care | Attending: Vascular Surgery

## 2020-04-13 ENCOUNTER — Ambulatory Visit (HOSPITAL_COMMUNITY)
Admission: RE | Admit: 2020-04-13 | Discharge: 2020-04-13 | Disposition: A | Discharge status: Home / Self Care | Payer: 59 | Attending: Vascular Surgery | Admitting: Vascular Surgery

## 2020-04-13 ENCOUNTER — Other Ambulatory Visit: Payer: Self-pay

## 2020-04-13 DIAGNOSIS — Z7901 Long term (current) use of anticoagulants: Secondary | ICD-10-CM | POA: Diagnosis not present

## 2020-04-13 DIAGNOSIS — Z87891 Personal history of nicotine dependence: Secondary | ICD-10-CM | POA: Diagnosis not present

## 2020-04-13 DIAGNOSIS — I82512 Chronic embolism and thrombosis of left femoral vein: Secondary | ICD-10-CM | POA: Insufficient documentation

## 2020-04-13 DIAGNOSIS — Z20822 Contact with and (suspected) exposure to covid-19: Secondary | ICD-10-CM | POA: Insufficient documentation

## 2020-04-13 DIAGNOSIS — I82502 Chronic embolism and thrombosis of unspecified deep veins of left lower extremity: Secondary | ICD-10-CM | POA: Diagnosis not present

## 2020-04-13 HISTORY — PX: LOWER EXTREMITY VENOGRAPHY: CATH118253

## 2020-04-13 LAB — POCT I-STAT, CHEM 8
BUN: 19 mg/dL (ref 6–20)
BUN: 15 mg/dL (ref 6–20)
Calcium, Ion: 1.23 mmol/L (ref 1.15–1.40)
Calcium, Ion: 1.21 mmol/L (ref 1.15–1.40)
Chloride: 102 mmol/L (ref 98–111)
Chloride: 102 mmol/L (ref 98–111)
Creatinine, Ser: 0.8 mg/dL (ref 0.61–1.24)
Creatinine, Ser: 0.8 mg/dL (ref 0.61–1.24)
Glucose, Bld: 102 mg/dL — ABNORMAL HIGH (ref 70–99)
Glucose, Bld: 103 mg/dL — ABNORMAL HIGH (ref 70–99)
HCT: 44 % (ref 39.0–52.0)
HCT: 43 % (ref 39.0–52.0)
Hemoglobin: 15 g/dL (ref 13.0–17.0)
Hemoglobin: 14.6 g/dL (ref 13.0–17.0)
Potassium: 6.1 mmol/L — ABNORMAL HIGH (ref 3.5–5.1)
Potassium: 4 mmol/L (ref 3.5–5.1)
Sodium: 137 mmol/L (ref 135–145)
Sodium: 138 mmol/L (ref 135–145)
TCO2: 26 mmol/L (ref 22–32)
TCO2: 25 mmol/L (ref 22–32)

## 2020-04-13 LAB — SARS CORONAVIRUS 2 BY RT PCR (HOSPITAL ORDER, PERFORMED IN ~~LOC~~ HOSPITAL LAB): SARS Coronavirus 2: NEGATIVE

## 2020-04-13 SURGERY — LOWER EXTREMITY VENOGRAPHY
Anesthesia: LOCAL

## 2020-04-13 MED ORDER — HEPARIN (PORCINE) IN NACL 1000-0.9 UT/500ML-% IV SOLN
INTRAVENOUS | Status: DC | PRN
  Administered 2020-04-13: 500 mL

## 2020-04-13 MED ORDER — SODIUM CHLORIDE 0.9 % IV SOLN: INTRAVENOUS | Status: DC

## 2020-04-13 MED ORDER — IODIXANOL 320 MG/ML IV SOLN
INTRAVENOUS | Status: DC | PRN
  Administered 2020-04-13: 13:00:00 70 mL

## 2020-04-13 MED ORDER — FENTANYL CITRATE (PF) 100 MCG/2ML IJ SOLN
INTRAMUSCULAR | Status: DC | PRN
  Administered 2020-04-13: 50 ug via INTRAVENOUS

## 2020-04-13 MED ORDER — ONDANSETRON HCL 4 MG/2ML IJ SOLN: 4.0000 mg | Freq: Four times a day (QID) | INTRAMUSCULAR | Status: DC | PRN

## 2020-04-13 MED ORDER — HYDROMORPHONE HCL 1 MG/ML IJ SOLN: 0.5000 mg | INTRAMUSCULAR | Status: DC | PRN

## 2020-04-13 MED ORDER — ACETAMINOPHEN 325 MG PO TABS: 650.0000 mg | ORAL_TABLET | ORAL | Status: DC | PRN

## 2020-04-13 MED ORDER — FENTANYL CITRATE (PF) 100 MCG/2ML IJ SOLN
INTRAMUSCULAR | Status: AC
  Filled 2020-04-13: qty 2

## 2020-04-13 MED ORDER — SODIUM CHLORIDE 0.9 % IV SOLN: 250.0000 mL | INTRAVENOUS | Status: DC | PRN

## 2020-04-13 MED ORDER — LIDOCAINE HCL (PF) 1 % IJ SOLN
INTRAMUSCULAR | Status: AC
  Filled 2020-04-13: qty 30

## 2020-04-13 MED ORDER — SODIUM CHLORIDE 0.9% FLUSH: 3.0000 mL | INTRAVENOUS | Status: DC | PRN

## 2020-04-13 MED ORDER — MIDAZOLAM HCL 2 MG/2ML IJ SOLN
INTRAMUSCULAR | Status: DC | PRN
  Administered 2020-04-13: 2 mg via INTRAVENOUS

## 2020-04-13 MED ORDER — SODIUM CHLORIDE 0.9% FLUSH: 3.0000 mL | Freq: Two times a day (BID) | INTRAVENOUS | Status: DC

## 2020-04-13 MED ORDER — MIDAZOLAM HCL 2 MG/2ML IJ SOLN
INTRAMUSCULAR | Status: AC
  Filled 2020-04-13: qty 2

## 2020-04-13 MED ORDER — OXYCODONE HCL 5 MG PO TABS: 5.0000 mg | ORAL_TABLET | ORAL | Status: DC | PRN

## 2020-04-13 MED ORDER — LIDOCAINE HCL (PF) 1 % IJ SOLN
INTRAMUSCULAR | Status: DC | PRN
  Administered 2020-04-13: 10 mL

## 2020-04-13 MED ORDER — HEPARIN (PORCINE) IN NACL 1000-0.9 UT/500ML-% IV SOLN
INTRAVENOUS | Status: AC
  Filled 2020-04-13: qty 500

## 2020-04-13 MED ORDER — LABETALOL HCL 5 MG/ML IV SOLN: 10.0000 mg | INTRAVENOUS | Status: DC | PRN

## 2020-04-13 MED ORDER — HYDRALAZINE HCL 20 MG/ML IJ SOLN: 5.0000 mg | INTRAMUSCULAR | Status: DC | PRN

## 2020-04-13 SURGICAL SUPPLY — 13 items
CATH ANGIO 5F BER2 100CM (CATHETERS) ×2 IMPLANT
CATH QUICKCROSS SUPP .035X90CM (MICROCATHETER) ×2 IMPLANT
COVER DOME SNAP 22 D (MISCELLANEOUS) ×2 IMPLANT
GLIDEWIRE ADV .035X260CM (WIRE) ×2 IMPLANT
GLIDEWIRE NITREX 0.018X80X5 (WIRE) ×2
GUIDEWIRE NITREX 0.018X80X5 (WIRE) ×1 IMPLANT
KIT MICROPUNCTURE NIT STIFF (SHEATH) ×2 IMPLANT
KIT PV (KITS) ×2 IMPLANT
SHEATH PINNACLE 5F 10CM (SHEATH) ×2 IMPLANT
SHEATH PINNACLE 8F 10CM (SHEATH) ×2 IMPLANT
SHEATH PROBE COVER 6X72 (BAG) ×2 IMPLANT
TRAY PV CATH (CUSTOM PROCEDURE TRAY) ×2 IMPLANT
WIRE TORQFLEX AUST .018X40CM (WIRE) ×6 IMPLANT

## 2020-04-13 NOTE — Interval H&P Note (Signed)
History and Physical Interval Note:  04/13/2020 10:36 AM  [name] [name]  has presented today for surgery, with the diagnosis of thrombic syndrome.  The various methods of treatment have been discussed with the patient and family. After consideration of risks, benefits and other options for treatment, the patient has consented to  Procedure(s): LOWER EXTREMITY VENOGRAPHY - Left Leg (N/A) as a surgical intervention.  The patient's history has been reviewed, patient examined, no change in status, stable for surgery.  I have reviewed the patient's chart and labs.  Questions were answered to the patient's satisfaction.     Lemar Livings

## 2020-04-13 NOTE — Progress Notes (Signed)
Pt states he drank a  Half of cup of coffee with cream and sugar this am at 0800 Dr Randie Heinz informed

## 2020-04-13 NOTE — Progress Notes (Signed)
Patient was given discharge instructions. He verbalized understanding. 

## 2020-04-13 NOTE — Discharge Instructions (Signed)
Venogram A venogram, or venography, is a procedure that uses an X-ray and dye (contrast) to examine how well the veins work and how blood flows through them. Contrast helps the veins show up on X-rays. A venogram may be done:  To evaluate abnormalities in the vein.  To identify clots within veins, such as deep vein thrombosis (DVT).  To map out the veins that might be needed for another procedure. Tell a health care provider about:  Any allergies you have, especially to medicines, shellfish, iodine, and contrast.  All medicines you are taking, including vitamins, herbs, eye drops, creams, and over-the-counter medicines.  Any problems you or family members have had with anesthetic medicines.  Any blood disorders you have.  Any surgeries you have had and any complications that occurred.  Any medical conditions you have.  Whether you are pregnant, may be pregnant, or are breastfeeding.  Any history of smoking or tobacco use. What are the risks? Generally, this is a safe procedure. However, problems may occur, including:  Infection.  Bleeding.  Blood clots.  Allergic reaction to medicines or contrast.  Damage to other structures or organs.  Kidney problems.  Increased risk of cancer. Being exposed to too much radiation over a lifetime can increase the risk of cancer. The risk is small. What happens before the procedure? Medicines Ask your health care provider about:  Changing or stopping your regular medicines. This is especially important if you are taking diabetes medicines or blood thinners.  Taking medicines such as aspirin and ibuprofen. These medicines can thin your blood. Do not take these medicines unless your health care provider tells you to take them.  Taking over-the-counter medicines, vitamins, herbs, and supplements. General instructions  Follow instructions from your health care provider about eating or drinking restrictions.  You may have blood tests  to check how well your kidneys and liver are working and how well your blood can clot.  Plan to have someone take you home from the hospital or clinic. What happens during the procedure?   An IV will be inserted into one of your veins.  You may be given a medicine to help you relax (sedative).  You will lie down on an X-ray table. The table may be tilted in different directions during the procedure to help the contrast move throughout your body. Safety straps will keep you secure if the table is tilted.  If veins in your arm or leg will be examined, a band may be wrapped around that arm or leg to keep the veins full of blood. This may cause your arm or leg to feel numb.  The contrast will be injected into your IV. You may have a hot, flushed feeling as it moves throughout your body. You may also have a metallic taste in your mouth. Both of these sensations will go away after the test is complete.  You may be asked to lie in different positions or place your legs or arms in different positions.  At the end of the procedure, you may be given IV fluids to help wash or flush the contrast out of your veins.  The IV will be removed, and pressure will be applied to the IV site to prevent bleeding. A bandage (dressing) may be applied to the IV site. The exact procedure may vary among health care providers and hospitals. What can I expect after the procedure?  Your blood pressure, heart rate, breathing rate, and blood oxygen level will be monitored until you   leave the hospital or clinic.  You may be given something to eat and drink.  You may have bruising or mild discomfort in the area where the IV was inserted. Follow these instructions at home: Eating and drinking   Follow instructions from your health care provider about eating or drinking restrictions.  Drink a lot of water for the first several days after the procedure, as directed by your health care provider. This helps to flush the  contrast out of your body. Activity  Rest as told by your health care provider.  Return to your normal activities as told by your health care provider. Ask your health care provider what activities are safe for you.  If you were given a sedative during your procedure, do not drive for 24 hours or until your health care provider approves. General instructions  Check your IV insertion area every day for signs of infection. Check for: ? Redness, swelling, or pain. ? Fluid or blood. ? Warmth. ? Pus or a bad smell.  Take over-the-counter and prescription medicines only as told by your health care provider.  Keep all follow-up visits as told by your health care provider. This is important. Contact a health care provider if:  Your skin becomes itchy or you develop a rash or hives.  You have a fever that does not get better with medicine.  You feel nauseous or you vomit.  You have redness, swelling, or pain around the insertion site.  You have fluid or blood coming from the insertion site.  Your insertion area feels warm to the touch.  You have pus or a bad smell coming from the insertion site. Get help right away if you:  Have shortness of breath or difficulty breathing.  Develop chest pain.  Faint.  Feel very dizzy. These symptoms may represent a serious problem that is an emergency. Do not wait to see if the symptoms will go away. Get medical help right away. Call your local emergency services (911 in the [place] Do not drive yourself to the hospital. Summary  A venogram, or venography, is a procedure that uses an X-ray and contrast dye to check how well the veins work and how blood flows through them.  An IV will be inserted into one of your veins in order to inject the contrast.  During the exam, you will lie on an X-ray table. The table may be tilted in different directions during the procedure to help the contrast move throughout your body. Safety straps will keep you  secure.  After the procedure, you will need to drink a lot of water to help wash or flush the contrast out of your body. This information is not intended to replace advice given to you by your health care provider. Make sure you discuss any questions you have with your health care provider. Document Revised: 11/10/2018 Document Reviewed: 11/10/2018 Elsevier Patient Education  2020 [employer]  

## 2020-04-13 NOTE — Op Note (Signed)
    Patient name: [name] [name] MRN: [id number] DOB: 08/27/1980 Sex: male  04/13/2020 Pre-operative Diagnosis: Chronic left lower extremity DVT Post-operative diagnosis:  Same Surgeon:  Apolinar Junes C. Randie Heinz, MD Procedure Performed: 1.  Ultrasound-guided cannulation left popliteal vein 2.  Left lower extremity and central venography 3.  Moderate sedation with fentanyl and Versed for 52 minutes  Indications: 39 year old male with a history of balloon angioplasty of the right external and common femoral veins.  He has significant history of DVT with history of IVC filter placement prior to all this.  More recently he had a left lower extremity swelling and DVT and was seen in our office with post thrombotic syndrome.  He was subsequently indicated for left lower extremity venography with possible intervention.  Findings: The left popliteal veins were occluded he had collateralization to the femoral veins.  Left common femoral vein was occluded there was reflux into the profunda and the saphenous vein.  The common and external iliac veins on the left appeared patent as did the filter I was never able to get a wire as high as the external iliac vein on the left.  I was able to get a catheter into the saphenous vein on the left in a retrograde fashion and this demonstrated competency is very little contrast flowed the reverse direction and it did empty.  Patient will need consideration of CT venogram with plus or minus attempt at crossing his common femoral vein with possible stenting from a retrograde fashion from the neck and also consideration of filter removal.   Procedure:  The patient was identified in the holding area and taken to room 8.  The patient was then placed prone on the table and prepped and draped in the usual sterile fashion.  A time out was called.  Ultrasound was used to evaluate the popliteal fossa on the left.  There were multiple collaterals.  I ultimately cannulated the popliteal  vein on 2 separate occasions unfortunately it appeared on venography that his popliteal veins were occluded.  I was able to traverse via collaterals into the femoral vein and performed venogram.  Unfortunately identified a left common femoral vein that was occluded with reflux into his profunda and greater saphenous vein.  I did cannulated greater saphenous vein performed reflux venogram this did appear to be competent.  I also performed central venogram it appeared that his common and external iliac veins were patent and his filter was patent although I was unable to perform intravascular ultrasound as I could not get a wire past the common femoral vein.  With this the wire and catheters were removed and the sheath was removed and pressure was held.  He will need consideration of CT venogram with plus or minus attempt at treatment from a retrograde fashion from the neck and possibly from the saphenous vein on the left.  He tolerated procedure without any complication.   Contrast: 70cc  Ardyn Forge C. Randie Heinz, MD [facility] Office: [phone] Pager: [phone]

## 2020-04-14 ENCOUNTER — Encounter (HOSPITAL_COMMUNITY): Payer: Self-pay | Admitting: Vascular Surgery

## 2020-04-14 MED FILL — Lidocaine HCl Local Preservative Free (PF) Inj 1%: INTRAMUSCULAR | Qty: 30 | Status: AC

## 2020-04-20 ENCOUNTER — Other Ambulatory Visit: Payer: Self-pay

## 2020-04-20 DIAGNOSIS — I82402 Acute embolism and thrombosis of unspecified deep veins of left lower extremity: Secondary | ICD-10-CM

## 2020-05-06 ENCOUNTER — Other Ambulatory Visit: Payer: Self-pay

## 2020-05-06 ENCOUNTER — Other Ambulatory Visit: Payer: 59

## 2020-05-06 DIAGNOSIS — Z20822 Contact with and (suspected) exposure to covid-19: Secondary | ICD-10-CM

## 2020-05-08 ENCOUNTER — Encounter: Payer: Self-pay | Admitting: Internal Medicine

## 2020-05-08 LAB — SARS-COV-2, NAA 2 DAY TAT

## 2020-05-08 LAB — NOVEL CORONAVIRUS, NAA: SARS-CoV-2, NAA: DETECTED — AB

## 2020-05-11 ENCOUNTER — Inpatient Hospital Stay: Admission: RE | Admit: 2020-05-11 | Payer: 59 | Source: Ambulatory Visit

## 2020-05-15 ENCOUNTER — Encounter: Payer: 59 | Admitting: Vascular Surgery

## 2020-06-02 ENCOUNTER — Other Ambulatory Visit: Payer: Self-pay

## 2020-06-02 DIAGNOSIS — I87002 Postthrombotic syndrome without complications of left lower extremity: Secondary | ICD-10-CM

## 2020-06-23 ENCOUNTER — Ambulatory Visit
Admission: RE | Admit: 2020-06-23 | Discharge: 2020-06-23 | Disposition: A | Discharge status: Home / Self Care | Payer: 59 | Source: Ambulatory Visit | Attending: Vascular Surgery | Admitting: Vascular Surgery

## 2020-06-23 ENCOUNTER — Other Ambulatory Visit: Payer: Self-pay

## 2020-06-23 DIAGNOSIS — I87002 Postthrombotic syndrome without complications of left lower extremity: Secondary | ICD-10-CM

## 2020-06-23 MED ORDER — IOPAMIDOL (ISOVUE-370) INJECTION 76%
75.0000 mL | Freq: Once | INTRAVENOUS | Status: AC | PRN
  Administered 2020-06-23: 75 mL via INTRAVENOUS

## 2020-06-26 ENCOUNTER — Other Ambulatory Visit: Payer: Self-pay

## 2020-06-26 ENCOUNTER — Ambulatory Visit (INDEPENDENT_AMBULATORY_CARE_PROVIDER_SITE_OTHER): Payer: 59 | Admitting: Vascular Surgery

## 2020-06-26 ENCOUNTER — Encounter: Payer: Self-pay | Admitting: Vascular Surgery

## 2020-06-26 VITALS — BP 120/68 | HR 83 | Temp 97.8°F | Resp 20 | Ht 72.0 in | Wt 245.0 lb

## 2020-06-26 DIAGNOSIS — I87002 Postthrombotic syndrome without complications of left lower extremity: Secondary | ICD-10-CM | POA: Diagnosis not present

## 2020-06-26 NOTE — Progress Notes (Signed)
 Patient ID: [name] [name], male   DOB: 06/13/1980, 39 y.o.   MRN: [id number]  Reason for Consult: Routine Post Op   Referred by Winters, Steven, MD  Subjective:     HPI:  [name] [name] is a 39 y.o. male history of pulmonary embolus and IVC filter placed in approximately 2009.  He initially underwent balloon angioplasty of his right external iliac vein and common femoral vein in 2018.  More recently he was evaluated for left lower extremity swelling which is quite life limiting.  Patient does remain on Eliquis for history of factor V Leiden.  He does not have any back or abdominal pain.  He is undergone venography which demonstrated occlusion of his popliteal veins with patent superficial femoral veins and greater saphenous vein and then occlusion of his common femoral vein with patent veins above that.  He is now here to follow-up after CT venogram.  Has persistent swelling of the left leg with skin changes bilaterally but no pain on the right side.  He does wear compression stockings daily.  Past Medical History:  Diagnosis Date  . Anxiety and depression   . DVT (deep venous thrombosis) (HCC)    Pt unsure of date   . Factor V Leiden mutation (HCC)    With resultant hypercoaguable state, Hx of DVT, PE, SP IVC filter placement, on chronic coumadin and requires high doses to maintine IRN 2-3.  . GERD (gastroesophageal reflux disease)    history of  . Myocardial infarction (HCC)   . PE (pulmonary thromboembolism) (HCC)    unsure of date  . Pneumonia   . Pneumothorax 8/11   HX of, requiring chest tube/hospitalization.   . Venous stasis    bilateral LE.   History reviewed. No pertinent family history. Past Surgical History:  Procedure Laterality Date  . ANGIOPLASTY ILLIAC ARTERY Right 07/11/2016   Procedure: balloon ANGIOPLASTY of Inferior vena cava, right external iliac vein, right common femoral vein;  Surgeon: Brandon Christopher Cain, MD;  Location: [facility] OR;  Service: Vascular;   Laterality: Right;  . INTRAVASCULAR ULTRASOUND/IVUS Right 07/11/2016   Procedure: INTRAVASCULAR ULTRASOUND/IVUS of right popliteal vein, right common femoral vein, right femoral vein, right exterternal iliac vein, and right internal iliac vein;  Surgeon: Brandon Christopher Cain, MD;  Location: [facility] OR;  Service: Vascular;  Laterality: Right;  . LOWER EXTREMITY ANGIOGRAPHY N/A 07/12/2016   Procedure: LYSIS RECHECK;  Surgeon: Vance W Brabham, MD;  Location: [facility] INVASIVE CV LAB;  Service: Cardiovascular;  Laterality: N/A;  . LOWER EXTREMITY VENOGRAPHY N/A 04/13/2020   Procedure: LOWER EXTREMITY VENOGRAPHY - Left Leg;  Surgeon: Cain, Brandon Christopher, MD;  Location: [facility] INVASIVE CV LAB;  Service: Cardiovascular;  Laterality: N/A;  . placement of IVC filter  8/09  . Placement of large-bore right chest tube  8/11  . right minithoracotomy with evacuation of hematoma  8/09  . ULTRASOUND GUIDANCE FOR VASCULAR ACCESS Right 07/11/2016   Procedure: ULTRASOUND GUIDANCE FOR VASCULAR ACCESS;  Surgeon: Brandon Christopher Cain, MD;  Location: [facility] OR;  Service: Vascular;  Laterality: Right;    Short Social History:  Social History   Tobacco Use  . Smoking status: Former Smoker    Packs/day: 1.00    Years: 20.00    Pack years: 20.00    Types: Cigarettes    Start date: 07/07/2016  . Smokeless tobacco: Former User  . Tobacco comment: stopped 1 month ago  Substance Use Topics  . Alcohol use: No    No   Known Allergies  Current Outpatient Medications  Medication Sig Dispense Refill  . acetaminophen (TYLENOL) 325 MG tablet Take 2 tablets (650 mg total) by mouth every 6 (six) hours as needed for mild pain (or temp > 100).    Marland Kitchen apixaban (ELIQUIS) 5 MG TABS tablet Take 1 tablet (5 mg total) by mouth 2 (two) times daily. 60 tablet 6   No current facility-administered medications for this visit.    Review of Systems  Constitutional: Positive for fatigue.  HENT: HENT negative.  Eyes: Eyes negative.   Cardiovascular: Positive for leg swelling.  GI: Gastrointestinal negative.  Musculoskeletal: Musculoskeletal negative.  Skin: Skin negative.  Neurological: Neurological negative. Hematologic: Hematologic/lymphatic negative.  Psychiatric: Psychiatric negative.        Objective:  Objective   Vitals:   06/26/20 0908  BP: 120/68  Pulse: 83  Resp: 20  Temp: 97.8 F (36.6 C)  SpO2: 98%  Weight: 245 lb (111.1 kg)  Height: 6' (1.829 m)   Body mass index is 33.23 kg/m.  Physical Exam HENT:     Head: Normocephalic.     Nose:     Comments: Wearing a mask Eyes:     Extraocular Movements: Extraocular movements intact.     Pupils: Pupils are equal, round, and reactive to light.  Cardiovascular:     Rate and Rhythm: Normal rate.     Pulses: Normal pulses.  Pulmonary:     Effort: Pulmonary effort is normal.  Abdominal:     General: Abdomen is flat.     Palpations: Abdomen is soft.  Musculoskeletal:     Right lower leg: Edema present.     Left lower leg: Edema present.     Comments: Right leg 42.5/ Left 45 cm  Skin:    Capillary Refill: Capillary refill takes less than 2 seconds.     Comments: Skin changes bilaterally consistent with venous insufficiency  Neurological:     Mental Status: He is alert.  Psychiatric:        Mood and Affect: Mood normal.        Behavior: Behavior normal.        Thought Content: Thought content normal.        Judgment: Judgment normal.     Data: CT IMPRESSION VASCULAR  1. Distal infrarenal retrievable IVC filter in place. The filter is tilted with the recovery hook abutting the right posterolateral caval wall. Additionally, numerous of the filter struts have penetrated the caval wall and reside within the surrounding soft tissues coming in close approximation with the right common iliac artery and the L5 vertebral body. Recommend consideration of IVC filter retrieval unless PE prophylaxis is still clinically necessary. 2. Focal  narrowing of the left common femoral vein likely secondary to prior DVT. This may represent the source of the patient's left lower extremity post thrombotic symptoms. 3. The iliac veins are free from thrombus and there is no evidence of stenosis or compression physiology/May-Thurner.  NON-VASCULAR  1. No acute abnormality within the abdomen or pelvis. 2. Colonic diverticular disease without CT evidence of active inflammation.     Assessment/Plan:     40 year old male with life limiting swelling of his left lower extremity.  I have reviewed his CT scan with him.  We will plan to proceed to the operating room for right IJ and left possible femoral vein approach to removing his IVC filter and possible treatment of his left common femoral vein which appears to be a significant limiting factor for  him.  He also has popliteal vein occlusive disease from our recent venogram.  We discussed that he will never have perfectly normal left lower extremity but possibly we can improve the swelling with treatment of his common femoral vein on the left.  We will hold Eliquis x48 hours.  He will likely need observation after procedure.  Patient demonstrates good understanding of the risk and benefits as well as the possibility that we cannot treat him at the time including inability to remove the filter safely and inability to cross his occlusive disease in his left common femoral vein.     Maeola Harman MD [facility]

## 2020-06-26 NOTE — H&P (View-Only) (Signed)
Patient ID: [name] [name], male   DOB: Aug 22, 1980, 40 y.o.   MRN: [id number]  Reason for Consult: Routine Post Op   Referred by Dolan Amen, MD  Subjective:     HPI:  [name] [name] is a 40 y.o. male history of pulmonary embolus and IVC filter placed in approximately 2009.  He initially underwent balloon angioplasty of his right external iliac vein and common femoral vein in 2018.  More recently he was evaluated for left lower extremity swelling which is quite life limiting.  Patient does remain on Eliquis for history of factor V Leiden.  He does not have any back or abdominal pain.  He is undergone venography which demonstrated occlusion of his popliteal veins with patent superficial femoral veins and greater saphenous vein and then occlusion of his common femoral vein with patent veins above that.  He is now here to follow-up after CT venogram.  Has persistent swelling of the left leg with skin changes bilaterally but no pain on the right side.  He does wear compression stockings daily.  Past Medical History:  Diagnosis Date  . Anxiety and depression   . DVT (deep venous thrombosis) (HCC)    Pt unsure of date   . Factor V Leiden mutation (HCC)    With resultant hypercoaguable state, Hx of DVT, PE, SP IVC filter placement, on chronic coumadin and requires high doses to maintine IRN 2-3.  Marland Kitchen GERD (gastroesophageal reflux disease)    history of  . Myocardial infarction (HCC)   . PE (pulmonary thromboembolism) (HCC)    unsure of date  . Pneumonia   . Pneumothorax 8/11   HX of, requiring chest tube/hospitalization.   . Venous stasis    bilateral LE.   History reviewed. No pertinent family history. Past Surgical History:  Procedure Laterality Date  . ANGIOPLASTY ILLIAC ARTERY Right 07/11/2016   Procedure: balloon ANGIOPLASTY of Inferior vena cava, right external iliac vein, right common femoral vein;  Surgeon: Maeola Harman, MD;  Location: [facility] OR;  Service: Vascular;   Laterality: Right;  . INTRAVASCULAR ULTRASOUND/IVUS Right 07/11/2016   Procedure: INTRAVASCULAR ULTRASOUND/IVUS of right popliteal vein, right common femoral vein, right femoral vein, right exterternal iliac vein, and right internal iliac vein;  Surgeon: Maeola Harman, MD;  Location: [facility] OR;  Service: Vascular;  Laterality: Right;  . LOWER EXTREMITY ANGIOGRAPHY N/A 07/12/2016   Procedure: LYSIS RECHECK;  Surgeon: Nada Libman, MD;  Location: [facility] INVASIVE CV LAB;  Service: Cardiovascular;  Laterality: N/A;  . LOWER EXTREMITY VENOGRAPHY N/A 04/13/2020   Procedure: LOWER EXTREMITY VENOGRAPHY - Left Leg;  Surgeon: Maeola Harman, MD;  Location: [facility] INVASIVE CV LAB;  Service: Cardiovascular;  Laterality: N/A;  . placement of IVC filter  8/09  . Placement of large-bore right chest tube  8/11  . right minithoracotomy with evacuation of hematoma  8/09  . ULTRASOUND GUIDANCE FOR VASCULAR ACCESS Right 07/11/2016   Procedure: ULTRASOUND GUIDANCE FOR VASCULAR ACCESS;  Surgeon: Maeola Harman, MD;  Location: [facility] OR;  Service: Vascular;  Laterality: Right;    Short Social History:  Social History   Tobacco Use  . Smoking status: Former Smoker    Packs/day: 1.00    Years: 20.00    Pack years: 20.00    Types: Cigarettes    Start date: 07/07/2016  . Smokeless tobacco: Former Neurosurgeon  . Tobacco comment: stopped 1 month ago  Substance Use Topics  . Alcohol use: No    No  Known Allergies  Current Outpatient Medications  Medication Sig Dispense Refill  . acetaminophen (TYLENOL) 325 MG tablet Take 2 tablets (650 mg total) by mouth every 6 (six) hours as needed for mild pain (or temp > 100).    Marland Kitchen apixaban (ELIQUIS) 5 MG TABS tablet Take 1 tablet (5 mg total) by mouth 2 (two) times daily. 60 tablet 6   No current facility-administered medications for this visit.    Review of Systems  Constitutional: Positive for fatigue.  HENT: HENT negative.  Eyes: Eyes negative.   Cardiovascular: Positive for leg swelling.  GI: Gastrointestinal negative.  Musculoskeletal: Musculoskeletal negative.  Skin: Skin negative.  Neurological: Neurological negative. Hematologic: Hematologic/lymphatic negative.  Psychiatric: Psychiatric negative.        Objective:  Objective   Vitals:   06/26/20 0908  BP: 120/68  Pulse: 83  Resp: 20  Temp: 97.8 F (36.6 C)  SpO2: 98%  Weight: 245 lb (111.1 kg)  Height: 6' (1.829 m)   Body mass index is 33.23 kg/m.  Physical Exam HENT:     Head: Normocephalic.     Nose:     Comments: Wearing a mask Eyes:     Extraocular Movements: Extraocular movements intact.     Pupils: Pupils are equal, round, and reactive to light.  Cardiovascular:     Rate and Rhythm: Normal rate.     Pulses: Normal pulses.  Pulmonary:     Effort: Pulmonary effort is normal.  Abdominal:     General: Abdomen is flat.     Palpations: Abdomen is soft.  Musculoskeletal:     Right lower leg: Edema present.     Left lower leg: Edema present.     Comments: Right leg 42.5/ Left 45 cm  Skin:    Capillary Refill: Capillary refill takes less than 2 seconds.     Comments: Skin changes bilaterally consistent with venous insufficiency  Neurological:     Mental Status: He is alert.  Psychiatric:        Mood and Affect: Mood normal.        Behavior: Behavior normal.        Thought Content: Thought content normal.        Judgment: Judgment normal.     Data: CT IMPRESSION VASCULAR  1. Distal infrarenal retrievable IVC filter in place. The filter is tilted with the recovery hook abutting the right posterolateral caval wall. Additionally, numerous of the filter struts have penetrated the caval wall and reside within the surrounding soft tissues coming in close approximation with the right common iliac artery and the L5 vertebral body. Recommend consideration of IVC filter retrieval unless PE prophylaxis is still clinically necessary. 2. Focal  narrowing of the left common femoral vein likely secondary to prior DVT. This may represent the source of the patient's left lower extremity post thrombotic symptoms. 3. The iliac veins are free from thrombus and there is no evidence of stenosis or compression physiology/May-Thurner.  NON-VASCULAR  1. No acute abnormality within the abdomen or pelvis. 2. Colonic diverticular disease without CT evidence of active inflammation.     Assessment/Plan:     40 year old male with life limiting swelling of his left lower extremity.  I have reviewed his CT scan with him.  We will plan to proceed to the operating room for right IJ and left possible femoral vein approach to removing his IVC filter and possible treatment of his left common femoral vein which appears to be a significant limiting factor for  him.  He also has popliteal vein occlusive disease from our recent venogram.  We discussed that he will never have perfectly normal left lower extremity but possibly we can improve the swelling with treatment of his common femoral vein on the left.  We will hold Eliquis x48 hours.  He will likely need observation after procedure.  Patient demonstrates good understanding of the risk and benefits as well as the possibility that we cannot treat him at the time including inability to remove the filter safely and inability to cross his occlusive disease in his left common femoral vein.     Maeola Harman MD [facility]

## 2020-06-30 ENCOUNTER — Other Ambulatory Visit (HOSPITAL_COMMUNITY): Payer: 59

## 2020-06-30 NOTE — Progress Notes (Signed)
Surgical Instructions    Your procedure is scheduled on 07/02/20.  Report to [facility] Main Entrance "A" at 08:40 A.M., then check in with the Admitting office.  Call this number if you have problems the morning of surgery:  [phone]   If you have any questions prior to your surgery date call [phone]: Open Monday-Friday 8am-4pm    Remember:  Do not eat or drink after midnight the night before your surgery     Take these medicines the morning of surgery with A SIP OF WATER  acetaminophen (TYLENOL) if needed   As of today, STOP taking any Aspirin (unless otherwise instructed by your surgeon) Aleve, Naproxen, Ibuprofen, Motrin, Advil, Goody's, BC's, all herbal medications, fish oil, and all vitamins.  Please stop apixaban (ELIQUIS) 48 hours prior to surgery. Your last dose will be 06/29/2020.                     Do not wear jewelry, make up, or nail polish            Do not wear lotions, powders, perfumes/colognes, or deodorant.            Men may shave face and neck.            Do not bring valuables to the hospital.            [facility] is not responsible for any belongings or valuables.  Do NOT Smoke (Tobacco/Vaping) or drink Alcohol 24 hours prior to your procedure If you use a CPAP at night, you may bring all equipment for your overnight stay.   Contacts, glasses, dentures or bridgework may not be worn into surgery, please bring cases for these belongings   For patients admitted to the hospital, discharge time will be determined by your treatment team.   Patients discharged the day of surgery will not be allowed to drive home, and someone needs to stay with them for 24 hours.    Special instructions:   - Preparing For Surgery  Before surgery, you can play an important role. Because skin is not sterile, your skin needs to be as free of germs as possible. You can reduce the number of germs on your skin by washing with CHG (chlorahexidine gluconate) Soap  before surgery.  CHG is an antiseptic cleaner which kills germs and bonds with the skin to continue killing germs even after washing.    Oral Hygiene is also important to reduce your risk of infection.  Remember - BRUSH YOUR TEETH THE MORNING OF SURGERY WITH YOUR REGULAR TOOTHPASTE  Please do not use if you have an allergy to CHG or antibacterial soaps. If your skin becomes reddened/irritated stop using the CHG.  Do not shave (including legs and underarms) for at least 48 hours prior to first CHG shower. It is OK to shave your face.  Please follow these instructions carefully.   1. Shower the NIGHT BEFORE SURGERY and the MORNING OF SURGERY  2. If you chose to wash your hair, wash your hair first as usual with your normal shampoo.  3. After you shampoo, rinse your hair and body thoroughly to remove the shampoo.  4. Wash Face and genitals (private parts) with your normal soap.   5.  Shower the NIGHT BEFORE SURGERY and the MORNING OF SURGERY with CHG Soap.   6. Use CHG Soap as you would any other liquid soap. You can apply CHG directly to the skin and wash gently  with a scrungie or a clean washcloth.   7. Apply the CHG Soap to your body ONLY FROM THE NECK DOWN.  Do not use on open wounds or open sores. Avoid contact with your eyes, ears, mouth and genitals (private parts). Wash Face and genitals (private parts)  with your normal soap.   8. Wash thoroughly, paying special attention to the area where your surgery will be performed.  9. Thoroughly rinse your body with warm water from the neck down.  10. DO NOT shower/wash with your normal soap after using and rinsing off the CHG Soap.  11. Pat yourself dry with a CLEAN TOWEL.  12. Wear CLEAN PAJAMAS to bed the night before surgery  13. Place CLEAN SHEETS on your bed the night before your surgery  14. DO NOT SLEEP WITH PETS.   Day of Surgery: Take a shower.  Wear Clean/Comfortable clothing the morning of surgery Do not apply any  deodorants/lotions.   Remember to brush your teeth WITH YOUR REGULAR TOOTHPASTE.   Please read over the following fact sheets that you were given.

## 2020-07-01 ENCOUNTER — Other Ambulatory Visit: Payer: Self-pay

## 2020-07-01 ENCOUNTER — Encounter (HOSPITAL_COMMUNITY): Payer: Self-pay

## 2020-07-01 ENCOUNTER — Telehealth: Payer: Self-pay

## 2020-07-01 ENCOUNTER — Encounter (HOSPITAL_COMMUNITY)
Admission: RE | Admit: 2020-07-01 | Discharge: 2020-07-01 | Disposition: A | Discharge status: Home / Self Care | Payer: 59 | Source: Ambulatory Visit | Attending: Vascular Surgery | Admitting: Vascular Surgery

## 2020-07-01 DIAGNOSIS — Z01812 Encounter for preprocedural laboratory examination: Secondary | ICD-10-CM | POA: Insufficient documentation

## 2020-07-01 LAB — SURGICAL PCR SCREEN
MRSA, PCR: NEGATIVE
Staphylococcus aureus: NEGATIVE

## 2020-07-01 NOTE — Telephone Encounter (Signed)
Patient took eliquis yesterday - was supposed to hold for procedure on 3/17. Spoke with [facility] - okay to proceed if patient does not take any today. Called patient and left VM to inform.

## 2020-07-01 NOTE — Progress Notes (Signed)
PCP - Dolan Amen Cardiologist - denies Vascular: Dr. Randie Heinz  PPM/ICD - denies   Chest x-ray -n/a  EKG - 12/30/19 Stress Test - denies ECHO - 11/13/07 Cardiac Cath - denies  Sleep Study - denies   Patient instructed to hold all Aspirin, NSAID's, herbal medications, fish oil and vitamins 7 days prior to surgery. Please stop taking your eliquis 48 hours prior to surgery. Your last dose will be 06/29/20. Pt stated his last dose was 06/30/20 am and pm. Dr. Franco Nones office notified.   ERAS Protcol -no   COVID TEST- covid positive on 05/06/20   Anesthesia review: yes, history of clotting disorder and PEs. [name] notified at PAT appt.   Patient denies shortness of breath, fever, cough and chest pain at PAT appointment   All instructions explained to the patient, with a verbal understanding of the material. Patient agrees to go over the instructions while at home for a better understanding. Patient also instructed to self quarantine after being tested for COVID-19. The opportunity to ask questions was provided.

## 2020-07-02 ENCOUNTER — Ambulatory Visit (HOSPITAL_COMMUNITY): Payer: 59 | Admitting: Certified Registered Nurse Anesthetist

## 2020-07-02 ENCOUNTER — Encounter (HOSPITAL_COMMUNITY)
Admission: RE | Disposition: A | Discharge status: Home / Self Care | Payer: Self-pay | Source: Home / Self Care | Attending: Vascular Surgery

## 2020-07-02 ENCOUNTER — Encounter (HOSPITAL_COMMUNITY): Payer: Self-pay | Admitting: Vascular Surgery

## 2020-07-02 ENCOUNTER — Ambulatory Visit (HOSPITAL_COMMUNITY): Payer: 59

## 2020-07-02 ENCOUNTER — Observation Stay (HOSPITAL_COMMUNITY)
Admission: RE | Admit: 2020-07-02 | Discharge: 2020-07-03 | Disposition: A | Discharge status: Home / Self Care | Payer: 59 | Attending: Vascular Surgery | Admitting: Vascular Surgery

## 2020-07-02 DIAGNOSIS — I82402 Acute embolism and thrombosis of unspecified deep veins of left lower extremity: Secondary | ICD-10-CM | POA: Diagnosis not present

## 2020-07-02 DIAGNOSIS — I82419 Acute embolism and thrombosis of unspecified femoral vein: Secondary | ICD-10-CM | POA: Diagnosis not present

## 2020-07-02 DIAGNOSIS — Z7901 Long term (current) use of anticoagulants: Secondary | ICD-10-CM | POA: Insufficient documentation

## 2020-07-02 DIAGNOSIS — I771 Stricture of artery: Secondary | ICD-10-CM | POA: Diagnosis not present

## 2020-07-02 DIAGNOSIS — Z87891 Personal history of nicotine dependence: Secondary | ICD-10-CM | POA: Insufficient documentation

## 2020-07-02 DIAGNOSIS — D6851 Activated protein C resistance: Principal | ICD-10-CM | POA: Insufficient documentation

## 2020-07-02 DIAGNOSIS — T82525A Displacement of umbrella device, initial encounter: Secondary | ICD-10-CM | POA: Diagnosis not present

## 2020-07-02 DIAGNOSIS — I871 Compression of vein: Secondary | ICD-10-CM

## 2020-07-02 DIAGNOSIS — I87092 Postthrombotic syndrome with other complications of left lower extremity: Secondary | ICD-10-CM | POA: Diagnosis not present

## 2020-07-02 DIAGNOSIS — I82512 Chronic embolism and thrombosis of left femoral vein: Secondary | ICD-10-CM

## 2020-07-02 HISTORY — PX: VENOGRAM: SHX5497

## 2020-07-02 LAB — COMPREHENSIVE METABOLIC PANEL
ALT: 29 U/L (ref 0–44)
AST: 24 U/L (ref 15–41)
Albumin: 3.6 g/dL (ref 3.5–5.0)
Alkaline Phosphatase: 60 U/L (ref 38–126)
Anion gap: 7 (ref 5–15)
BUN: 9 mg/dL (ref 6–20)
CO2: 22 mmol/L (ref 22–32)
Calcium: 8.9 mg/dL (ref 8.9–10.3)
Chloride: 107 mmol/L (ref 98–111)
Creatinine, Ser: 0.91 mg/dL (ref 0.61–1.24)
GFR, Estimated: >60 mL/min (ref >60)
Glucose, Bld: 127 mg/dL — ABNORMAL HIGH (ref 70–99)
Potassium: 4.6 mmol/L (ref 3.5–5.1)
Sodium: 136 mmol/L (ref 135–145)
Total Bilirubin: 0.9 mg/dL (ref 0.3–1.2)
Total Protein: 6.4 g/dL — ABNORMAL LOW (ref 6.5–8.1)

## 2020-07-02 LAB — PREPARE RBC (CROSSMATCH)

## 2020-07-02 LAB — POCT I-STAT, CHEM 8
BUN: 13 mg/dL (ref 6–20)
Calcium, Ion: 1.06 mmol/L — ABNORMAL LOW (ref 1.15–1.40)
Chloride: 109 mmol/L (ref 98–111)
Creatinine, Ser: 0.7 mg/dL (ref 0.61–1.24)
Glucose, Bld: 97 mg/dL (ref 70–99)
HCT: 40 % (ref 39.0–52.0)
Hemoglobin: 13.6 g/dL (ref 13.0–17.0)
Potassium: 3.6 mmol/L (ref 3.5–5.1)
Sodium: 141 mmol/L (ref 135–145)
TCO2: 24 mmol/L (ref 22–32)

## 2020-07-02 LAB — CBC
HCT: 43.2 % (ref 39.0–52.0)
Hemoglobin: 14.9 g/dL (ref 13.0–17.0)
MCH: 32.3 pg (ref 26.0–34.0)
MCHC: 34.5 g/dL (ref 30.0–36.0)
MCV: 93.5 fL (ref 80.0–100.0)
Platelets: 216 10*3/uL (ref 150–400)
RBC: 4.62 MIL/uL (ref 4.22–5.81)
RDW: 13.4 % (ref 11.5–15.5)
WBC: 8 10*3/uL (ref 4.0–10.5)
nRBC: 0 % (ref 0.0–0.2)

## 2020-07-02 SURGERY — REMOVAL, FILTER, VENA CAVA
Anesthesia: General | Site: Groin

## 2020-07-02 MED ORDER — SODIUM CHLORIDE 0.9 % IV SOLN
INTRAVENOUS | Status: DC | PRN
  Administered 2020-07-02 (×2): 500 mL

## 2020-07-02 MED ORDER — PHENOL 1.4 % MT LIQD: 1.0000 | OROMUCOSAL | Status: DC | PRN

## 2020-07-02 MED ORDER — IODIXANOL 320 MG/ML IV SOLN
INTRAVENOUS | Status: DC | PRN
  Administered 2020-07-02: 130 mL

## 2020-07-02 MED ORDER — FENTANYL CITRATE (PF) 250 MCG/5ML IJ SOLN
INTRAMUSCULAR | Status: DC | PRN
  Administered 2020-07-02 (×3): 50 ug via INTRAVENOUS
  Administered 2020-07-02: 100 ug via INTRAVENOUS
  Administered 2020-07-02: 50 ug via INTRAVENOUS

## 2020-07-02 MED ORDER — LACTATED RINGERS IV SOLN: INTRAVENOUS | Status: DC

## 2020-07-02 MED ORDER — HEPARIN (PORCINE) 25000 UT/250ML-% IV SOLN
1900.0000 [IU]/h | INTRAVENOUS | Status: DC
  Administered 2020-07-02: 1750 [IU]/h via INTRAVENOUS
  Filled 2020-07-02 (×2): qty 250

## 2020-07-02 MED ORDER — AMISULPRIDE (ANTIEMETIC) 5 MG/2ML IV SOLN: 10.0000 mg | Freq: Once | INTRAVENOUS | Status: DC | PRN

## 2020-07-02 MED ORDER — ALUM & MAG HYDROXIDE-SIMETH 200-200-20 MG/5ML PO SUSP: 15.0000 mL | ORAL | Status: DC | PRN

## 2020-07-02 MED ORDER — SODIUM CHLORIDE 0.9% IV SOLUTION: Freq: Once | INTRAVENOUS | Status: DC

## 2020-07-02 MED ORDER — ACETAMINOPHEN 325 MG PO TABS: 325.0000 mg | ORAL_TABLET | ORAL | Status: DC | PRN

## 2020-07-02 MED ORDER — CHLORHEXIDINE GLUCONATE 0.12 % MT SOLN
15.0000 mL | Freq: Once | OROMUCOSAL | Status: AC
  Administered 2020-07-02: 15 mL via OROMUCOSAL
  Filled 2020-07-02: qty 15

## 2020-07-02 MED ORDER — CEFAZOLIN SODIUM-DEXTROSE 2-4 GM/100ML-% IV SOLN
2.0000 g | INTRAVENOUS | Status: AC
  Administered 2020-07-02: 2 g via INTRAVENOUS
  Filled 2020-07-02: qty 100

## 2020-07-02 MED ORDER — SODIUM CHLORIDE 0.9 % IV SOLN: INTRAVENOUS | Status: DC

## 2020-07-02 MED ORDER — OXYCODONE-ACETAMINOPHEN 5-325 MG PO TABS
1.0000 | ORAL_TABLET | ORAL | Status: DC | PRN
  Administered 2020-07-03: 2 via ORAL
  Filled 2020-07-02: qty 2

## 2020-07-02 MED ORDER — MEPERIDINE HCL 25 MG/ML IJ SOLN
6.2500 mg | INTRAMUSCULAR | Status: DC | PRN
Start: 2020-07-02 — End: 2020-07-02

## 2020-07-02 MED ORDER — ACETAMINOPHEN 650 MG RE SUPP: 325.0000 mg | RECTAL | Status: DC | PRN

## 2020-07-02 MED ORDER — FENTANYL CITRATE (PF) 250 MCG/5ML IJ SOLN
INTRAMUSCULAR | Status: AC
  Filled 2020-07-02: qty 5

## 2020-07-02 MED ORDER — HYDROMORPHONE HCL 1 MG/ML IJ SOLN
0.5000 mg | INTRAMUSCULAR | Status: DC | PRN
Start: 2020-07-02 — End: 2020-07-03
  Administered 2020-07-02 – 2020-07-03 (×3): 1 mg via INTRAVENOUS
  Filled 2020-07-02 (×3): qty 1

## 2020-07-02 MED ORDER — GUAIFENESIN-DM 100-10 MG/5ML PO SYRP: 15.0000 mL | ORAL_SOLUTION | ORAL | Status: DC | PRN

## 2020-07-02 MED ORDER — PROPOFOL 10 MG/ML IV BOLUS
INTRAVENOUS | Status: AC
  Filled 2020-07-02: qty 20

## 2020-07-02 MED ORDER — MIDAZOLAM HCL 2 MG/2ML IJ SOLN
INTRAMUSCULAR | Status: AC
  Filled 2020-07-02: qty 2

## 2020-07-02 MED ORDER — SODIUM CHLORIDE 0.9 % IV SOLN
INTRAVENOUS | Status: AC
  Filled 2020-07-02: qty 1.2

## 2020-07-02 MED ORDER — CHLORHEXIDINE GLUCONATE 4 % EX LIQD: 60.0000 mL | Freq: Once | CUTANEOUS | Status: DC

## 2020-07-02 MED ORDER — METOPROLOL TARTRATE 5 MG/5ML IV SOLN: 2.0000 mg | INTRAVENOUS | Status: DC | PRN

## 2020-07-02 MED ORDER — 0.9 % SODIUM CHLORIDE (POUR BTL) OPTIME
TOPICAL | Status: DC | PRN
  Administered 2020-07-02: 1000 mL

## 2020-07-02 MED ORDER — HYDRALAZINE HCL 20 MG/ML IJ SOLN: 5.0000 mg | INTRAMUSCULAR | Status: DC | PRN

## 2020-07-02 MED ORDER — LABETALOL HCL 5 MG/ML IV SOLN: 10.0000 mg | INTRAVENOUS | Status: DC | PRN

## 2020-07-02 MED ORDER — SENNOSIDES-DOCUSATE SODIUM 8.6-50 MG PO TABS: 1.0000 | ORAL_TABLET | Freq: Every evening | ORAL | Status: DC | PRN

## 2020-07-02 MED ORDER — DEXAMETHASONE SODIUM PHOSPHATE 10 MG/ML IJ SOLN
INTRAMUSCULAR | Status: DC | PRN
  Administered 2020-07-02: 10 mg via INTRAVENOUS

## 2020-07-02 MED ORDER — HYDROMORPHONE HCL 1 MG/ML IJ SOLN
0.2500 mg | INTRAMUSCULAR | Status: DC | PRN
  Administered 2020-07-02: 0.5 mg via INTRAVENOUS

## 2020-07-02 MED ORDER — ACETAMINOPHEN 10 MG/ML IV SOLN: 1000.0000 mg | Freq: Once | INTRAVENOUS | Status: DC | PRN

## 2020-07-02 MED ORDER — POTASSIUM CHLORIDE CRYS ER 20 MEQ PO TBCR: 20.0000 meq | EXTENDED_RELEASE_TABLET | Freq: Once | ORAL | Status: DC

## 2020-07-02 MED ORDER — MIDAZOLAM HCL 5 MG/5ML IJ SOLN
INTRAMUSCULAR | Status: DC | PRN
  Administered 2020-07-02: 2 mg via INTRAVENOUS

## 2020-07-02 MED ORDER — LACTATED RINGERS IV SOLN: INTRAVENOUS | Status: DC | PRN

## 2020-07-02 MED ORDER — ONDANSETRON HCL 4 MG/2ML IJ SOLN: 4.0000 mg | Freq: Four times a day (QID) | INTRAMUSCULAR | Status: DC | PRN

## 2020-07-02 MED ORDER — ACETAMINOPHEN 325 MG PO TABS
ORAL_TABLET | ORAL | Status: AC
  Filled 2020-07-02: qty 1

## 2020-07-02 MED ORDER — DOCUSATE SODIUM 100 MG PO CAPS
100.0000 mg | ORAL_CAPSULE | Freq: Two times a day (BID) | ORAL | Status: DC
  Administered 2020-07-02: 100 mg via ORAL
  Filled 2020-07-02: qty 1

## 2020-07-02 MED ORDER — DEXMEDETOMIDINE (PRECEDEX) IN NS 20 MCG/5ML (4 MCG/ML) IV SYRINGE
PREFILLED_SYRINGE | INTRAVENOUS | Status: DC | PRN
  Administered 2020-07-02: 4 ug via INTRAVENOUS
  Administered 2020-07-02 (×2): 8 ug via INTRAVENOUS

## 2020-07-02 MED ORDER — CEFAZOLIN SODIUM-DEXTROSE 2-4 GM/100ML-% IV SOLN
2.0000 g | Freq: Three times a day (TID) | INTRAVENOUS | Status: DC
  Administered 2020-07-02 – 2020-07-03 (×2): 2 g via INTRAVENOUS
  Filled 2020-07-02 (×3): qty 100

## 2020-07-02 MED ORDER — ACETAMINOPHEN 160 MG/5ML PO SUSP
ORAL | Status: AC
  Filled 2020-07-02: qty 5

## 2020-07-02 MED ORDER — HEPARIN SODIUM (PORCINE) 1000 UNIT/ML IJ SOLN
INTRAMUSCULAR | Status: DC | PRN
  Administered 2020-07-02: 10000 [IU] via INTRAVENOUS
  Administered 2020-07-02: 5000 [IU] via INTRAVENOUS
  Administered 2020-07-02: 3000 [IU] via INTRAVENOUS

## 2020-07-02 MED ORDER — BISACODYL 5 MG PO TBEC: 5.0000 mg | DELAYED_RELEASE_TABLET | Freq: Every day | ORAL | Status: DC | PRN

## 2020-07-02 MED ORDER — ROCURONIUM BROMIDE 10 MG/ML (PF) SYRINGE
PREFILLED_SYRINGE | INTRAVENOUS | Status: DC | PRN
  Administered 2020-07-02: 30 mg via INTRAVENOUS
  Administered 2020-07-02: 60 mg via INTRAVENOUS
  Administered 2020-07-02: 20 mg via INTRAVENOUS
  Administered 2020-07-02: 30 mg via INTRAVENOUS

## 2020-07-02 MED ORDER — SUGAMMADEX SODIUM 200 MG/2ML IV SOLN
INTRAVENOUS | Status: DC | PRN
  Administered 2020-07-02: 200 mg via INTRAVENOUS

## 2020-07-02 MED ORDER — HYDROMORPHONE HCL 1 MG/ML IJ SOLN
INTRAMUSCULAR | Status: AC
  Filled 2020-07-02: qty 1

## 2020-07-02 MED ORDER — PANTOPRAZOLE SODIUM 40 MG PO TBEC
40.0000 mg | DELAYED_RELEASE_TABLET | Freq: Every day | ORAL | Status: DC
  Filled 2020-07-02: qty 1

## 2020-07-02 MED ORDER — PHENYLEPHRINE HCL-NACL 10-0.9 MG/250ML-% IV SOLN
INTRAVENOUS | Status: DC | PRN
  Administered 2020-07-02: 15 ug/min via INTRAVENOUS

## 2020-07-02 MED ORDER — ACETAMINOPHEN 325 MG PO TABS
325.0000 mg | ORAL_TABLET | Freq: Once | ORAL | Status: AC | PRN
  Administered 2020-07-02: 650 mg via ORAL

## 2020-07-02 MED ORDER — ACETAMINOPHEN 160 MG/5ML PO SOLN: 325.0000 mg | Freq: Once | ORAL | Status: AC | PRN

## 2020-07-02 MED ORDER — PROPOFOL 10 MG/ML IV BOLUS
INTRAVENOUS | Status: DC | PRN
  Administered 2020-07-02: 50 mg via INTRAVENOUS
  Administered 2020-07-02: 150 mg via INTRAVENOUS

## 2020-07-02 MED ORDER — LIDOCAINE 2% (20 MG/ML) 5 ML SYRINGE
INTRAMUSCULAR | Status: DC | PRN
  Administered 2020-07-02: 40 mg via INTRAVENOUS

## 2020-07-02 MED ORDER — ORAL CARE MOUTH RINSE: 15.0000 mL | Freq: Once | OROMUCOSAL | Status: AC

## 2020-07-02 MED ORDER — ONDANSETRON HCL 4 MG/2ML IJ SOLN
INTRAMUSCULAR | Status: DC | PRN
  Administered 2020-07-02: 4 mg via INTRAVENOUS

## 2020-07-02 SURGICAL SUPPLY — 90 items
BAG SNAP BAND KOVER 36X36 (MISCELLANEOUS) ×3 IMPLANT
BALLN ATLAS 14X40X75 (BALLOONS) ×3
BALLN MUSTANG 10X80X75 (BALLOONS) ×3
BALLN MUSTANG 12X60X75 (BALLOONS) ×3
BALLN MUSTANG 8X80X75 (BALLOONS) ×6
BALLOON ATLAS 14X40X75 (BALLOONS) ×2 IMPLANT
BALLOON MUSTANG 10X80X75 (BALLOONS) ×2 IMPLANT
BALLOON MUSTANG 12X60X75 (BALLOONS) ×2 IMPLANT
BALLOON MUSTANG 8X80X75 (BALLOONS) ×4 IMPLANT
BLADE SURG 11 STRL SS (BLADE) ×3 IMPLANT
BNDG ELASTIC 4X5.8 VLCR STR LF (GAUZE/BANDAGES/DRESSINGS) IMPLANT
CANISTER SUCT 3000ML PPV (MISCELLANEOUS) ×3 IMPLANT
CATH ANGIO 5F BER2 100CM (CATHETERS) ×3 IMPLANT
CATH ANGIO 5F BER2 65CM (CATHETERS) ×3 IMPLANT
CATH BEACON 5 .035 40 KMP TP (CATHETERS) ×2 IMPLANT
CATH BEACON 5 .038 40 KMP TP (CATHETERS) ×3
CATH INFINITI 6F MPB2 (CATHETERS) ×3 IMPLANT
CATH SOFTOUCH MOTARJEME 5F (CATHETERS) ×3 IMPLANT
CATH THROMBEC CLEANERXT 6X65 (KITS) ×3 IMPLANT
CATH VISIONS PV .035 IVUS (CATHETERS) ×3 IMPLANT
CLIP VESOCCLUDE MED 6/CT (CLIP) IMPLANT
CLIP VESOCCLUDE SM WIDE 6/CT (CLIP) IMPLANT
COVER DOME SNAP 22 D (MISCELLANEOUS) ×3 IMPLANT
COVER PROBE W GEL 5X96 (DRAPES) ×3 IMPLANT
COVER SURGICAL LIGHT HANDLE (MISCELLANEOUS) ×3 IMPLANT
COVER WAND RF STERILE (DRAPES) ×3 IMPLANT
DERMABOND ADVANCED (GAUZE/BANDAGES/DRESSINGS) ×3
DERMABOND ADVANCED .7 DNX12 (GAUZE/BANDAGES/DRESSINGS) ×6 IMPLANT
DEVICE INFLATION ENCORE 26 (MISCELLANEOUS) ×3 IMPLANT
DEVICE TORQUE KENDALL .025-038 (MISCELLANEOUS) ×3 IMPLANT
DRAIN CHANNEL 15F RND FF W/TCR (WOUND CARE) IMPLANT
DRAPE X-RAY CASS 24X20 (DRAPES) IMPLANT
DRSG TEGADERM 4X4.75 (GAUZE/BANDAGES/DRESSINGS) ×6 IMPLANT
DRYSEAL FLEXSHEATH 12FR 33CM (SHEATH) ×1
DRYSEAL FLEXSHEATH 18FR 33CM (SHEATH) ×1
ELECT REM PT RETURN 9FT ADLT (ELECTROSURGICAL)
ELECTRODE REM PT RTRN 9FT ADLT (ELECTROSURGICAL) IMPLANT
EVACUATOR SILICONE 100CC (DRAIN) IMPLANT
GAUZE SPONGE 4X4 12PLY STRL (GAUZE/BANDAGES/DRESSINGS) ×6 IMPLANT
GLIDEWIRE ANGLED SS 035X260CM (WIRE) ×3 IMPLANT
GLOVE BIO SURGEON STRL SZ7.5 (GLOVE) ×3 IMPLANT
GOWN STRL REUS W/ TWL LRG LVL3 (GOWN DISPOSABLE) ×4 IMPLANT
GOWN STRL REUS W/ TWL XL LVL3 (GOWN DISPOSABLE) ×2 IMPLANT
GOWN STRL REUS W/TWL LRG LVL3 (GOWN DISPOSABLE) ×6
GOWN STRL REUS W/TWL XL LVL3 (GOWN DISPOSABLE) ×3
GUIDEWIRE ANGLED .035X150CM (WIRE) ×3 IMPLANT
GUIDEWIRE ANGLED .035X260CM (WIRE) ×6 IMPLANT
HEMOSTAT SNOW SURGICEL 2X4 (HEMOSTASIS) IMPLANT
KIT BASIN OR (CUSTOM PROCEDURE TRAY) ×3 IMPLANT
KIT TURNOVER KIT B (KITS) ×3 IMPLANT
NEEDLE PERC 18GX7CM (NEEDLE) ×6 IMPLANT
NS IRRIG 1000ML POUR BTL (IV SOLUTION) IMPLANT
PACK ENDO MINOR (CUSTOM PROCEDURE TRAY) ×3 IMPLANT
PAD ARMBOARD 7.5X6 YLW CONV (MISCELLANEOUS) ×6 IMPLANT
PADDING CAST ABS 6INX4YD NS (CAST SUPPLIES)
PADDING CAST ABS COTTON 6X4 NS (CAST SUPPLIES) IMPLANT
SET COLLECT BLD 21X3/4 12 (NEEDLE) IMPLANT
SET MICROPUNCTURE 5F STIFF (MISCELLANEOUS) ×6 IMPLANT
SHEATH AVANTI 11CM 5FR (SHEATH) ×3 IMPLANT
SHEATH DRYSEAL FLEX 12FR 33CM (SHEATH) ×2 IMPLANT
SHEATH DRYSEAL FLEX 18FR 33CM (SHEATH) ×2 IMPLANT
SHEATH FAST CATH 10F 12CM (SHEATH) ×3 IMPLANT
SHEATH PINNACLE 5F 10CM (SHEATH) ×3 IMPLANT
SHEATH PINNACLE 8F 10CM (SHEATH) ×3 IMPLANT
SNARE GOOSENECK 15MM (MISCELLANEOUS) ×3 IMPLANT
STENT WALLSTENT 18X90X75 (Permanent Stent) ×3 IMPLANT
STOPCOCK 4 WAY LG BORE MALE ST (IV SETS) IMPLANT
STOPCOCK MORSE 400PSI 3WAY (MISCELLANEOUS) ×6 IMPLANT
SUT ETHILON 3 0 PS 1 (SUTURE) ×6 IMPLANT
SUT MNCRL AB 4-0 PS2 18 (SUTURE) IMPLANT
SUT PROLENE 5 0 C 1 24 (SUTURE) ×3 IMPLANT
SUT PROLENE 7 0 BV 1 (SUTURE) IMPLANT
SUT SILK 2 0 SH (SUTURE) ×3 IMPLANT
SUT VIC AB 2-0 CT1 27 (SUTURE) ×3
SUT VIC AB 2-0 CT1 TAPERPNT 27 (SUTURE) ×2 IMPLANT
SUT VIC AB 3-0 SH 27 (SUTURE) ×6
SUT VIC AB 3-0 SH 27X BRD (SUTURE) ×4 IMPLANT
SYR 10ML LL (SYRINGE) ×3 IMPLANT
SYR 20ML LL LF (SYRINGE) ×3 IMPLANT
SYR 30ML LL (SYRINGE) ×3 IMPLANT
SYR MEDRAD MARK V 150ML (SYRINGE) IMPLANT
TOWEL GREEN STERILE (TOWEL DISPOSABLE) ×3 IMPLANT
TRAY FOLEY MTR SLVR 16FR STAT (SET/KITS/TRAYS/PACK) ×3 IMPLANT
TUBING EXTENTION W/L.L. (IV SETS) IMPLANT
TUBING HIGH PRESSURE 120CM (CONNECTOR) ×3 IMPLANT
UNDERPAD 30X36 HEAVY ABSORB (UNDERPADS AND DIAPERS) IMPLANT
WATER STERILE IRR 1000ML POUR (IV SOLUTION) IMPLANT
WIRE BENTSON .035X145CM (WIRE) ×6 IMPLANT
WIRE STARTER BENTSON 035X150 (WIRE) ×3 IMPLANT
WIRE TORQFLEX AUST .018X40CM (WIRE) ×3 IMPLANT

## 2020-07-02 NOTE — Anesthesia Preprocedure Evaluation (Addendum)
Anesthesia Evaluation  Patient identified by MRN, date of birth, ID band Patient awake    Reviewed: Allergy & Precautions, NPO status , Patient's Chart, lab work & pertinent test results  Airway Mallampati: II  TM Distance: >3 FB Neck ROM: Full    Dental  (+) Poor Dentition, Chipped, Missing, Loose, Dental Advisory Given   Pulmonary Current Smoker,    breath sounds clear to auscultation       Cardiovascular Normal cardiovascular exam     Neuro/Psych PSYCHIATRIC DISORDERS Anxiety Depression negative neurological ROS     GI/Hepatic Neg liver ROS, GERD  ,  Endo/Other  negative endocrine ROS  Renal/GU negative Renal ROS     Musculoskeletal negative musculoskeletal ROS (+)   Abdominal Normal abdominal exam  (+)   Peds  Hematology negative hematology ROS (+)   Anesthesia Other Findings   Reproductive/Obstetrics                            Anesthesia Physical Anesthesia Plan  ASA: III  Anesthesia Plan: General   Post-op Pain Management:    Induction: Intravenous  PONV Risk Score and Plan: 2 and Ondansetron and Midazolam  Airway Management Planned: Oral ETT  Additional Equipment: Arterial line  Intra-op Plan:   Post-operative Plan: Extubation in OR  Informed Consent: I have reviewed the patients History and Physical, chart, labs and discussed the procedure including the risks, benefits and alternatives for the proposed anesthesia with the patient or authorized representative who has indicated his/her understanding and acceptance.     Dental advisory given  Plan Discussed with: CRNA  Anesthesia Plan Comments:        Anesthesia Quick Evaluation

## 2020-07-02 NOTE — Op Note (Signed)
Patient name: [name] [name] MRN: [id number] DOB: 1980/11/15 Sex: male  07/02/2020 Pre-operative Diagnosis: Factor V Leiden, post thrombotic syndrome left lower extremity Post-operative diagnosis:  Same Surgeon:  Luanna Salk. Randie Heinz, MD Assistant: Clinton Gallant, PA Procedure Performed: 1.  Ultrasound-guided cannulation right internal jugular vein and left femoral vein mid thigh 2.  Left lower extremity and central venogram 3.  Intravascular ultrasound left common femoral vein, left external and common iliac veins and IVC 4.  Mechanical thrombectomy of inferior vena cava filter with cleaner wire 5.  Removal of IVC filter 6.  Balloon angioplasty of left common femoral and left external iliac veins with 8, 10 and 12 mm balloons 7.  Stent of left common iliac vein with 18 x 90 mm Wallstent   Indications: 40 year old male with history of bilateral lower extremity DVTs with a history of factor V Leiden.  His most recent DVT was on the left side.  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  He does have significant post thrombotic syndrome on the left and is indicated for filter removal with possible intervention of his common femoral vein as well as common iliac vein on the left.  An assistant was necessary for help with wire that was flossed as well as snaring the IVC filter.  Findings: The IVC filter hook was embedded in the IVC wall.  We were ultimately able to remove this posterior wire past the filter and the filter was removed in 1 piece.  The common iliac vein did appear to be compressed down to approximately 2 mm in anterior posterior direction at the level of the right common iliac artery and this was stented with a post stent diameter of 11.5 mm.  The common femoral vein was fully  occluded balloon angioplasty was performed there and we had a lumen of approximately 7.5 mm at completion.  I elected not to stent his common femoral vein given that there was no suitable area for inflow that I could stent from but his profunda femoris vein was patent and at completion after stenting the left common iliac vein and ballooning the common femoral vein there was minimal reflux into the profunda femoris vein on the left.   Procedure:  The patient was identified in the holding area and taken to the operating room where is placed supine on the operating table.  He was sterilely prepped and draped in the neck in the left lower extremity in the usual fashion, antibiotics were administered timeout was called.  Ultrasound was used to identify his internal jugular vein on the right which was noted to be patent and compressible.  This was cannulated with micropuncture needle followed by wire and sheath.  A Bentson wire was placed into the IVC followed by Glidewire advantage and 18 French sheath was placed under fluoroscopic guidance into the IVC just above the filter.  Attention was turned to the left thigh.  Ultrasound was used to identify an SFA what appeared to be a patent femoral vein in the thigh although it was not compressible did appear to have a lumen.  We cannulated this first with micropuncture needle but could not get anything to track and then placed an 18-gauge needle followed by Bentson wire placed a micropuncture sheath followed by Glidewire advantage followed by  5 French sheath.  Patient was fully heparinized we did redose heparin 2 additional times during the case.  We performed left lower extremity venogram as well as venogram from above.  From above and below we then used wires we were able to get through from below and snared this wire and pulled through and through access.  We then performed balloon angioplasty left femoral, common femoral external and common iliac veins on the left with  8, 10 and 12 mm balloons.  Completion demonstrated flow however there was a hold-up of flow at the common iliac vein.  We did bottle brush a 12 mm balloon we were able to get this all the way up near the filter.  We performed intravascular ultrasound of the left common femoral vein, external iliac and common iliac veins as well as IVC with the above findings.  We did improve the lumen from occluded in the common femoral vein to 7.5 mm.  There did appear to be a May Thurner syndrome.  We then turned our attention leaving our through and through wire back to the filter.  I initially tried to the filter but the hook was embedded in the wall.  I then attempted to place a balloon around the filter to move it from the wall but could not.  Ultimately I was able to get a wire around it from above and then snare this below and pulled through and through and the filter was removed in 1 piece.  Completion venogram demonstrated no IVC tear.  We then performed intravascular ultrasound again from below which demonstrated only a 2 mm lumen of the left common iliac vein at the level of the crossing of the right common iliac artery.  The left common iliac vein was otherwise very large.  We had placed an 8 French sheath in the left femoral vein for intravascular ultrasound we then placed a 12 French sheath and deployed a Wallstent across the area of the [employer]  This was postdilated with a 14 mm balloon.  Completion intravascular ultrasound demonstrated 11.5 mm lumen at the level of the previous 2 mm lumen.  Venography demonstrated patency without any filling of collaterals and brisk flow centrally.  Satisfied with this we sutured bolsters at the site of both sheaths and remove them.  Patient was then awakened from anesthesia having tolerated procedure well any complication.  All counts were correct at completion.  EBL: 200 cc    Brandon C. Randie Heinz, MD [facility] Office: [phone] Pager:  [phone]

## 2020-07-02 NOTE — Anesthesia Postprocedure Evaluation (Signed)
Anesthesia Post Note  Patient: [name] [name]  Procedure(s) Performed: INFERIOR VENA CAVA FILTER REMOVAL (N/A Abdomen) LEFT LOWER EXTREMITY VENOGRAM WITH STENT PLACEMENT (Left Groin)     Patient location during evaluation: PACU Anesthesia Type: General Level of consciousness: awake and alert Pain management: pain level controlled Vital Signs Assessment: post-procedure vital signs reviewed and stable Respiratory status: spontaneous breathing, nonlabored ventilation, respiratory function stable and patient connected to nasal cannula oxygen Cardiovascular status: blood pressure returned to baseline and stable Postop Assessment: no apparent nausea or vomiting Anesthetic complications: no   No complications documented.  Last Vitals:  Vitals:   07/02/20 1530 07/02/20 1545  BP: 117/75 132/72  Pulse: 64 65  Resp: 12   Temp: (!) 36.1 C 36.8 C  SpO2: 96% 97%    Last Pain:  Vitals:   07/02/20 1600  TempSrc:   PainSc: Asleep                 Cecile Hearing

## 2020-07-02 NOTE — Progress Notes (Signed)
Pt received to room, conversant and appropriate.  Recent dilaudid induced drowsiness appropriate.  Incisions sights inspect, soft, clean and dry.  Monitors applied, VS taken, CCMD notified.  CHG wipes and assessment performed.  Will cont plan of care

## 2020-07-02 NOTE — Anesthesia Procedure Notes (Signed)
Procedure Name: Intubation Date/Time: 07/02/2020 10:32 AM Performed by: Macie Burows, CRNA Pre-anesthesia Checklist: Patient identified, Emergency Drugs available, Suction available and Patient being monitored Patient Re-evaluated:Patient Re-evaluated prior to induction Oxygen Delivery Method: Circle system utilized Preoxygenation: Pre-oxygenation with 100% oxygen Induction Type: IV induction Ventilation: Mask ventilation without difficulty Laryngoscope Size: Miller and 1 Grade View: Grade I Tube type: Oral Tube size: 7.5 mm Number of attempts: 1 Airway Equipment and Method: Stylet and Oral airway Placement Confirmation: ETT inserted through vocal cords under direct vision,  positive ETCO2 and breath sounds checked- equal and bilateral Tube secured with: Tape Dental Injury: Teeth and Oropharynx as per pre-operative assessment  Comments: Performed by De Blanch srna

## 2020-07-02 NOTE — Progress Notes (Signed)
ANTICOAGULATION CONSULT NOTE  Pharmacy Consult for heparin Indication: VTE treatment  Heparin Dosing Weight: 101.2 kg  Labs: Recent Labs    07/02/20 0955 07/02/20 1425  HGB 13.6 14.9  HCT 40.0 43.2  PLT  --  216  CREATININE 0.70 0.91    Assessment: 40 yom with hx PE and IVC filter placement in ~2009 on apixaban PTA for hx of factor V leiden, presenting with persistent swelling of left leg. Now s/p procedure with Vascular Surgery to remove IVC filter and treat left common femoral vein. Apixaban to be held x 48hrs (last dose 3/15 PM PTA). Pharmacy consulted to dose heparin post-op to start at 1800 tonight. CBC wnl. No active bleed issues reported.  Goal of Therapy:  Heparin level 0.3-0.7 units/ml aPTT 66-102 seconds Monitor platelets by anticoagulation protocol: Yes   Plan:  No bolus with recent apixaban/surgery. Start heparin at 1750 units/hr at 1800 per MD Check 6hr aPTT/heparin level Monitor daily heparin level and aPTT until correlating, CBC, s/sx bleeding F/u resume apixaban as appropriate   Leia Alf, PharmD, BCPS Clinical Pharmacist 07/02/2020 4:04 PM

## 2020-07-02 NOTE — Anesthesia Procedure Notes (Signed)
Arterial Line Insertion Start/End3/17/2022 10:10 AM, 07/02/2020 10:15 AM Performed by: Shelton Silvas, MD, Macie Burows, CRNA  Patient location: Pre-op. Preanesthetic checklist: patient identified, IV checked, site marked, risks and benefits discussed, surgical consent, monitors and equipment checked, pre-op evaluation, timeout performed and anesthesia consent Lidocaine 1% used for infiltration Right, radial was placed Catheter size: 18 G Hand hygiene performed  and maximum sterile barriers used   Attempts: 1 Procedure performed without using ultrasound guided technique. Following insertion, dressing applied and Biopatch. Post procedure assessment: normal and unchanged  Patient tolerated the procedure well with no immediate complications. Additional procedure comments: Performed by De Blanch srna .

## 2020-07-02 NOTE — Transfer of Care (Signed)
Immediate Anesthesia Transfer of Care Note  Patient: [name] [name]  Procedure(s) Performed: INFERIOR VENA CAVA FILTER REMOVAL (N/A Abdomen) LEFT LOWER EXTREMITY VENOGRAM WITH STENT PLACEMENT (Left Groin)  Patient Location: PACU  Anesthesia Type:General  Level of Consciousness: awake, drowsy and patient cooperative  Airway & Oxygen Therapy: Patient Spontanous Breathing and Patient connected to face mask  Post-op Assessment: Report given to RN and Post -op Vital signs reviewed and stable  Post vital signs: Reviewed and stable  Last Vitals:  Vitals Value Taken Time  BP 128/75 07/02/20 1411  Temp    Pulse 72 07/02/20 1415  Resp 15 07/02/20 1415  SpO2 97 % 07/02/20 1415  Vitals shown include unvalidated device data.  Last Pain:  Vitals:   07/02/20 0943  TempSrc:   PainSc: 0-No pain         Complications: No complications documented.

## 2020-07-02 NOTE — Interval H&P Note (Signed)
History and Physical Interval Note:  07/02/2020 10:00 AM  [name] [name]  has presented today for surgery, with the diagnosis of POST THROMBOTIC SYNDROME.  The various methods of treatment have been discussed with the patient and family. After consideration of risks, benefits and other options for treatment, the patient has consented to  Procedure(s): INFERIOR VENA CAVA FILTER REMOVAL (N/A) LEFT LOWER EXTREMITY VENOGRAM WITH POSSIBLE INTERVENTION (Left) as a surgical intervention.  The patient's history has been reviewed, patient examined, no change in status, stable for surgery.  I have reviewed the patient's chart and labs.  Questions were answered to the patient's satisfaction.     Lemar Livings

## 2020-07-03 DIAGNOSIS — D6851 Activated protein C resistance: Secondary | ICD-10-CM | POA: Diagnosis not present

## 2020-07-03 LAB — APTT: aPTT: 63 seconds — ABNORMAL HIGH (ref 24–36)

## 2020-07-03 LAB — CBC
HCT: 38.3 % — ABNORMAL LOW (ref 39.0–52.0)
Hemoglobin: 13.5 g/dL (ref 13.0–17.0)
MCH: 32.4 pg (ref 26.0–34.0)
MCHC: 35.2 g/dL (ref 30.0–36.0)
MCV: 91.8 fL (ref 80.0–100.0)
Platelets: 219 10*3/uL (ref 150–400)
RBC: 4.17 MIL/uL — ABNORMAL LOW (ref 4.22–5.81)
RDW: 13.4 % (ref 11.5–15.5)
WBC: 13.9 10*3/uL — ABNORMAL HIGH (ref 4.0–10.5)
nRBC: 0 % (ref 0.0–0.2)

## 2020-07-03 LAB — HEPARIN LEVEL (UNFRACTIONATED): Heparin Unfractionated: 0.33 IU/mL (ref 0.30–0.70)

## 2020-07-03 MED ORDER — OXYCODONE-ACETAMINOPHEN 5-325 MG PO TABS: 1.0000 | ORAL_TABLET | ORAL | 0 refills | Status: DC | PRN

## 2020-07-03 MED ORDER — CHLORHEXIDINE GLUCONATE CLOTH 2 % EX PADS: 6.0000 | MEDICATED_PAD | Freq: Every day | CUTANEOUS | Status: DC

## 2020-07-03 MED ORDER — APIXABAN 5 MG PO TABS
5.0000 mg | ORAL_TABLET | Freq: Two times a day (BID) | ORAL | Status: DC
  Administered 2020-07-03: 5 mg via ORAL
  Filled 2020-07-03: qty 1

## 2020-07-03 NOTE — Progress Notes (Signed)
Discharge instructions (including medications) discussed with and copy provided to patient/caregiver 

## 2020-07-03 NOTE — Discharge Summary (Signed)
Discharge Summary  Patient ID: [name] [name] [id number] 40 y.o. 05/03/1980  Admit date: 07/02/2020  Discharge date and time: 07/03/2020  Admitting Physician: Maeola Harman, MD   Discharge Physician: Lemar Livings, MD  Admission Diagnoses: Dvt femoral (deep venous thrombosis) [facility]) [I82.419]  Discharge Diagnoses: DVT Femoral  May Thurner syndrome  Admission Condition: fair  Discharged Condition: good  Indication for Admission: 40 year old male with history of bilateral lower extremity DVTs with a history of factor V Leiden.  His most recent DVT was on the left side.  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  He does have significant post thrombotic syndrome on the left and is indicated for filter removal with possible intervention of his common femoral vein as well as common iliac vein on the left.  Hospital Course: 40 year old male was admitted on 07/03/19 and underwent Left lower extremity and central venogram, Intravascular ultrasound of the left common femoral vein, left external and common iliac veins and IVC, mechanical thrombectomy of the inferior vena cava filter with cleaner wire, removal of IVC filter, balloon angioplasty of the left common femoral and left external iliac veins with 8,10, and 12 mm balloons and stent of the left common iliac vein with 19 x 90 Wallstent. He tolerated the procedure well and was taken to the recovery room instable condition  Patient did well overnight with no major issues. Lower extremities well perfused and warm. Mild access site pain in the left thigh and right neck. Sutures were removed from the right internal jugular access site and left femoral vein. No swelling, hematoma or bleeding. Patients vital signs remained stable.  He remained hemodynamically stable. Tolerating diet, voiding without difficulty and ambulated without any major issues. He remained stable for discharge post operative day 1. He will go home on his Eliquis as prescribed. PDMP was reviewed and pain medication was sent to the patients requested pharmacy. He has follow up arranged in 1 month with Iliac/ IVC duplex and LLE venous duplex.  Consults: None  Treatments: antibiotics: Ancef, anticoagulation: heparin and surgery:  Left lower extremity and central venogram, Intravascular ultrasound of the left common femoral vein, left external and common iliac veins and IVC, mechanical thrombectomy of the inferior vena cava filter with cleaner wire, removal of IVC filter, balloon angioplasty of the left common femoral and left external iliac veins with 8,10, and 12 mm balloons and stent of the left common iliac vein with 19 x 90 Wallstent.   Disposition: Discharge disposition: 01-Home or Self Care      Patient Instructions:  Allergies as of 07/03/2020   No Known Allergies     Medication List    TAKE these medications   acetaminophen 325 MG tablet Commonly known as: TYLENOL Take 2 tablets (650 mg total) by mouth every 6 (six) hours as needed for mild pain (or temp > 100).   apixaban 5 MG Tabs tablet Commonly known as: ELIQUIS Take 1 tablet (5 mg total) by mouth 2 (two) times daily.   oxyCODONE-acetaminophen 5-325 MG tablet Commonly known as: PERCOCET/ROXICET Take 1-2 tablets by mouth every 4 (four) hours as needed for moderate pain.      Activity: activity as tolerated and no driving while on analgesics Diet: regular diet Wound Care: keep wound clean and dry. May  shower and clean areas with mild soap and water, pat dry  Follow-up with Dr. Randie Heinz in 4 weeks.  SignedGraceann Congress, PA-C 07/03/2020 11:12 AM [facility] Office: [phone]

## 2020-07-03 NOTE — Discharge Instructions (Signed)
  [facility]   Discharge Instructions  Please refer to the following instructions for your post-procedure care. Your surgeon or Physician Assistant will discuss any changes with you.  Activity  You are encouraged to walk as much as you can. You can slowly return to normal activities but must avoid strenuous activity and heavy lifting until your doctor tells you it's OK. Avoid activities such as vacuuming or swinging a gold club. It is normal to feel tired for several weeks after your surgery. Do not drive until your doctor gives the OK and you are no longer taking prescription pain medications. It is also normal to have difficulty with sleep habits, eating, and bowel movements after surgery. These will go away with time.  Bathing/Showering  Shower daily after you go home.  Do not soak in a bathtub, hot tub, or swim until the incision heals completely. Only use soap and water on your incisions and then protect and keep dry.  Incision Care  Shower every day. Clean your incision with mild soap and water. Pat the area dry with a clean towel. You do not need a bandage unless otherwise instructed. Do not apply any ointments or creams to your incision. If you clothing is irritating, you may cover your incision with a dry gauze pad.  Diet  Resume your normal diet. There are no special food restrictions following this procedure. A low fat/low cholesterol diet is recommended for all patients with vascular disease. In order to heal from your surgery, it is CRITICAL to get adequate nutrition. Your body requires vitamins, minerals, and protein. Vegetables are the best source of vitamins and minerals. Vegetables also provide the perfect balance of protein. Processed food has little nutritional value, so try to avoid this.  Medications  Resume taking all of your medications unless your doctor or nurse practitioner tells you not to. If your incision is causing pain, you may  take over-the-counter pain relievers such as acetaminophen (Tylenol). If you were prescribed a stronger pain medication, please be aware these medications can cause nausea and constipation. Prevent nausea by taking the medication with a snack or meal. Avoid constipation by drinking plenty of fluids and eating foods with a high amount of fiber, such as fruits, vegetables, and grains.   Do not take Tylenol if you are taking prescription pain medications.   Follow up  Our office will schedule a follow-up appointment with a Ultrasound in 4 weeks  Please call us immediately for any of the following conditions  . Severe or worsening pain in your legs or feet or in your abdomen back or chest. . Increased pain, redness, drainage (pus) from your incision site. . Increased abdominal pain, bloating, nausea, vomiting or persistent diarrhea. . Fever of 101 degrees or higher. . Swelling in your leg (s), neck, upper extremities   If you have questions, please call the office at [phone].

## 2020-07-03 NOTE — Progress Notes (Addendum)
Progress Note    07/03/2020 7:33 AM 1 Day Post-Op  Subjective: just having right neck soreness otherwise no complaints. He is eager to go home    Vitals:   07/03/20 0300 07/03/20 0310  BP:  116/70  Pulse:  66  Resp: 12 16  Temp:  98 F (36.7 C)  SpO2:  95%   Physical Exam: Cardiac: regular Lungs: non labored Incisions: right IJ access site and left femoral vein access site clean, dry and intact. No swelling or hematoma. Expected tenderness present. Sutures removed. Clean dressings applied Extremities: moving all extremities without deficits. 2+ femoral pulse, 2+ DP pulses bilaterally, feet warm and well perfused. Chronic venous changes to bilateral lower extremities Neurologic: alert and oriented  CBC    Component Value Date/Time   WBC 13.9 (H) 07/03/2020 0146   RBC 4.17 (L) 07/03/2020 0146   HGB 13.5 07/03/2020 0146   HGB 15.1 01/30/2020 1055   HCT 38.3 (L) 07/03/2020 0146   HCT 43.6 01/30/2020 1055   PLT 219 07/03/2020 0146   PLT 234 01/30/2020 1055   MCV 91.8 07/03/2020 0146   MCV 92 01/30/2020 1055   MCH 32.4 07/03/2020 0146   MCHC 35.2 07/03/2020 0146   RDW 13.4 07/03/2020 0146   RDW 13.1 01/30/2020 1055   LYMPHSABS 1.3 01/01/2020 1055   MONOABS 1.6 (H) 01/01/2020 1055   EOSABS 0.3 01/01/2020 1055   BASOSABS 0.1 01/01/2020 1055    BMET    Component Value Date/Time   NA 136 07/02/2020 1425   K 4.6 07/02/2020 1425   CL 107 07/02/2020 1425   CO2 22 07/02/2020 1425   GLUCOSE 127 (H) 07/02/2020 1425   BUN 9 07/02/2020 1425   CREATININE 0.91 07/02/2020 1425   CALCIUM 8.9 07/02/2020 1425   GFRNONAA >60 07/02/2020 1425   GFRAA >60 12/31/2019 0156    INR    Component Value Date/Time   INR 1.00 04/26/2018 0236     Intake/Output Summary (Last 24 hours) at 07/03/2020 0733 Last data filed at 07/03/2020 0600 Gross per 24 hour  Intake 2457.63 ml  Output 2655 ml  Net -197.37 ml     Assessment/Plan:  40 y.o. male is s/p 1.  Ultrasound-guided  cannulation right internal jugular vein and left femoral vein mid thigh 2.  Left lower extremity and central venogram 3.  Intravascular ultrasound left common femoral vein, left external and common iliac veins and IVC 4.  Mechanical thrombectomy of inferior vena cava filter with cleaner wire 5.  Removal of IVC filter 6.  Balloon angioplasty of left common femoral and left external iliac veins with 8, 10 and 12 mm balloons 7.  Stent of left common iliac vein with 18 x 90 mm Wallstent  1 Day Post-Op. Doing well post op. VSS. Hemodynamically stable. Is having right neck pain at access site. Will need to transition back to Eliquis today before D/C. Ambulated this morning. He is otherwise stable for discharge home later today. He will have follow up in 1 month with IVC/ iliac and lower extremity venous duplex  DVT prophylaxis:  Heparin IV   Graceann Congress, PA-C [facility] [phone] 07/03/2020 7:33 AM    I have interviewed and examined patient with PA and agree with assessment and plan above.  Ulcers removed no hematoma.  Fit for compression today restart Eliquis and discharged home.  Okay to return to work next week.  He will follow-up in 4 to 6 weeks with IVC iliac duplex.  Eliquis  indefinitely.  Noa Galvao C. Randie Heinz, MD [facility] [place] Office: [phone] Pager: [phone]

## 2020-07-03 NOTE — Progress Notes (Signed)
ANTICOAGULATION CONSULT NOTE  Pharmacy Consult for heparin Indication: VTE treatment  Heparin Dosing Weight: 101.2 kg  Labs: Recent Labs    07/02/20 0955 07/02/20 1425 07/03/20 0145 07/03/20 0146  HGB 13.6 14.9  --  13.5  HCT 40.0 43.2  --  38.3*  PLT  --  216  --  219  APTT  --   --  63*  --   HEPARINUNFRC  --   --  0.33  --   CREATININE 0.70 0.91  --   --     Assessment: 40 yom with hx PE and IVC filter placement in ~2009 on apixaban PTA for hx of factor V leiden, presenting with persistent swelling of left leg. Now s/p procedure with Vascular Surgery to remove IVC filter and treat left common femoral vein. Apixaban to be held x 48hrs (last dose 3/15 PM PTA). Pharmacy consulted to dose heparin post-op 3/17. CBC wnl. No active bleed issues reported.  Pharmacy asked to transition back to home apixaban.  Goal of Therapy:  Heparin level 0.3-0.7 units/ml aPTT 66-102 seconds Monitor platelets by anticoagulation protocol: Yes   Plan:  Stop heparin Resume apixaban 5mg  BID Pharmacy will sign off, reconsult as needed   , PharmD, BCPS, [facility] Clinical Pharmacist [phone] Please check AMION for all [facility] Pharmacy numbers 07/03/2020

## 2020-07-03 NOTE — Plan of Care (Signed)
  Problem: Education: Goal: Knowledge of General Education information will improve Description: Including pain rating scale, medication(s)/side effects and non-pharmacologic comfort measures Outcome: Adequate for Discharge   

## 2020-07-03 NOTE — Progress Notes (Signed)
ANTICOAGULATION CONSULT NOTE  Pharmacy Consult for heparin Indication: VTE treatment  Heparin Dosing Weight: 101.2 kg  Labs: Recent Labs    07/02/20 0955 07/02/20 1425 07/03/20 0145 07/03/20 0146  HGB 13.6 14.9  --  13.5  HCT 40.0 43.2  --  38.3*  PLT  --  216  --  219  APTT  --   --  63*  --   HEPARINUNFRC  --   --  0.33  --   CREATININE 0.70 0.91  --   --     Assessment: 40 yom with hx PE and IVC filter placement in ~2009 on apixaban PTA for hx of factor V leiden, presenting with persistent swelling of left leg. Now s/p procedure with Vascular Surgery to remove IVC filter and treat left common femoral vein. Apixaban to be held x 48hrs (last dose 3/15 PM PTA). Pharmacy consulted to dose heparin post-op 3/17. CBC wnl. No active bleed issues reported.  Heparin level 0.33 (affected by apixaban), PTT 63 sec (slightly subtherapeutic) on gtt at 1750 units/hr. No issues with line or bleeding reported per RN.  Goal of Therapy:  Heparin level 0.3-0.7 units/ml aPTT 66-102 seconds Monitor platelets by anticoagulation protocol: Yes   Plan:  Increase heparin to 1900 units/hr  Will f/u PTT and heparin level in 6 hours  Christoper Fabian, PharmD, BCPS Please see amion for complete clinical pharmacist phone list 07/03/2020 4:23 AM

## 2020-07-06 LAB — BPAM RBC
Blood Product Expiration Date: 202204102359
Blood Product Expiration Date: 202204102359
Unit Type and Rh: 7300
Unit Type and Rh: 7300

## 2020-07-06 LAB — TYPE AND SCREEN
ABO/RH(D): B POS
Antibody Screen: NEGATIVE
Unit division: 0
Unit division: 0

## 2020-07-07 ENCOUNTER — Encounter (HOSPITAL_COMMUNITY): Payer: Self-pay | Admitting: Vascular Surgery

## 2020-07-21 ENCOUNTER — Other Ambulatory Visit: Payer: Self-pay

## 2020-07-21 DIAGNOSIS — I82402 Acute embolism and thrombosis of unspecified deep veins of left lower extremity: Secondary | ICD-10-CM

## 2020-08-07 ENCOUNTER — Ambulatory Visit (HOSPITAL_COMMUNITY): Payer: 59 | Attending: Vascular Surgery

## 2020-08-07 ENCOUNTER — Ambulatory Visit (HOSPITAL_COMMUNITY): Payer: 59

## 2020-08-12 ENCOUNTER — Encounter: Payer: Self-pay | Admitting: Vascular Surgery

## 2020-08-21 ENCOUNTER — Emergency Department (HOSPITAL_COMMUNITY): Payer: 59

## 2020-08-21 ENCOUNTER — Encounter (HOSPITAL_COMMUNITY): Payer: Self-pay | Admitting: Emergency Medicine

## 2020-08-21 ENCOUNTER — Other Ambulatory Visit: Payer: Self-pay

## 2020-08-21 ENCOUNTER — Emergency Department (HOSPITAL_BASED_OUTPATIENT_CLINIC_OR_DEPARTMENT_OTHER): Payer: 59

## 2020-08-21 ENCOUNTER — Emergency Department (HOSPITAL_COMMUNITY)
Admission: EM | Admit: 2020-08-21 | Discharge: 2020-08-21 | Disposition: A | Discharge status: Home / Self Care | Payer: 59 | Attending: Emergency Medicine | Admitting: Emergency Medicine

## 2020-08-21 DIAGNOSIS — Z86718 Personal history of other venous thrombosis and embolism: Secondary | ICD-10-CM | POA: Diagnosis not present

## 2020-08-21 DIAGNOSIS — L089 Local infection of the skin and subcutaneous tissue, unspecified: Secondary | ICD-10-CM | POA: Diagnosis not present

## 2020-08-21 DIAGNOSIS — R609 Edema, unspecified: Secondary | ICD-10-CM | POA: Diagnosis not present

## 2020-08-21 DIAGNOSIS — L538 Other specified erythematous conditions: Secondary | ICD-10-CM | POA: Diagnosis not present

## 2020-08-21 DIAGNOSIS — F17210 Nicotine dependence, cigarettes, uncomplicated: Secondary | ICD-10-CM | POA: Diagnosis not present

## 2020-08-21 DIAGNOSIS — M7989 Other specified soft tissue disorders: Secondary | ICD-10-CM | POA: Diagnosis present

## 2020-08-21 DIAGNOSIS — Z7901 Long term (current) use of anticoagulants: Secondary | ICD-10-CM | POA: Diagnosis not present

## 2020-08-21 LAB — CBC WITH DIFFERENTIAL/PLATELET
Abs Immature Granulocytes: 0.04 10*3/uL (ref 0.00–0.07)
Basophils Absolute: 0.1 10*3/uL (ref 0.0–0.1)
Basophils Relative: 1 %
Eosinophils Absolute: 0.5 10*3/uL (ref 0.0–0.5)
Eosinophils Relative: 6 %
HCT: 44.9 % (ref 39.0–52.0)
Hemoglobin: 15 g/dL (ref 13.0–17.0)
Immature Granulocytes: 1 %
Lymphocytes Relative: 21 %
Lymphs Abs: 1.8 10*3/uL (ref 0.7–4.0)
MCH: 32.2 pg (ref 26.0–34.0)
MCHC: 33.4 g/dL (ref 30.0–36.0)
MCV: 96.4 fL (ref 80.0–100.0)
Monocytes Absolute: 0.6 10*3/uL (ref 0.1–1.0)
Monocytes Relative: 8 %
Neutro Abs: 5.4 10*3/uL (ref 1.7–7.7)
Neutrophils Relative %: 63 %
Platelets: 241 10*3/uL (ref 150–400)
RBC: 4.66 MIL/uL (ref 4.22–5.81)
RDW: 13.2 % (ref 11.5–15.5)
WBC: 8.4 10*3/uL (ref 4.0–10.5)
nRBC: 0 % (ref 0.0–0.2)

## 2020-08-21 LAB — COMPREHENSIVE METABOLIC PANEL
ALT: 29 U/L (ref 0–44)
AST: 27 U/L (ref 15–41)
Albumin: 3.9 g/dL (ref 3.5–5.0)
Alkaline Phosphatase: 76 U/L (ref 38–126)
Anion gap: 7 (ref 5–15)
BUN: 10 mg/dL (ref 6–20)
CO2: 23 mmol/L (ref 22–32)
Calcium: 9.3 mg/dL (ref 8.9–10.3)
Chloride: 107 mmol/L (ref 98–111)
Creatinine, Ser: 0.86 mg/dL (ref 0.61–1.24)
GFR, Estimated: >60 mL/min (ref >60)
Glucose, Bld: 115 mg/dL — ABNORMAL HIGH (ref 70–99)
Potassium: 3.7 mmol/L (ref 3.5–5.1)
Sodium: 137 mmol/L (ref 135–145)
Total Bilirubin: 0.6 mg/dL (ref 0.3–1.2)
Total Protein: 6.9 g/dL (ref 6.5–8.1)

## 2020-08-21 LAB — LACTIC ACID, PLASMA: Lactic Acid, Venous: 1.9 mmol/L (ref 0.5–1.9)

## 2020-08-21 MED ORDER — DOXYCYCLINE HYCLATE 100 MG PO CAPS: 100.0000 mg | ORAL_CAPSULE | Freq: Two times a day (BID) | ORAL | 0 refills | Status: AC

## 2020-08-21 NOTE — ED Provider Notes (Signed)
[facility] EMERGENCY DEPARTMENT Provider Note   CSN: [id number] Arrival date & time: 08/21/20  1017     History No chief complaint on file.   [name] [name] is a 40 y.o. male.  HPI   Patient is a 40 year old male with a history of bilateral lower extremity DVTs, factor V Leiden, pulmonary embolism, anticoagulated on Eliquis who presents the emergency department due to atraumatic left foot pain and swelling.  Patient states that he woke up yesterday morning and felt a mild pain in the foot.  Over the past 24 hours he started experiencing worsening pain as well as edema diffusely along the foot that is climbing up the lower leg to the lower calf.  Confirms his listed medical history.  Denies any fevers, chills, nausea, vomiting, numbness.  Per records, #17 patient had removal of his IVC filter, balloon angioplasty of the left leg with stent placement. Pt states he has been compliant with his eliquis and denies missing any doses.     Past Medical History:  Diagnosis Date  . Anxiety and depression   . DVT (deep venous thrombosis) (HCC)    Pt unsure of date   . Factor V Leiden mutation (HCC)    With resultant hypercoaguable state, Hx of DVT, PE, SP IVC filter placement, on chronic coumadin and requires high doses to maintine IRN 2-3.  Marland Kitchen GERD (gastroesophageal reflux disease)    history of  . PE (pulmonary thromboembolism) (HCC)    unsure of date  . Pneumonia   . Pneumothorax 8/11   HX of, requiring chest tube/hospitalization.   . Venous stasis    bilateral LE.    Patient Active Problem List   Diagnosis Date Noted  . Dvt femoral (deep venous thrombosis) (HCC) 07/02/2020  . Quit smoking 03/17/2020  . Pulmonary embolism (HCC) 12/30/2019  . Factor V Leiden, prothrombin gene mutation (HCC) 04/25/2018  . Diverticulitis of colon with perforation 04/22/2018  . Long term current use of anticoagulant 05/09/2010  . Venous stasis dermatitis of both lower extremities  10/28/2008  . PRIMARY HYPERCOAGULABLE STATE 12/18/2007  . Acute thromboembolism of deep veins of lower extremity (HCC) 12/13/2007    Past Surgical History:  Procedure Laterality Date  . ANGIOPLASTY ILLIAC ARTERY Right 07/11/2016   Procedure: balloon ANGIOPLASTY of Inferior vena cava, right external iliac vein, right common femoral vein;  Surgeon: Maeola Harman, MD;  Location: [facility] OR;  Service: Vascular;  Laterality: Right;  . INTRAVASCULAR ULTRASOUND/IVUS Right 07/11/2016   Procedure: INTRAVASCULAR ULTRASOUND/IVUS of right popliteal vein, right common femoral vein, right femoral vein, right exterternal iliac vein, and right internal iliac vein;  Surgeon: Maeola Harman, MD;  Location: [facility] OR;  Service: Vascular;  Laterality: Right;  . LOWER EXTREMITY ANGIOGRAPHY N/A 07/12/2016   Procedure: LYSIS RECHECK;  Surgeon: Nada Libman, MD;  Location: [facility] INVASIVE CV LAB;  Service: Cardiovascular;  Laterality: N/A;  . LOWER EXTREMITY VENOGRAPHY N/A 04/13/2020   Procedure: LOWER EXTREMITY VENOGRAPHY - Left Leg;  Surgeon: Maeola Harman, MD;  Location: [facility] INVASIVE CV LAB;  Service: Cardiovascular;  Laterality: N/A;  . placement of IVC filter  8/09  . Placement of large-bore right chest tube  8/11  . right minithoracotomy with evacuation of hematoma  8/09  . TONSILLECTOMY     removed as a child  . ULTRASOUND GUIDANCE FOR VASCULAR ACCESS Right 07/11/2016   Procedure: ULTRASOUND GUIDANCE FOR VASCULAR ACCESS;  Surgeon: Maeola Harman, MD;  Location: [facility] OR;  Service: Vascular;  Laterality: Right;  . VENOGRAM Left 07/02/2020   Procedure: LEFT LOWER EXTREMITY VENOGRAM WITH STENT PLACEMENT;  Surgeon: Maeola Harman, MD;  Location: [facility] OR;  Service: Vascular;  Laterality: Left;       No family history on file.  Social History   Tobacco Use  . Smoking status: Current Every Day Smoker    Packs/day: 0.50    Years: 20.00    Pack years: 10.00    Types:  Cigarettes    Start date: 07/07/2016  . Smokeless tobacco: Former Clinical biochemist  . Vaping Use: Never used  Substance Use Topics  . Alcohol use: No  . Drug use: Yes    Frequency: 1.0 times per week    Types: Marijuana    Comment: last used 06/30/20    Home Medications Prior to Admission medications   Medication Sig Start Date End Date Taking? Authorizing Provider  doxycycline (VIBRAMYCIN) 100 MG capsule Take 1 capsule (100 mg total) by mouth 2 (two) times daily for 10 days. 08/21/20 08/31/20 Yes Placido Sou, PA-C  acetaminophen (TYLENOL) 325 MG tablet Take 2 tablets (650 mg total) by mouth every 6 (six) hours as needed for mild pain (or temp > 100). 04/26/18   Sherrie George, PA-C  apixaban (ELIQUIS) 5 MG TABS tablet Take 1 tablet (5 mg total) by mouth 2 (two) times daily. 04/03/20   Maeola Harman, MD  oxyCODONE-acetaminophen (PERCOCET/ROXICET) 5-325 MG tablet Take 1-2 tablets by mouth every 4 (four) hours as needed for moderate pain. 07/03/20   Baglia, Corrina, PA-C  enoxaparin (LOVENOX) 120 MG/0.8ML injection Inject 0.76 mLs (115 mg total) into the skin every 12 (twelve) hours. 04/26/18 10/11/19  Sherrie George, PA-C  warfarin (COUMADIN) 10 MG tablet Take 2 tablets (20 mg total) by mouth daily. 04/26/18 10/11/19  Sherrie George, PA-C    Allergies    Patient has no known allergies.  Review of Systems   Review of Systems  All other systems reviewed and are negative. Ten systems reviewed and are negative for acute change, except as noted in the HPI.    Physical Exam Updated Vital Signs BP (!) 147/88 (BP Location: Right Arm)   Pulse 70   Temp 98.2 F (36.8 C) (Oral)   Resp 18   SpO2 99%   Physical Exam Vitals and nursing note reviewed.  Constitutional:      General: He is not in acute distress.    Appearance: Normal appearance. He is not ill-appearing, toxic-appearing or diaphoretic.  HENT:     Head: Normocephalic and atraumatic.     Right Ear: External ear  normal.     Left Ear: External ear normal.     Nose: Nose normal.     Mouth/Throat:     Mouth: Mucous membranes are moist.     Pharynx: Oropharynx is clear. No oropharyngeal exudate or posterior oropharyngeal erythema.  Eyes:     Extraocular Movements: Extraocular movements intact.  Cardiovascular:     Rate and Rhythm: Normal rate and regular rhythm.     Pulses: Normal pulses.     Heart sounds: Normal heart sounds. No murmur heard. No friction rub. No gallop.   Pulmonary:     Effort: Pulmonary effort is normal. No respiratory distress.     Breath sounds: Normal breath sounds. No stridor. No wheezing, rhonchi or rales.  Abdominal:     General: Abdomen is flat.     Palpations: Abdomen is soft.     Tenderness:  There is no abdominal tenderness.  Musculoskeletal:        General: Normal range of motion.     Cervical back: Normal range of motion and neck supple. No tenderness.     Right lower leg: No edema.     Left lower leg: Edema present.     Comments: 2+ pitting edema noted diffusely in the left foot and left lower leg to the lower calf.  Dry flaking skin in the bilateral lower extremities consistent with stasis dermatitis.  2+ DP pulses noted bilaterally.  Left foot is warm to the touch.  There is a point of significant pain with some developing erythema between the left third and fourth MTP joints.  There appears to be a small wound along the webbing of the toes as well as a small amount of purulent discharge.  Skin:    General: Skin is warm and dry.  Neurological:     General: No focal deficit present.     Mental Status: He is alert and oriented to person, place, and time.  Psychiatric:        Mood and Affect: Mood normal.        Behavior: Behavior normal.    ED Results / Procedures / Treatments   Labs (all labs ordered are listed, but only abnormal results are displayed) Labs Reviewed  COMPREHENSIVE METABOLIC PANEL - Abnormal; Notable for the following components:       Result Value   Glucose, Bld 115 (*)    All other components within normal limits  CBC WITH DIFFERENTIAL/PLATELET  LACTIC ACID, PLASMA  LACTIC ACID, PLASMA    EKG None  Radiology DG Foot Complete Left  Result Date: 08/21/2020 CLINICAL DATA:  Pain and swelling EXAM: LEFT FOOT - COMPLETE 3+ VIEW COMPARISON:  None. FINDINGS: Frontal, oblique, and lateral views were obtained. There is soft tissue swelling dorsally. There is no fracture or dislocation. There is narrowing of the first MTP joint. Other joint spaces appear unremarkable. No erosive change. There is a small inferior calcaneal spur. IMPRESSION: Soft tissue swelling. No fracture or dislocation. Narrowing first MTP joint. Other joint spaces appear normal. There is a small inferior calcaneal spur. Electronically Signed   By: Bretta Bang III M.D.   On: 08/21/2020 12:20   VAS [place] LOWER EXTREMITY VENOUS (DVT) ([facility] and [facility] 7a-7p)  Result Date: 08/21/2020  Lower Venous DVT Study Patient Name:  [name] [name]  Date of Exam:   08/21/2020 Medical Rec #: [id number]        Accession #:    [id number] Date of Birth: 04/14/1981        Patient Gender: M Patient Age:   40Y Exam Location:  [facility] Procedure:      VAS [place] LOWER EXTREMITY VENOUS (DVT) Referring Phys: [id number] Placido Sou --------------------------------------------------------------------------------  Indications: Edema, Erythema, and History of DVT.  Comparison Study: 12/28/2019- Performing Technologist: Gertie Fey MHA, RDMS, RVT, RDCS  Examination Guidelines: A complete evaluation includes B-mode imaging, spectral Doppler, color Doppler, and power Doppler as needed of all accessible portions of each vessel. Bilateral testing is considered an integral part of a complete examination. Limited examinations for reoccurring indications may be performed as noted. The reflux portion of the exam is performed with the patient in reverse Trendelenburg.   +-----+---------------+---------+-----------+----------+--------------+ RIGHTCompressibilityPhasicitySpontaneityPropertiesThrombus Aging +-----+---------------+---------+-----------+----------+--------------+ CFV  Full           Yes      Yes                                 +-----+---------------+---------+-----------+----------+--------------+   +---------+---------------+---------+-----------+----------+-----------------+  LEFT     CompressibilityPhasicitySpontaneityPropertiesThrombus Aging    +---------+---------------+---------+-----------+----------+-----------------+ CFV      Partial        Yes      Yes                  Age Indeterminate +---------+---------------+---------+-----------+----------+-----------------+ SFJ      Partial                                      Age Indeterminate +---------+---------------+---------+-----------+----------+-----------------+ FV Prox  Partial                                      Age Indeterminate +---------+---------------+---------+-----------+----------+-----------------+ FV Mid   Partial                                      Age Indeterminate +---------+---------------+---------+-----------+----------+-----------------+ FV DistalNone                                         Age Indeterminate +---------+---------------+---------+-----------+----------+-----------------+ PFV      Partial                                      Age Indeterminate +---------+---------------+---------+-----------+----------+-----------------+ POP      Partial                 No                   Age Indeterminate +---------+---------------+---------+-----------+----------+-----------------+ PTV      Full                    Yes                                    +---------+---------------+---------+-----------+----------+-----------------+ PERO     Full                    Yes                                     +---------+---------------+---------+-----------+----------+-----------------+     Summary: RIGHT: - No evidence of common femoral vein obstruction.  LEFT: - Findings consistent with age indeterminate deep vein thrombosis involving the left common femoral vein, SF junction, left femoral vein, left proximal profunda vein, and left popliteal vein. Clot has improved some since previous study. - No cystic structure found in the popliteal fossa.  *See table(s) above for measurements and observations.    Preliminary    Procedures Procedures   Medications Ordered in ED Medications - No data to display  ED Course  I have reviewed the triage vital signs and the nursing notes.  Pertinent labs & imaging results that were available during my care of the patient were reviewed by me and considered in my medical decision making (see chart for details).    MDM Rules/Calculators/A&P  Pt is a 40 y.o. male who presents the emergency department with what appears to be a developing infection in the left foot.  Labs: CBC without abnormalities. CMP with a glucose of 115. Lactic acid 1.9.  Imaging: X-ray of the left foot shows soft tissue swelling but no fracture or dislocation.  There is narrowing of the first MTP joint.   DVT ultrasound of the left lower extremity shows findings consistent with an age-indeterminate DVT involving the left common femoral vein, SF junction, left femoral vein, left proximal profunda vein, and left popliteal vein.  They note that the clot has improved some since his previous study.  I, Placido SouLogan Ivi Griffith, PA-C, personally reviewed and evaluated these images and lab results as part of my medical decision-making.  Patient discussed with vascular tech.  They do note that the DVT in his left leg is age-indeterminate but feel that this is likely chronic.  It is actually improved some since his previous DVT study.  He states he has been taking his Eliquis as  prescribed and denies missing any doses. No CP or SOB.   Lab work today is reassuring.  No leukocytosis on CBC.  No elevation in lactic acid.  He is afebrile.  Denies any systemic symptoms.  Doubt sepsis.  Think the patient is likely developing an infection in the left foot.  There is a very small area of erythema as well as purulence between the third and fourth MTPs on the left foot.  He states that he has been dealing with athlete's foot and has also been scratching his feet more than normal recently.  Will discharge patient on a course of doxycycline.  Discussed return precautions in length.  Feel that he is stable for discharge and he is agreeable.  His questions were answered and he was amicable at the time of discharge.  Note: Portions of this report may have been transcribed using voice recognition software. Every effort was made to ensure accuracy; however, inadvertent computerized transcription errors may be present.    Final Clinical Impression(s) / ED Diagnoses Final diagnoses:  Left foot infection   Rx / DC Orders ED Discharge Orders         Ordered    doxycycline (VIBRAMYCIN) 100 MG capsule  2 times daily        08/21/20 1425           Placido SouJoldersma, Bayyinah Dukeman, PA-C 08/21/20 1428    Benjiman CorePickering, Nathan, MD 08/21/20 1606

## 2020-08-21 NOTE — ED Notes (Signed)
Dc instructions reviewed with pt. Pt verbalized instructions.  Pt Dc.

## 2020-08-21 NOTE — Progress Notes (Signed)
Left lower extremity venous duplex completed. Refer to "CV Proc" under chart review to view preliminary results.  08/21/2020 1:58 PM Eula Fried., MHA, RVT, RDCS, RDMS

## 2020-08-21 NOTE — ED Triage Notes (Signed)
Pt here with c/o left foot swelling and a small area of redness , no trauma noted

## 2020-08-21 NOTE — Discharge Instructions (Signed)
I prescribed you an antibiotic called doxycycline.  Please take this twice a day for the next 10 days.  Do not stop taking this medication early even if you feel that your symptoms have resolved.  Please follow-up with your regular doctor regarding the infection in your left foot.  If your symptoms worsen, please come back to the emergency department so that you can be reevaluated.  It was a pleasure to meet you.

## 2020-10-20 ENCOUNTER — Encounter: Payer: Self-pay | Admitting: *Deleted

## 2021-02-27 ENCOUNTER — Other Ambulatory Visit: Payer: Self-pay | Admitting: Internal Medicine

## 2021-03-03 NOTE — Telephone Encounter (Signed)
Patient contact via telephone for an appointment.  Scheduled for tomorrow 03/04/21 at 8:45 am with Dr. Allena Katz.

## 2021-03-04 ENCOUNTER — Ambulatory Visit (INDEPENDENT_AMBULATORY_CARE_PROVIDER_SITE_OTHER): Payer: 59 | Admitting: Internal Medicine

## 2021-03-04 ENCOUNTER — Encounter: Payer: Self-pay | Admitting: Internal Medicine

## 2021-03-04 ENCOUNTER — Other Ambulatory Visit: Payer: Self-pay

## 2021-03-04 VITALS — BP 120/68 | HR 65 | Temp 97.8°F | Resp 24 | Ht 72.0 in | Wt 230.1 lb

## 2021-03-04 DIAGNOSIS — I82512 Chronic embolism and thrombosis of left femoral vein: Secondary | ICD-10-CM

## 2021-03-04 DIAGNOSIS — Z87891 Personal history of nicotine dependence: Secondary | ICD-10-CM

## 2021-03-04 DIAGNOSIS — D6851 Activated protein C resistance: Secondary | ICD-10-CM | POA: Diagnosis not present

## 2021-03-04 DIAGNOSIS — I82402 Acute embolism and thrombosis of unspecified deep veins of left lower extremity: Secondary | ICD-10-CM

## 2021-03-04 DIAGNOSIS — Z1211 Encounter for screening for malignant neoplasm of colon: Secondary | ICD-10-CM | POA: Insufficient documentation

## 2021-03-04 DIAGNOSIS — I2699 Other pulmonary embolism without acute cor pulmonale: Secondary | ICD-10-CM

## 2021-03-04 DIAGNOSIS — Z0000 Encounter for general adult medical examination without abnormal findings: Secondary | ICD-10-CM | POA: Diagnosis not present

## 2021-03-04 MED ORDER — APIXABAN 5 MG PO TABS: 5.0000 mg | ORAL_TABLET | Freq: Two times a day (BID) | ORAL | 3 refills | Status: DC

## 2021-03-04 NOTE — Assessment & Plan Note (Signed)
Flu vaccine refused  Pneumococcal vaccine refused Tetanus shot declined

## 2021-03-04 NOTE — Assessment & Plan Note (Deleted)
S/p balloon angioplasty of L common femoral and left external veins, and stenting of L common iliac veins on 3/17. Has not followed up with vascular surgery since then, advised pt to make a follow up appt. Pt denies any acute LE swelling or tenderness. States "this is the best he has felt in a long time".  He is wearing compression socks; reports excellent compliance. He denies fevers, chills, chest pain, SHOB, tachypnea. On exam, the left leg larger than the right leg, which is chronic in nature. Advised patient to continue current management with compression stockings, leg elevation, and eliquis 5mg  BID. He missed 2 days of Eliquis. Counseled him to start medication today, and to call the office if he notices any SHOB, leg swelling, or tenderness.   Plan: -continue compression stockings and leg elevation -continue eliquis 5mg  BID -f/u with vascular surgery (Dr )  -f/u with [facility] in 3 month

## 2021-03-04 NOTE — Progress Notes (Signed)
CC: Eliquis prescription refill   HPI:  [name] is a 40 y.o. male with a PMHx stated below and presents today for stated above. Please see the Encounters tab for problem-based Assessment & Plan for additional details.   Past Medical History:  Diagnosis Date   Anxiety and depression    DVT (deep venous thrombosis) (HCC)    Pt unsure of date    Factor V Leiden mutation (HCC)    With resultant hypercoaguable state, Hx of DVT, PE, SP IVC filter placement, on chronic coumadin and requires high doses to maintine IRN 2-3.   GERD (gastroesophageal reflux disease)    history of   PE (pulmonary thromboembolism) (HCC)    unsure of date   Pneumonia    Pneumothorax 8/11   HX of, requiring chest tube/hospitalization.    Venous stasis    bilateral LE.    Current Outpatient Medications on File Prior to Visit  Medication Sig Dispense Refill   acetaminophen (TYLENOL) 325 MG tablet Take 2 tablets (650 mg total) by mouth every 6 (six) hours as needed for mild pain (or temp > 100).     ELIQUIS 5 MG TABS tablet Take 1 tablet by mouth twice daily 60 tablet 0   oxyCODONE-acetaminophen (PERCOCET/ROXICET) 5-325 MG tablet Take 1-2 tablets by mouth every 4 (four) hours as needed for moderate pain. 10 tablet 0   [DISCONTINUED] enoxaparin (LOVENOX) 120 MG/0.8ML injection Inject 0.76 mLs (115 mg total) into the skin every 12 (twelve) hours. 30 Syringe 0   [DISCONTINUED] warfarin (COUMADIN) 10 MG tablet Take 2 tablets (20 mg total) by mouth daily. 60 tablet 0   No current facility-administered medications on file prior to visit.    No family history on file.  Social History   Socioeconomic History   Marital status: Single    Spouse name: Not on file   Number of children: Not on file   Years of education: Not on file   Highest education level: Not on file  Occupational History   Not on file  Tobacco Use   Smoking status: Every Day    Packs/day: 0.50    Years: 20.00    Pack years:  10.00    Types: Cigarettes    Start date: 07/07/2016   Smokeless tobacco: Former   Tobacco comments:    1/2 pk per day   Vaping Use   Vaping Use: Never used  Substance and Sexual Activity   Alcohol use: No   Drug use: Yes    Frequency: 1.0 times per week    Types: Marijuana    Comment: last used 06/30/20   Sexual activity: Not on file  Other Topics Concern   Not on file  Social History Narrative   Currently works for [employer], Lives with his girlfriend and has one son.    Social Determinants of Health   Financial Resource Strain: Not on file  Food Insecurity: Not on file  Transportation Needs: Not on file  Physical Activity: Not on file  Stress: Not on file  Social Connections: Not on file  Intimate Partner Violence: Not on file    Review of Systems: ROS negative except for what is noted on the assessment and plan.  Vitals:   03/04/21 0858  BP: 120/68  Pulse: 65  Resp: (!) 24  Temp: 97.8 F (36.6 C)  TempSrc: Oral  SpO2: 97%  Weight: 230 lb 1.6 oz (104.4 kg)  Height: 6' (1.829 m)     Physical  Exam: Constitutional: alert, well-appearing, in NAD HENT: normocephalic, atraumatic, mucous membranes moist Eyes: conjunctiva non-erythematous, EOMI Cardiovascular: RRR, no m/r/g, non-edematous bilateral LE Pulmonary/Chest: normal work of breathing on RA, LCTAB Abdominal: soft, non-tender to palpation, non-distended MSK: normal bulk and tone. Left leg larger than right, chronic. No acute swelling or TTP of bilateral lower extremities. No tenderness with manipulation of bilateral LE. Neurological: A&O x 3, 5/5 strength in bilateral upper and lower extremities Skin: warm and dry    Assessment & Plan:   See Encounters Tab for problem based charting.  Patient seen with Dr. Marcelline Mates, MD  Internal Medicine Resident, PGY-1 [facility]

## 2021-03-04 NOTE — Patient Instructions (Addendum)
Thank you, [name] for allowing [place] to provide your care today!  Today we discussed:  Factor 5 Leiden: please continue taking apixaban 5 mg twice daily and do not miss any days.  Stopping smoking: Please consider smoking 7 cig a day instead of 10. We will discuss and ask if you are motivated again during the next visit to discuss medications for this.    Please schedule an appointment with you Vascular surgeon's office for a follow up after this surgery.   I will call you if any labs, tests, or imaging results require any further attention or action.   Medications ordered    Start the following medications: Meds ordered this encounter  Medications   apixaban (ELIQUIS) 5 MG TABS tablet    Sig: Take 1 tablet (5 mg total) by mouth 2 (two) times daily.    Dispense:  60 tablet    Refill:  3     Follow up in: 3 months    Should you have any questions or concerns please call the internal medicine clinic at [phone].     Carmel Sacramento, MD  Internal Medicine Resident, PGY-1 [facility]

## 2021-03-04 NOTE — Assessment & Plan Note (Addendum)
On Eliquis 5 mg bid. Last took his medication on Monday; has not taken in 2 days. Prescription refilled. Denies SHOB, CP, leg swelling or any pain. Advised to start taking Eliquis today, and to call office if he notices SHOB, CP, leg swelling or any pain. Counseled extensively on the importance of smoking cessation (see quit smoking part). Continue wearing compression stalkings.    -continue eliquis 5mg  BID -continue compression stockings and leg elevation -f/u with [facility] in 3 month

## 2021-03-04 NOTE — Assessment & Plan Note (Signed)
Reports that he relapsed and has started smoking again x ~6 mo. He is smoking 1/2 ppd. He is aware that smoking coupled with Factor 5 Leiden places him at increased risk of hypercoagulability. Extensive counseling done to provide aid for smoking cessation. He was offered nicotine patches, Chantix, and Wellbutrin. He states he will think about these options, and his willingness to quit smoking over the next several months and re-discuss this topic a f/u visit in 3 mo.   -Discuss smoking cessation during f/u visit -Re-introduce Chantix vs Wellbutrin during f/u visit

## 2021-03-04 NOTE — Assessment & Plan Note (Signed)
S/p IVC filter removal on 3/17 by Dr Randie Heinz. Has not followed up with vascular surgery since then, advised pt to make a follow up appt. Denies fevers, chills, chest pain, SHOB, tachypnea. Advised patient to continue current management with compression stockings, leg elevation, and eliquis 5mg  BID. He missed 2 days of Eliquis. Counseled him to start medication today, and to call the office if he notices any SHOB, leg swelling, or tenderness.   Plan: -continue eliquis 5mg  BID -continue compression stockings and leg elevation -f/u with vascular surgery (Dr )  -f/u with [facility] in 3 month

## 2021-03-04 NOTE — Assessment & Plan Note (Addendum)
S/p balloon angioplasty of L common femoral and left external veins, and stenting of L common iliac veins on 3/17. Has not followed up with vascular surgery since then, advised pt to make a follow up appt. Pt denies any acute LE swelling or tenderness. States "this is the best he has felt in a long time".  He is wearing compression stalkings; reports excellent compliance. He denies fevers, chills, chest pain, SHOB, tachypnea. On exam, the left leg larger than the right leg, which is chronic in nature. Advised patient to continue current management with compression stockings, leg elevation, and eliquis 5mg  BID. He missed 2 days of Eliquis. Counseled him to start medication today, and to call the office if he notices any SHOB, leg swelling, or tenderness.   Plan: -continue compression stockings and leg elevation -continue eliquis 5mg  BID -f/u with vascular surgery (Dr )  -f/u with [facility] in 3 month

## 2021-03-05 NOTE — Progress Notes (Signed)
Internal Medicine Clinic Attending  I saw and evaluated the patient.  I personally confirmed the key portions of the history and exam documented by Dr. Patel and I reviewed pertinent patient test results.  The assessment, diagnosis, and plan were formulated together and I agree with the documentation in the resident's note.  

## 2021-10-09 ENCOUNTER — Encounter: Payer: Self-pay | Admitting: *Deleted

## 2021-10-16 ENCOUNTER — Other Ambulatory Visit: Payer: Self-pay | Admitting: Internal Medicine

## 2021-10-16 DIAGNOSIS — D6851 Activated protein C resistance: Secondary | ICD-10-CM

## 2021-10-18 NOTE — Telephone Encounter (Signed)
Patient has not scheduled follow up appointment since 02/2021.

## 2021-10-20 NOTE — Telephone Encounter (Signed)
Per chart, pt has an appt 10/25/21.

## 2021-10-25 ENCOUNTER — Encounter: Payer: 59 | Admitting: Student

## 2022-02-04 ENCOUNTER — Other Ambulatory Visit: Payer: Self-pay | Admitting: Student

## 2022-02-04 DIAGNOSIS — D6851 Activated protein C resistance: Secondary | ICD-10-CM

## 2022-03-09 ENCOUNTER — Other Ambulatory Visit: Payer: Self-pay | Admitting: Internal Medicine

## 2022-03-09 DIAGNOSIS — D6851 Activated protein C resistance: Secondary | ICD-10-CM

## 2022-03-09 NOTE — Telephone Encounter (Signed)
Next appt scheduled 03/14/22 with dr Geraldo Pitter.

## 2022-03-11 ENCOUNTER — Emergency Department (HOSPITAL_BASED_OUTPATIENT_CLINIC_OR_DEPARTMENT_OTHER)
Admission: EM | Admit: 2022-03-11 | Discharge: 2022-03-11 | Disposition: A | Discharge status: Home / Self Care | Payer: Self-pay | Attending: Emergency Medicine | Admitting: Emergency Medicine

## 2022-03-11 ENCOUNTER — Other Ambulatory Visit: Payer: Self-pay

## 2022-03-11 ENCOUNTER — Emergency Department (HOSPITAL_BASED_OUTPATIENT_CLINIC_OR_DEPARTMENT_OTHER): Payer: Self-pay | Admitting: Radiology

## 2022-03-11 ENCOUNTER — Encounter (HOSPITAL_BASED_OUTPATIENT_CLINIC_OR_DEPARTMENT_OTHER): Payer: Self-pay

## 2022-03-11 ENCOUNTER — Encounter (HOSPITAL_BASED_OUTPATIENT_CLINIC_OR_DEPARTMENT_OTHER): Payer: Self-pay | Admitting: Emergency Medicine

## 2022-03-11 DIAGNOSIS — D72829 Elevated white blood cell count, unspecified: Secondary | ICD-10-CM | POA: Insufficient documentation

## 2022-03-11 DIAGNOSIS — Z23 Encounter for immunization: Secondary | ICD-10-CM | POA: Insufficient documentation

## 2022-03-11 DIAGNOSIS — M109 Gout, unspecified: Secondary | ICD-10-CM | POA: Insufficient documentation

## 2022-03-11 DIAGNOSIS — M79672 Pain in left foot: Secondary | ICD-10-CM

## 2022-03-11 DIAGNOSIS — Z7901 Long term (current) use of anticoagulants: Secondary | ICD-10-CM | POA: Insufficient documentation

## 2022-03-11 DIAGNOSIS — M7989 Other specified soft tissue disorders: Secondary | ICD-10-CM | POA: Insufficient documentation

## 2022-03-11 DIAGNOSIS — B353 Tinea pedis: Secondary | ICD-10-CM | POA: Insufficient documentation

## 2022-03-11 LAB — COMPREHENSIVE METABOLIC PANEL
ALT: 27 U/L (ref 0–44)
AST: 19 U/L (ref 15–41)
Albumin: 4.6 g/dL (ref 3.5–5.0)
Alkaline Phosphatase: 75 U/L (ref 38–126)
Anion gap: 12 (ref 5–15)
BUN: 15 mg/dL (ref 6–20)
CO2: 22 mmol/L (ref 22–32)
Calcium: 9.8 mg/dL (ref 8.9–10.3)
Chloride: 104 mmol/L (ref 98–111)
Creatinine, Ser: 0.85 mg/dL (ref 0.61–1.24)
GFR, Estimated: >60 mL/min (ref >60)
Glucose, Bld: 113 mg/dL — ABNORMAL HIGH (ref 70–99)
Potassium: 3.8 mmol/L (ref 3.5–5.1)
Sodium: 138 mmol/L (ref 135–145)
Total Bilirubin: 0.5 mg/dL (ref 0.3–1.2)
Total Protein: 7.5 g/dL (ref 6.5–8.1)

## 2022-03-11 LAB — CBC WITH DIFFERENTIAL/PLATELET
Abs Immature Granulocytes: 0.05 10*3/uL (ref 0.00–0.07)
Basophils Absolute: 0.1 10*3/uL (ref 0.0–0.1)
Basophils Relative: 1 %
Eosinophils Absolute: 0.7 10*3/uL — ABNORMAL HIGH (ref 0.0–0.5)
Eosinophils Relative: 6 %
HCT: 48.3 % (ref 39.0–52.0)
Hemoglobin: 17.1 g/dL — ABNORMAL HIGH (ref 13.0–17.0)
Immature Granulocytes: 1 %
Lymphocytes Relative: 21 %
Lymphs Abs: 2.3 10*3/uL (ref 0.7–4.0)
MCH: 32.5 pg (ref 26.0–34.0)
MCHC: 35.4 g/dL (ref 30.0–36.0)
MCV: 91.8 fL (ref 80.0–100.0)
Monocytes Absolute: 1.2 10*3/uL — ABNORMAL HIGH (ref 0.1–1.0)
Monocytes Relative: 11 %
Neutro Abs: 6.6 10*3/uL (ref 1.7–7.7)
Neutrophils Relative %: 60 %
Platelets: 224 10*3/uL (ref 150–400)
RBC: 5.26 MIL/uL (ref 4.22–5.81)
RDW: 13.2 % (ref 11.5–15.5)
WBC: 10.9 10*3/uL — ABNORMAL HIGH (ref 4.0–10.5)
nRBC: 0 % (ref 0.0–0.2)

## 2022-03-11 LAB — LACTIC ACID, PLASMA: Lactic Acid, Venous: 1.9 mmol/L (ref 0.5–1.9)

## 2022-03-11 MED ORDER — FENTANYL CITRATE PF 50 MCG/ML IJ SOSY
50.0000 ug | PREFILLED_SYRINGE | Freq: Once | INTRAMUSCULAR | Status: DC
  Filled 2022-03-11: qty 1

## 2022-03-11 MED ORDER — PREDNISONE 20 MG PO TABS: 40.0000 mg | ORAL_TABLET | Freq: Every day | ORAL | 0 refills | Status: AC

## 2022-03-11 MED ORDER — ACETAMINOPHEN 325 MG PO TABS
650.0000 mg | ORAL_TABLET | Freq: Once | ORAL | Status: AC
  Administered 2022-03-11: 650 mg via ORAL
  Filled 2022-03-11: qty 2

## 2022-03-11 MED ORDER — KETOROLAC TROMETHAMINE 15 MG/ML IJ SOLN
15.0000 mg | Freq: Once | INTRAMUSCULAR | Status: AC
  Administered 2022-03-11: 15 mg via INTRAVENOUS
  Filled 2022-03-11: qty 1

## 2022-03-11 NOTE — ED Provider Notes (Signed)
[facility] EMERGENCY DEPT Provider Note   CSN: [id number] Arrival date & time: 03/11/22  1505     History Fact V Leiden, PE on eliquis Chief Complaint  Patient presents with   Cellulitis    Left foot     [name] [name] is a 41 y.o. male.  41 y.o male with a PMH of Factor V, DVT, PE on eliquis presents to the ED with a chief complaint of left foot pain x 2 days. Patient reports a sharp stabbing sensation to his left foot which began 2 days ago exacerbated with any type of palpation along with movement.  His significant other at the bedside reports that he had some athletes feet, therefore she provided him with some cream however patient could not apply this due to increasing pain.  He does not report any chills, no fever, no diarrhea or nausea.  There is no alleviating factors.  He does not have any prior history of gout, no recent trauma, no other complaints.No prior hx of diabetes.   The history is provided by the patient.       Home Medications Prior to Admission medications   Medication Sig Start Date End Date Taking? Authorizing Provider  predniSONE (DELTASONE) 20 MG tablet Take 2 tablets (40 mg total) by mouth daily for 5 days. 03/11/22 03/16/22 Yes Horacio Werth, Leonie Douglas, PA-C  acetaminophen (TYLENOL) 325 MG tablet Take 2 tablets (650 mg total) by mouth every 6 (six) hours as needed for mild pain (or temp > 100). 04/26/18   Sherrie George, PA-C  ELIQUIS 5 MG TABS tablet Take 1 tablet by mouth twice daily 02/04/22   Adron Bene, MD  oxyCODONE-acetaminophen (PERCOCET/ROXICET) 5-325 MG tablet Take 1-2 tablets by mouth every 4 (four) hours as needed for moderate pain. 07/03/20   Baglia, Corrina, PA-C  enoxaparin (LOVENOX) 120 MG/0.8ML injection Inject 0.76 mLs (115 mg total) into the skin every 12 (twelve) hours. 04/26/18 10/11/19  Sherrie George, PA-C  warfarin (COUMADIN) 10 MG tablet Take 2 tablets (20 mg total) by mouth daily. 04/26/18 10/11/19  Sherrie George, PA-C       Allergies    Patient has no known allergies.    Review of Systems   Review of Systems  Constitutional:  Negative for chills and fever.  HENT:  Negative for sore throat.   Respiratory:  Negative for shortness of breath.   Cardiovascular:  Negative for chest pain.  Gastrointestinal:  Negative for abdominal pain, nausea and vomiting.  Genitourinary:  Negative for flank pain.  Musculoskeletal:  Positive for arthralgias. Negative for back pain.  Skin:  Positive for color change.  Neurological:  Negative for light-headedness and headaches.  All other systems reviewed and are negative.   Physical Exam Updated Vital Signs BP (!) 128/90 (BP Location: Right Arm)   Pulse 75   Temp 98.1 F (36.7 C) (Oral)   Resp 16   Ht 6' (1.829 m)   Wt 106.6 kg   SpO2 98%   BMI 31.87 kg/m  Physical Exam Vitals and nursing note reviewed.  Constitutional:      Appearance: Normal appearance.  HENT:     Head: Normocephalic and atraumatic.     Nose: Nose normal.  Eyes:     Pupils: Pupils are equal, round, and reactive to light.  Cardiovascular:     Rate and Rhythm: Normal rate.     Pulses:          Dorsalis pedis pulses are 2+ on the right side and  2+ on the left side.       Posterior tibial pulses are 2+ on the right side and 2+ on the left side.  Pulmonary:     Effort: Pulmonary effort is normal.  Abdominal:     General: Abdomen is flat.  Musculoskeletal:     Cervical back: Normal range of motion and neck supple.       Feet:  Feet:     Right foot:     Skin integrity: Callus and dry skin present. No erythema.     Toenail Condition: Right toenails are abnormally thick.     Left foot:     Skin integrity: Erythema, callus and dry skin present.     Toenail Condition: Left toenails are abnormally thick.     Comments: 2+ DP,PT pulses bilaterally 5/5 flexion and extension to BLE Sensation is intact throughout with good capillary refill.  Neurological:     Mental Status: He is alert.      ED Results / Procedures / Treatments   Labs (all labs ordered are listed, but only abnormal results are displayed) Labs Reviewed  CBC WITH DIFFERENTIAL/PLATELET - Abnormal; Notable for the following components:      Result Value   WBC 10.9 (*)    Hemoglobin 17.1 (*)    Monocytes Absolute 1.2 (*)    Eosinophils Absolute 0.7 (*)    All other components within normal limits  COMPREHENSIVE METABOLIC PANEL - Abnormal; Notable for the following components:   Glucose, Bld 113 (*)    All other components within normal limits  CULTURE, BLOOD (ROUTINE X 2)  CULTURE, BLOOD (ROUTINE X 2)  LACTIC ACID, PLASMA  LACTIC ACID, PLASMA    EKG None  Radiology DG Foot Complete Left  Result Date: 03/11/2022 CLINICAL DATA:  Acute LEFT foot pain and swelling.  No known injury. EXAM: LEFT FOOT - COMPLETE 3 VIEW COMPARISON:  08/21/2020 and prior studies FINDINGS: There is no evidence of acute fracture, subluxation or dislocation. No radiographic evidence of osteomyelitis noted. No focal bony lesions are identified. Dorsal soft tissue swelling is present. IMPRESSION: Dorsal soft tissue swelling without acute bony abnormality. Electronically Signed   By: Harmon PierJeffrey  Hu M.D.   On: 03/11/2022 08:20    Procedures Procedures    Medications Ordered in ED Medications  ketorolac (TORADOL) 15 MG/ML injection 15 mg (has no administration in time range)  acetaminophen (TYLENOL) tablet 650 mg (650 mg Oral Given 03/11/22 0816)    ED Course/ Medical Decision Making/ A&P                           Medical Decision Making Amount and/or Complexity of Data Reviewed Labs: ordered. Radiology: ordered.  Risk OTC drugs. Prescription drug management.   This patient presents to the ED for concern of left foot pain, this involves a number of treatment options, and is a complaint that carries with it a high risk of complications and morbidity.  The differential diagnosis includes gout, cellulitis, ischemia versus  trauma.   Co morbidities: Discussed in HPI   Brief History:  Patient here with 2 days of left foot pain exacerbated with any type of ambulation or palpation.  No prior history of gout, does have a prior history of DVT and currently anticoagulated on Eliquis.  Denying any trauma, no systemic signs such as fever or chills.  Applied some athlete's foot cream without any improvement in his symptoms.  EMR reviewed including pt PMHx,  past surgical history and past visits to ER.   See HPI for more details   Lab Tests:  I ordered and independently interpreted labs.  The pertinent results include:    I personally reviewed all laboratory work and imaging. Metabolic panel without any acute abnormality specifically kidney function within normal limits and no significant electrolyte abnormalities. CBC without leukocytosis or significant anemia.   Imaging Studies:  NAD. I personally reviewed all imaging studies and no acute abnormality found. I agree with radiology interpretation.  Medicines ordered:  I ordered medication including tylenol for pain control Reevaluation of the patient after these medicines showed that the patient improved I have reviewed the patients home medicines and have made adjustments as needed  Reevaluation:  After the interventions noted above I re-evaluated patient and found that they have :stayed the same   Social Determinants of Health:  The patient's social determinants of health were a factor in the care of this patient  Problem List / ED Course:  Patient here with 2 days of left foot pain which began while he was at home, no prior history of trauma, not running any fevers.  No prior history of gout.  During evaluation there is erythema at the base of the left third and fourth metatarsals.  There is some residual athletes cream that he has been placing to it.  He has not been running any fevers, no chills, no systemic signs. Reassuring exam with good pulses  bilaterally, sensation is intact throughout. Given tylenol for pain control.  Interpretation of his labs by me reveal a CBC with a slight leukocytosis of 10.9, suspect likely reactive due to ongoing pain.  CMP with no electrolyte derangement, his creatinine levels within normal limits.  Lactic acid is negative.  X-ray did not show any acute findings.  I have a lower suspicion for cellulitis at this time is pain is discussed as very severe and suddenly, there is no streaking in the skin, it does not involve the entire foot however is isolated to the base of the third and fourth metatarsal.  Patient does not have any prior history of diabetes, I do feel that is reasonable to place him on a short course of steroids to help with symptoms.  Patient continues to refuse narcotic pain medication at this time.  We discussed alternating Tylenol ibuprofen to help with his symptoms.  He does work as a Scientist, forensic for [employer], therefore we will take him off work as gout is to his left foot.  I did discuss strict return precautions with him and wife at the bedside.  Patient is agreeable to plan and treatment.  Patient stable for discharge.   Dispostion:  After consideration of the diagnostic results and the patients response to treatment, I feel that the patent would benefit from steroid course along with pain medication to help treat his likely gout.    Portions of this note were generated with Scientist, clinical (histocompatibility and immunogenetics). Dictation errors may occur despite best attempts at proofreading.  Final Clinical Impression(s) / ED Diagnoses Final diagnoses:  Left foot pain  Acute gout involving toe of left foot, unspecified cause    Rx / DC Orders ED Discharge Orders          Ordered    predniSONE (DELTASONE) 20 MG tablet  Daily        03/11/22 0849              Claude Manges, PA-C 03/11/22 0859    Pfeiffer,  Lebron Conners, MD 03/11/22 [phone]

## 2022-03-11 NOTE — ED Notes (Signed)
Discharge paperwork given and verbally understood. 

## 2022-03-11 NOTE — ED Triage Notes (Signed)
Seen for foot pain earlier today. Treated for gout and given steroids.  Comes back tonight with draining wound on left foot concern for infection

## 2022-03-11 NOTE — ED Triage Notes (Signed)
Pt states left foot started aching Wednesday. Red swollen, toes hurting on left foot. Denies diabetes .

## 2022-03-11 NOTE — Discharge Instructions (Addendum)
You are prescribed a short course of steroids steroids to help with your symptoms.  Please take 2 tablets daily for the next 5 days.  In addition you may also take some Tylenol and alternate this with ibuprofen to help with your symptoms.  You experience any worsening symptoms, worsening pain you will need to return to the emergency department.

## 2022-03-12 ENCOUNTER — Emergency Department (HOSPITAL_BASED_OUTPATIENT_CLINIC_OR_DEPARTMENT_OTHER)
Admission: EM | Admit: 2022-03-12 | Discharge: 2022-03-12 | Disposition: A | Discharge status: Home / Self Care | Payer: Self-pay | Attending: Emergency Medicine | Admitting: Emergency Medicine

## 2022-03-12 DIAGNOSIS — L0390 Cellulitis, unspecified: Secondary | ICD-10-CM

## 2022-03-12 DIAGNOSIS — B353 Tinea pedis: Secondary | ICD-10-CM

## 2022-03-12 MED ORDER — CEPHALEXIN 500 MG PO CAPS: 500.0000 mg | ORAL_CAPSULE | Freq: Four times a day (QID) | ORAL | 0 refills | Status: DC

## 2022-03-12 MED ORDER — DOXYCYCLINE HYCLATE 100 MG PO TABS
100.0000 mg | ORAL_TABLET | Freq: Once | ORAL | Status: AC
  Administered 2022-03-12: 100 mg via ORAL
  Filled 2022-03-12: qty 1

## 2022-03-12 MED ORDER — DOXYCYCLINE HYCLATE 100 MG PO CAPS: 100.0000 mg | ORAL_CAPSULE | Freq: Two times a day (BID) | ORAL | 0 refills | Status: DC

## 2022-03-12 MED ORDER — TETANUS-DIPHTH-ACELL PERTUSSIS 5-2.5-18.5 LF-MCG/0.5 IM SUSY
0.5000 mL | PREFILLED_SYRINGE | Freq: Once | INTRAMUSCULAR | Status: AC
  Administered 2022-03-12: 0.5 mL via INTRAMUSCULAR
  Filled 2022-03-12: qty 0.5

## 2022-03-12 NOTE — ED Provider Notes (Signed)
[facility] EMERGENCY DEPT Provider Note   CSN: [id number] Arrival date & time: 03/11/22  2322     History  Chief Complaint  Patient presents with   Foot Pain    [name] [name] is a 41 y.o. male.  The history is provided by the patient.  Foot Pain This is a recurrent problem. The problem occurs constantly. The problem has not changed since onset.Pertinent negatives include no chest pain, no abdominal pain, no headaches and no shortness of breath. Nothing aggravates the symptoms. Nothing relieves the symptoms. The treatment provided no relief.  Patient on Eliquis seen earlier for athlete's foot and gout presents with wound that opened between the second and third digits of the left foot.       Home Medications Prior to Admission medications   Medication Sig Start Date End Date Taking? Authorizing Provider  acetaminophen (TYLENOL) 325 MG tablet Take 2 tablets (650 mg total) by mouth every 6 (six) hours as needed for mild pain (or temp > 100). 04/26/18   Sherrie George, PA-C  ELIQUIS 5 MG TABS tablet Take 1 tablet by mouth twice daily 02/04/22   Adron Bene, MD  oxyCODONE-acetaminophen (PERCOCET/ROXICET) 5-325 MG tablet Take 1-2 tablets by mouth every 4 (four) hours as needed for moderate pain. 07/03/20   Baglia, Corrina, PA-C  predniSONE (DELTASONE) 20 MG tablet Take 2 tablets (40 mg total) by mouth daily for 5 days. 03/11/22 03/16/22  Claude Manges, PA-C  enoxaparin (LOVENOX) 120 MG/0.8ML injection Inject 0.76 mLs (115 mg total) into the skin every 12 (twelve) hours. 04/26/18 10/11/19  Sherrie George, PA-C  warfarin (COUMADIN) 10 MG tablet Take 2 tablets (20 mg total) by mouth daily. 04/26/18 10/11/19  Sherrie George, PA-C      Allergies    Patient has no known allergies.    Review of Systems   Review of Systems  Constitutional:  Negative for fever.  HENT:  Negative for facial swelling.   Eyes:  Negative for redness.  Respiratory:  Negative for shortness  of breath.   Cardiovascular:  Negative for chest pain.  Gastrointestinal:  Negative for abdominal pain.  Skin:  Positive for wound.  Neurological:  Negative for headaches.  All other systems reviewed and are negative.   Physical Exam Updated Vital Signs BP 127/79   Pulse 78   Temp 98 F (36.7 C) (Oral)   Resp 17   SpO2 95%  Physical Exam Vitals and nursing note reviewed.  Constitutional:      General: He is not in acute distress.    Appearance: He is well-developed. He is not diaphoretic.  HENT:     Head: Normocephalic and atraumatic.     Nose: Nose normal.  Eyes:     Conjunctiva/sclera: Conjunctivae normal.     Pupils: Pupils are equal, round, and reactive to light.  Cardiovascular:     Rate and Rhythm: Normal rate and regular rhythm.     Pulses: Normal pulses.     Heart sounds: Normal heart sounds.  Pulmonary:     Effort: Pulmonary effort is normal.     Breath sounds: Normal breath sounds. No wheezing or rales.  Abdominal:     General: Bowel sounds are normal.     Palpations: Abdomen is soft.     Tenderness: There is no abdominal tenderness. There is no guarding or rebound.  Musculoskeletal:        General: Normal range of motion.     Cervical back: Normal range of motion and  neck supple.  Skin:    General: Skin is warm and dry.     Capillary Refill: Capillary refill takes less than 2 seconds.     Coloration: Skin is not ashen, cyanotic or mottled.          Comments: Linear split of the skin.  Mild erythema, no warmth no fluctuance, no crepitance.  No pain with palpation.  3+ dorsalis pedis of the left foot   Neurological:     Mental Status: He is alert and oriented to person, place, and time.     ED Results / Procedures / Treatments   Labs (all labs ordered are listed, but only abnormal results are displayed) Labs Reviewed - No data to display  EKG None  Radiology DG Foot Complete Left  Result Date: 03/11/2022 CLINICAL DATA:  Acute LEFT foot pain  and swelling.  No known injury. EXAM: LEFT FOOT - COMPLETE 3 VIEW COMPARISON:  08/21/2020 and prior studies FINDINGS: There is no evidence of acute fracture, subluxation or dislocation. No radiographic evidence of osteomyelitis noted. No focal bony lesions are identified. Dorsal soft tissue swelling is present. IMPRESSION: Dorsal soft tissue swelling without acute bony abnormality. Electronically Signed   By: Harmon Pier M.D.   On: 03/11/2022 08:20    Procedures Procedures    Medications Ordered in ED Medications  doxycycline (VIBRA-TABS) tablet 100 mg (100 mg Oral Given 03/12/22 0332)  Tdap (BOOSTRIX) injection 0.5 mL (0.5 mLs Intramuscular Given 03/12/22 0331)    ED Course/ Medical Decision Making/ A&P                           Medical Decision Making Seen this afternoon for gout and a linear opening between 2/3rd digits developed since leaving   Amount and/or Complexity of Data Reviewed Independent Historian: spouse    Details: See above  External Data Reviewed: labs and notes.    Details: Last ED visit reviewed   Risk Prescription drug management. Risk Details: Wound care in the ED.  Tetanus updated.  Will treat with doxycycline and keflex.  Wound care instructions given.  Will refer to podiatry.  Strict return precautions given.  Stop steroids.      Final Clinical Impression(s) / ED Diagnoses Final diagnoses:  None   Return for intractable cough, coughing up blood, fevers > 100.4 unrelieved by medication, shortness of breath, intractable vomiting, chest pain, shortness of breath, weakness, numbness, changes in speech, facial asymmetry, abdominal pain, passing out, Inability to tolerate liquids or food, cough, altered mental status or any concerns. No signs of systemic illness or infection. The patient is nontoxic-appearing on exam and vital signs are within normal limits.  I have reviewed the triage vital signs and the nursing notes. Pertinent labs & imaging results that were  available during my care of the patient were reviewed by me and considered in my medical decision making (see chart for details). After history, exam, and medical workup I feel the patient has been appropriately medically screened and is safe for discharge home. Pertinent diagnoses were discussed with the patient. Patient was given return precautions.  Rx / DC Orders ED Discharge Orders     None         Special Ranes, MD 03/12/22 6286

## 2022-03-14 ENCOUNTER — Ambulatory Visit (INDEPENDENT_AMBULATORY_CARE_PROVIDER_SITE_OTHER): Payer: Self-pay | Admitting: Student

## 2022-03-14 VITALS — BP 130/86 | HR 81 | Temp 97.0°F | Wt 241.3 lb

## 2022-03-14 DIAGNOSIS — L03032 Cellulitis of left toe: Secondary | ICD-10-CM

## 2022-03-14 DIAGNOSIS — D6851 Activated protein C resistance: Secondary | ICD-10-CM

## 2022-03-14 DIAGNOSIS — Z87891 Personal history of nicotine dependence: Secondary | ICD-10-CM

## 2022-03-14 MED ORDER — APIXABAN 5 MG PO TABS: 5.0000 mg | ORAL_TABLET | Freq: Two times a day (BID) | ORAL | 11 refills | Status: DC

## 2022-03-14 MED ORDER — NICOTINE 14 MG/24HR TD PT24: 14.0000 mg | MEDICATED_PATCH | TRANSDERMAL | 6 refills | Status: DC

## 2022-03-14 NOTE — Assessment & Plan Note (Signed)
Patient presents after going to the emergency department twice last week (11/24 and 11/25).  He was prescribed prednisone for 5 days at first for possible gout and then doxycycline and cephalexin for 7 days for cellulitis.  He is taking all 3 of these at this moment.  He notes a vast improvement in his symptoms.  He thinks the swelling is almost completely gone down but there is still associated erythema and tenderness at the base of the second and third toe on the left.  The laceration between these 2 toes in the webbing appears to be clean without any drainage.  He is wrapping himself and changing the wrapping daily.  He was given more materials to help with wrapping. - Continue with 7 days of cephalexin and doxycycline for cellulitis - Continue with 5 days of prednisone - Strict return precautions

## 2022-03-14 NOTE — Progress Notes (Signed)
   CC: Follow-up  HPI:  [name] is a 41 y.o. male with history as below who presents for an overdue follow-up.  Please see encounters tab for problem-based charting.  Past Medical History:  Diagnosis Date   Anxiety and depression    DVT (deep venous thrombosis) (HCC)    Pt unsure of date    Factor V Leiden mutation (HCC)    With resultant hypercoaguable state, Hx of DVT, PE, SP IVC filter placement, on chronic coumadin and requires high doses to maintine IRN 2-3.   GERD (gastroesophageal reflux disease)    history of   PE (pulmonary thromboembolism) (HCC)    unsure of date   Pneumonia    Pneumothorax 8/11   HX of, requiring chest tube/hospitalization.    Venous stasis    bilateral LE.   Review of Systems:   A comprehensive review of systems was negative except for: Swelling and pain at the base of the left second and third toe with a laceration without purulent drainage    Physical Exam:  Vitals:   03/14/22 0828 03/14/22 0901  BP: (!) 148/76 130/86  Pulse: 89 81  Temp: (!) 97 F (36.1 C)   TempSrc: Oral   SpO2: 99%   Weight: 241 lb 4.8 oz (109.5 kg)    Constitutional: Middle-age male who appears slightly older than stated age. In no acute distress. HENT: Normocephalic, atraumatic,  Eyes: Sclera non-icteric, EOM intact Cardio:Regular rate and rhythm. 2+ bilateral radial and dorsalis pedis  pulses. Pulm:Clear to auscultation bilaterally. Normal work of breathing on room air. MSK: 1+ pitting edema in the lower extremities bilaterally below the knees.  Erythema and tenderness at the base of the left 2/3 toes with associated laceration between these toes in the webbing without drainage. Skin:Warm and dry.  Venous stasis changes in bilateral lower extremities.  Erythema at the base of the second and third toes on the left foot that extends about an inch proximally Neuro:Alert and oriented x3. No focal deficit noted. Psych:Pleasant mood and affect.   Assessment &  Plan:   See Encounters Tab for problem based charting.  Patient seen with Dr. Criselda Peaches

## 2022-03-14 NOTE — Assessment & Plan Note (Signed)
Patient has recently started cutting back on the amount he is working.  He was previously up to about 1 pack/day and is now down to 1/2 pack/day.  He notes that he is slowly decreased the amount he is smoking.  He notes that this has been possible due to his new relationship and overall decrease in stress in his life.  He would like to set a quit date of his birthday 06/30/2024.  He will continue to slowly decrease the amount he is smoking by limiting the amount of cigarettes he has available and limiting the amount he smokes in certain situations or settings.  He does not want to start nicotine patches right now but will pick them up if needed.  Prescription was sent in. - Quit date 06/30/2022, nicotine patches sent in

## 2022-03-14 NOTE — Assessment & Plan Note (Signed)
Patient continues on Eliquis 5 mg twice daily without issue.  He denies any symptoms associated with DVT or PE.  He did have an IVC filter that was removed in 06/2020 without subsequent follow-up with vascular.  He denies any recent contact with vascular and is not interested in seeking out follow-up. - Continue Eliquis 5 mg twice daily

## 2022-03-14 NOTE — Patient Instructions (Signed)
  Thank you, Mr.Arham T Czerniak, for allowing [place] to provide your care today. Today we discussed . . .  > Left foot infection       -Please continue to take the prednisone, doxycycline, and cephalexin as prescribed.  If you notice any worsening of your symptoms or any new symptoms such as fever, pain or redness moving up your leg or anything else that is concerning please do not hesitate to call our office. > Factor V Leiden       -Please continue to take your Eliquis.  We have sent in refills today.  If you have any concerning symptoms for DVT or PE please not hesitate to call our office or go to the emergency department. > Smoking cessation       -As we discussed I think your goal of quitting by your next birthday is a great goal.  Please continue to cut back on your daily cigarette use and use the nicotine patches we have sent to your pharmacy if you need them.  If you have any issues please do not hesitate to call our office.  Will always be here to help and have other methods to help decrease or quit smoking.   I have ordered the following labs for you:  Lab Orders  No laboratory test(s) ordered today      Tests ordered today:  None   Referrals ordered today:   Referral Orders  No referral(s) requested today      I have ordered the following medication/changed the following medications:   Stop the following medications: Medications Discontinued During This Encounter  Medication Reason   oxyCODONE-acetaminophen (PERCOCET/ROXICET) 5-325 MG tablet Completed Course   ELIQUIS 5 MG TABS tablet Reorder     Start the following medications: Meds ordered this encounter  Medications   apixaban (ELIQUIS) 5 MG TABS tablet    Sig: Take 1 tablet (5 mg total) by mouth 2 (two) times daily.    Dispense:  60 tablet    Refill:  11   nicotine (NICODERM CQ - DOSED IN MG/24 HOURS) 14 mg/24hr patch    Sig: Place 1 patch (14 mg total) onto the skin daily.    Dispense:  30 patch    Refill:   6      Follow up: 4-6 months    Remember:     Should you have any questions or concerns please call the internal medicine clinic at [phone].     Rocky Morel, DO [facility]

## 2022-03-16 LAB — CULTURE, BLOOD (ROUTINE X 2)
Culture: NO GROWTH
Culture: NO GROWTH
Special Requests: ADEQUATE
Special Requests: ADEQUATE

## 2022-03-21 NOTE — Progress Notes (Signed)
Internal Medicine Clinic Attending  Case discussed with Dr. Goodwin  at the time of the visit.  We reviewed the resident's history and exam and pertinent patient test results.  I agree with the assessment, diagnosis, and plan of care documented in the resident's note.  

## 2022-04-04 ENCOUNTER — Encounter: Payer: Self-pay | Admitting: Student

## 2022-08-09 ENCOUNTER — Telehealth: Payer: Self-pay | Admitting: *Deleted

## 2022-08-09 NOTE — Telephone Encounter (Signed)
Patient's SO called in stating patient recently lost job and insurance. Eliquis will cost $600. She is requesting samples. States he starts a new job on 08/12/22 and insurance will start 09/17/22.  We only have 2 boxes of Eliquis 5 mg (28 tabs total) which is a 2 week supply. They will p/u these boxes today. They will call back in 2 weeks to see if we have more samples to get him to June 1st.

## 2022-08-23 ENCOUNTER — Telehealth: Payer: Self-pay | Admitting: *Deleted

## 2022-08-23 NOTE — Telephone Encounter (Signed)
Samples provided to patients significant other in clinic.

## 2022-08-23 NOTE — Telephone Encounter (Signed)
Call from patient's significant other requesting samples of Eliquis.  Patient has no insurance until 09/17/2022.  Significant other to come by today to pick up samples of Eliquis 5 mg tablets.  28 day supply.

## 2022-09-05 ENCOUNTER — Telehealth: Payer: Self-pay

## 2022-09-05 NOTE — Telephone Encounter (Signed)
Requesting sample on Eliquis.

## 2022-09-06 NOTE — Telephone Encounter (Signed)
[name] [name], can you help this pt with getting Eliquis since his significant other stated he will not have insurance until July instead of June.

## 2022-09-06 NOTE — Telephone Encounter (Signed)
Pt's significant other, Mardella Layman, stated she can pick up Eliquis 5 mg samples tomorrow or pt will pick up if he gets off of work early. I stated this should last pt until he gets insurance in June; she stated he will not have insurance until July.

## 2022-09-07 NOTE — Telephone Encounter (Signed)
Mailed BMS application to pt's home.

## 2022-09-08 NOTE — Telephone Encounter (Signed)
Pt's significant other picked up Eliquis samples 5mg  #42  (3wk supply) Dose verified with Dr Peterson Lombard  Pt to have insurance in  "July"

## 2022-10-06 ENCOUNTER — Telehealth: Payer: Self-pay

## 2022-10-06 NOTE — Telephone Encounter (Signed)
Call to patient after locating samples of Eliquis 2.5 mg.  Dr. Sol Blazing approved patient to get 2 boxes of the 2.5.mg tablets.  Patient is to come in for an appointment on Monday 10/10/2022 at 9:15 am.

## 2022-10-06 NOTE — Telephone Encounter (Signed)
Patients spouse called the patient is requesting samples of eliquis 5mg  patient only has enough for tomorrow. Please return patients call [phone]

## 2022-10-06 NOTE — Telephone Encounter (Addendum)
Medication Samples to be provided to the patient.  Drug name: apixaban (Eliquis)       Strength: 2.5 mg        Qty: 28   LOT: [id number]   Exp.Date: 07/2023  Dosing instructions: Take 2 tablets (5 mg) by mouth twice daily.  F/u appointment on Monday, 6/24, at 9:15.  Dickie La 4:33 PM 10/06/2022

## 2022-10-10 ENCOUNTER — Telehealth: Payer: Self-pay | Admitting: Student

## 2022-10-10 NOTE — Telephone Encounter (Signed)
Patient was called at the request of Dr. Dickie La to see if he would be able to come in this Friday 10/14/22 at 10:45 am on Dr. Rudene Christians' schedule.  No answer and voicemail is not set up.  Made Hme and Annmarie aware if patient calls back to please change appointment from next Friday 10/21/22 to this Friday 10/14/22 if possible.

## 2022-10-12 NOTE — Telephone Encounter (Signed)
Patient walk-in 6/124/2024.  Did not keep appointment scheduled for today.  Requested samples of the Eliquis.  Was given Eliquis 2.5 mg tablets as patient's usual dosing of 5 mg was unavailable.  Patient was given 2.5 mg tablets #28 tablets per Dr. Sol Blazing order. Patient is to take 2 of the 2.5mg  tablets to equal his 5 mg dosing. Patient was advised to come in for an appointment as soon  as possible to continue getting the samples.  Patient stated is working at a new job and is difficult to get time off.  Will try to come in for an appointment as soon as he can.   Dr. Sol Blazing also spoke briefly to the patient as well.

## 2022-10-21 ENCOUNTER — Other Ambulatory Visit: Payer: Self-pay

## 2022-10-21 ENCOUNTER — Encounter: Payer: Self-pay | Admitting: Student

## 2022-10-21 ENCOUNTER — Ambulatory Visit (INDEPENDENT_AMBULATORY_CARE_PROVIDER_SITE_OTHER): Payer: Self-pay | Admitting: Student

## 2022-10-21 ENCOUNTER — Other Ambulatory Visit (HOSPITAL_COMMUNITY): Payer: Self-pay

## 2022-10-21 VITALS — BP 129/80 | HR 82 | Temp 97.8°F | Ht 72.0 in | Wt 251.1 lb

## 2022-10-21 DIAGNOSIS — I82512 Chronic embolism and thrombosis of left femoral vein: Secondary | ICD-10-CM

## 2022-10-21 DIAGNOSIS — F17210 Nicotine dependence, cigarettes, uncomplicated: Secondary | ICD-10-CM

## 2022-10-21 DIAGNOSIS — Z7901 Long term (current) use of anticoagulants: Secondary | ICD-10-CM

## 2022-10-21 DIAGNOSIS — Z87891 Personal history of nicotine dependence: Secondary | ICD-10-CM

## 2022-10-21 DIAGNOSIS — I82419 Acute embolism and thrombosis of unspecified femoral vein: Secondary | ICD-10-CM

## 2022-10-21 MED ORDER — RIVAROXABAN 20 MG PO TABS
20.0000 mg | ORAL_TABLET | Freq: Every day | ORAL | 2 refills | Status: DC
  Filled 2022-10-21: qty 30, 30d supply, fill #0
  Filled 2022-11-29: qty 30, 30d supply, fill #1
  Filled 2023-01-02: qty 30, 30d supply, fill #2

## 2022-10-21 MED ORDER — RIVAROXABAN 20 MG PO TABS
20.0000 mg | ORAL_TABLET | Freq: Every day | ORAL | 0 refills | Status: DC
  Filled 2022-10-21: qty 30, 30d supply, fill #0

## 2022-10-21 NOTE — Assessment & Plan Note (Addendum)
Reported smoking 1 pack-cigarette a day for the past 20 years, has been unsuccessful trying to cut back.  Patient was adequately counseled on cigarette smoking and ways to quit including medical therapy.  Patient reported trying the nicotine patch in the past but has not helped significantly. Bupropion,through the IM program was offered as an option but patient declined and said he will wait until his insurance kick in so he can try Chantix.  Continued counseling will be offered to patient during our next visit.

## 2022-10-21 NOTE — Patient Instructions (Addendum)
Thank you, [name] for allowing [place] to provide your care today. Today we discussed your long term anticoagulation. You reported your last dose of eliquis was last night.We discussed your desire to quit smoking and the remedies available to you.You decided to wait until your insurance from your job kicks in to start taking chantix.Today, - We will prescribe you xarelto with our pharmacy program ,20 mg once a day with a heavy meal. - You will come back to see me in a month to follow up on a more definite anticoagulation therapy   - Please don't hesitate to call me when you have further concerns   I have ordered the following labs for you:  Lab Orders  No laboratory test(s) ordered today     Tests ordered today:  N/A  Referrals ordered today:   Referral Orders  No referral(s) requested today     I have ordered the following medication/changed the following medications:   Stop the following medications: There are no discontinued medications.   Start the following medications: Meds ordered this encounter  Medications   rivaroxaban (XARELTO) 20 MG TABS tablet    Sig: Take 1 tablet (20 mg total) by mouth daily with supper.    Dispense:  30 tablet    Refill:  0     Follow up:  1 month     Should you have any questions or concerns please call the internal medicine clinic at [phone].    [name] [name], M.D [facility]

## 2022-10-21 NOTE — Progress Notes (Signed)
CC: Follow- up  HPI:  [name] is a 42 y.o. male living with a history stated below and presents today for follow up on his anticoagulation therapy . Please see problem based assessment and plan for additional details.  Past Medical History:  Diagnosis Date   Anxiety and depression    DVT (deep venous thrombosis) (HCC)    Pt unsure of date    Factor V Leiden mutation (HCC)    With resultant hypercoaguable state, Hx of DVT, PE, SP IVC filter placement, on chronic coumadin and requires high doses to maintine IRN 2-3.   GERD (gastroesophageal reflux disease)    history of   PE (pulmonary thromboembolism) (HCC)    unsure of date   Pneumonia    Pneumothorax 11/2009   HX of, requiring chest tube/hospitalization.    Pulmonary embolism (HCC) 12/30/2019   Bilateral pulmonary embolisms found on CT/angio chest on 9/13 during hospitalization.  On long-term anticoagulation with Eliquis 5 mg twice daily.   Venous stasis    bilateral LE.    Current Outpatient Medications on File Prior to Visit  Medication Sig Dispense Refill   acetaminophen (TYLENOL) 325 MG tablet Take 2 tablets (650 mg total) by mouth every 6 (six) hours as needed for mild pain (or temp > 100).     cephALEXin (KEFLEX) 500 MG capsule Take 1 capsule (500 mg total) by mouth 4 (four) times daily. 28 capsule 0   doxycycline (VIBRAMYCIN) 100 MG capsule Take 1 capsule (100 mg total) by mouth 2 (two) times daily. One po bid x 7 days 14 capsule 0   nicotine (NICODERM CQ - DOSED IN MG/24 HOURS) 14 mg/24hr patch Place 1 patch (14 mg total) onto the skin daily. 30 patch 6   [DISCONTINUED] enoxaparin (LOVENOX) 120 MG/0.8ML injection Inject 0.76 mLs (115 mg total) into the skin every 12 (twelve) hours. 30 Syringe 0   [DISCONTINUED] warfarin (COUMADIN) 10 MG tablet Take 2 tablets (20 mg total) by mouth daily. 60 tablet 0   No current facility-administered medications on file prior to visit.    No family history on  file.  Social History   Socioeconomic History   Marital status: Single    Spouse name: Not on file   Number of children: Not on file   Years of education: Not on file   Highest education level: Not on file  Occupational History   Not on file  Tobacco Use   Smoking status: Every Day    Packs/day: 0.50    Years: 20.00    Additional pack years: 0.00    Total pack years: 10.00    Types: Cigarettes    Start date: 07/07/2016   Smokeless tobacco: Former   Tobacco comments:    1/2 pk per day   Vaping Use   Vaping Use: Never used  Substance and Sexual Activity   Alcohol use: No   Drug use: Yes    Frequency: 1.0 times per week    Types: Marijuana    Comment: last used 06/30/20   Sexual activity: Not on file  Other Topics Concern   Not on file  Social History Narrative   Currently works for [employer], Lives with his girlfriend and has one son.    Social Determinants of Health   Financial Resource Strain: Not on file  Food Insecurity: No Food Insecurity (03/14/2022)   Hunger Vital Sign    Worried About Running Out of Food in the Last Year: Never true  Ran Out of Food in the Last Year: Never true  Transportation Needs: Not on file  Physical Activity: Not on file  Stress: Not on file  Social Connections: Not on file  Intimate Partner Violence: Not on file    Review of Systems: ROS negative except for what is noted on the assessment and plan.  Vitals:   10/21/22 0846  BP: 129/80  Pulse: 82  Temp: 97.8 F (36.6 C)  TempSrc: Oral  SpO2: 98%  Weight: 251 lb 1.6 oz (113.9 kg)  Height: 6' (1.829 m)    Physical Exam: Constitutional: well-appearing  Cardiovascular: regular rate and rhythm, no m/r/g Pulmonary/Chest: Mild wheezing noted on  left upper lobe on ascultation Neurological: alert & oriented x 3, no focal deficit Skin: Reddish discoloration on lower extremity bilaterally  Psych: normal mood and behavior  Assessment & Plan:   Dvt femoral (deep venous  thrombosis) (HCC) History of deep vein thrombosis currently on apixaban 5 mg tablets twice daily.  Patient has been getting samples from the [facility] for the past 2 months due to inability to afford for medication.  Presented today requesting additional samples from the [facility] clinic because his insurance has not kicked in yet from his job.  Patient reports his last dose of Eliquis was yesterday.  Denies any shortness of breath, lower extremity swelling and any signs of claudications. Plan - Switch to Xarelto 20 mg daily with food, to be picked from [facility] through the IM program - Follow up in a month to discuss a more definite and permanent anticoagulation plan  Quit smoking Reported smoking 1 pack-cigarette a day for the past 20 years, has been unsuccessful trying to cut back.  Patient was adequately counseled on cigarette smoking and ways to quit including medical therapy.  Patient reported trying the nicotine patch in the past but has not helped significantly. Bupropion,through the IM program was offered as an option but patient declined and said he will wait until his insurance kick in so he can try Chantix.  Continued counseling will be offered to patient during our next visit.  Patient seen with Dr. Dale [place], M.D [facility] Phone: [phone] Date 10/21/2022 Time 10:05 AM

## 2022-10-21 NOTE — Assessment & Plan Note (Addendum)
History of deep vein thrombosis currently on apixaban 5 mg tablets twice daily.  Patient has been getting samples from the [facility] for the past 2 months due to inability to afford for medication.  Presented today requesting additional samples from the [facility] clinic because his insurance has not kicked in yet from his job.  Patient reports his last dose of Eliquis was yesterday.  Denies any shortness of breath, lower extremity swelling and any signs of claudications. Plan - Switch to Xarelto 20 mg daily with food, to be picked from [facility] through the IM program - Follow up in a month to discuss a more definite and permanent anticoagulation plan

## 2022-10-24 NOTE — Progress Notes (Signed)
Internal Medicine Clinic Attending  I saw and evaluated the patient.  I personally confirmed the key portions of the history and exam documented by the resident  and I reviewed pertinent patient test results.  The assessment, diagnosis, and plan were formulated together and I agree with the documentation in the resident's note.  

## 2022-11-29 ENCOUNTER — Other Ambulatory Visit (HOSPITAL_COMMUNITY): Payer: Self-pay

## 2022-12-15 ENCOUNTER — Other Ambulatory Visit (HOSPITAL_COMMUNITY): Payer: Self-pay

## 2022-12-16 ENCOUNTER — Emergency Department (HOSPITAL_BASED_OUTPATIENT_CLINIC_OR_DEPARTMENT_OTHER)
Admission: EM | Admit: 2022-12-16 | Discharge: 2022-12-17 | Disposition: A | Discharge status: Home / Self Care | Payer: Self-pay | Attending: Emergency Medicine | Admitting: Emergency Medicine

## 2022-12-16 ENCOUNTER — Encounter (HOSPITAL_BASED_OUTPATIENT_CLINIC_OR_DEPARTMENT_OTHER): Payer: Self-pay

## 2022-12-16 ENCOUNTER — Other Ambulatory Visit: Payer: Self-pay

## 2022-12-16 ENCOUNTER — Telehealth: Payer: Self-pay | Admitting: *Deleted

## 2022-12-16 DIAGNOSIS — D72829 Elevated white blood cell count, unspecified: Secondary | ICD-10-CM | POA: Insufficient documentation

## 2022-12-16 DIAGNOSIS — Z7901 Long term (current) use of anticoagulants: Secondary | ICD-10-CM | POA: Insufficient documentation

## 2022-12-16 DIAGNOSIS — M7989 Other specified soft tissue disorders: Secondary | ICD-10-CM

## 2022-12-16 DIAGNOSIS — M79672 Pain in left foot: Secondary | ICD-10-CM | POA: Insufficient documentation

## 2022-12-16 LAB — BASIC METABOLIC PANEL
Anion gap: 10 (ref 5–15)
BUN: 16 mg/dL (ref 6–20)
CO2: 23 mmol/L (ref 22–32)
Calcium: 9.1 mg/dL (ref 8.9–10.3)
Chloride: 103 mmol/L (ref 98–111)
Creatinine, Ser: 0.97 mg/dL (ref 0.61–1.24)
GFR, Estimated: >60 mL/min (ref >60)
Glucose, Bld: 100 mg/dL — ABNORMAL HIGH (ref 70–99)
Potassium: 3.9 mmol/L (ref 3.5–5.1)
Sodium: 136 mmol/L (ref 135–145)

## 2022-12-16 LAB — CBC WITH DIFFERENTIAL/PLATELET
Abs Immature Granulocytes: 0.05 10*3/uL (ref 0.00–0.07)
Basophils Absolute: 0.1 10*3/uL (ref 0.0–0.1)
Basophils Relative: 1 %
Eosinophils Absolute: 0.7 10*3/uL — ABNORMAL HIGH (ref 0.0–0.5)
Eosinophils Relative: 6 %
HCT: 43.8 % (ref 39.0–52.0)
Hemoglobin: 15.5 g/dL (ref 13.0–17.0)
Immature Granulocytes: 0 %
Lymphocytes Relative: 22 %
Lymphs Abs: 2.6 10*3/uL (ref 0.7–4.0)
MCH: 32.5 pg (ref 26.0–34.0)
MCHC: 35.4 g/dL (ref 30.0–36.0)
MCV: 91.8 fL (ref 80.0–100.0)
Monocytes Absolute: 1.3 10*3/uL — ABNORMAL HIGH (ref 0.1–1.0)
Monocytes Relative: 11 %
Neutro Abs: 6.9 10*3/uL (ref 1.7–7.7)
Neutrophils Relative %: 60 %
Platelets: 238 10*3/uL (ref 150–400)
RBC: 4.77 MIL/uL (ref 4.22–5.81)
RDW: 12.8 % (ref 11.5–15.5)
WBC: 11.6 10*3/uL — ABNORMAL HIGH (ref 4.0–10.5)
nRBC: 0 % (ref 0.0–0.2)

## 2022-12-16 MED ORDER — DOXYCYCLINE HYCLATE 100 MG PO TABS
100.0000 mg | ORAL_TABLET | Freq: Once | ORAL | Status: AC
  Administered 2022-12-17: 100 mg via ORAL
  Filled 2022-12-16: qty 1

## 2022-12-16 NOTE — Telephone Encounter (Signed)
Call from patient's significant other for appointment.  Patient has swelling in his left leg x 3-4 days.  Is red and some pain.  Has a history of Cellulitis.  Leg is warm.  No available appointments advised to go to ER or the Urgent Care.  Significant other to take patient to the ER today.

## 2022-12-16 NOTE — ED Triage Notes (Signed)
Pt states that he has had increase in swelling in left ankle and foot x 1 week. Pt has Factor V and is currently taking Xarelto. Denies specific injury. Pt has several open sores and scabs on ankle, as well as, some discoloration of skin and warm to touch.

## 2022-12-16 NOTE — ED Provider Notes (Addendum)
[place] EMERGENCY DEPARTMENT AT [facility]  Provider Note  CSN: [id number] Arrival date & time: 12/16/22 2121  History Chief Complaint  Patient presents with   Foot Pain    [name] [name] is a 42 y.o. male with history of factor V leiden, prior DVT and PE, currently on Xarelto, has been compliant. He reports about a week of L foot pain and swelling, not typically that swollen. He has had prior IVC filter, but since removed. He has had cellulitis in that leg before as well. Denies CP or SOB.    Home Medications Prior to Admission medications   Medication Sig Start Date End Date Taking? Authorizing Provider  acetaminophen (TYLENOL) 325 MG tablet Take 2 tablets (650 mg total) by mouth every 6 (six) hours as needed for mild pain (or temp > 100). 04/26/18   Sherrie George, PA-C  cephALEXin (KEFLEX) 500 MG capsule Take 1 capsule (500 mg total) by mouth 4 (four) times daily. 03/12/22   Palumbo, April, MD  doxycycline (VIBRAMYCIN) 100 MG capsule Take 1 capsule (100 mg total) by mouth 2 (two) times daily. One po bid x 7 days 03/12/22   Palumbo, April, MD  nicotine (NICODERM CQ - DOSED IN MG/24 HOURS) 14 mg/24hr patch Place 1 patch (14 mg total) onto the skin daily. 03/14/22   Rocky Morel, DO  rivaroxaban (XARELTO) 20 MG TABS tablet Take 1 tablet (20 mg total) by mouth daily with supper. 10/21/22   Kathleen Lime, MD  enoxaparin (LOVENOX) 120 MG/0.8ML injection Inject 0.76 mLs (115 mg total) into the skin every 12 (twelve) hours. 04/26/18 10/11/19  Sherrie George, PA-C  warfarin (COUMADIN) 10 MG tablet Take 2 tablets (20 mg total) by mouth daily. 04/26/18 10/11/19  Sherrie George, PA-C     Allergies    Patient has no known allergies.   Review of Systems   Review of Systems Please see HPI for pertinent positives and negatives  Physical Exam BP 122/77 (BP Location: Left Arm)   Pulse 80   Temp 97.7 F (36.5 C) (Oral)   Resp 18   Ht 5\' 11"  (1.803 m)   Wt 113.4 kg    SpO2 98%   BMI 34.87 kg/m   Physical Exam Vitals and nursing note reviewed.  Constitutional:      Appearance: Normal appearance.  HENT:     Head: Normocephalic and atraumatic.     Nose: Nose normal.     Mouth/Throat:     Mouth: Mucous membranes are moist.  Eyes:     Extraocular Movements: Extraocular movements intact.     Conjunctiva/sclera: Conjunctivae normal.  Cardiovascular:     Rate and Rhythm: Normal rate.  Pulmonary:     Effort: Pulmonary effort is normal.     Breath sounds: Normal breath sounds.  Abdominal:     General: Abdomen is flat.     Palpations: Abdomen is soft.     Tenderness: There is no abdominal tenderness.  Musculoskeletal:        General: Swelling and tenderness (left leg) present. Normal range of motion.     Cervical back: Neck supple.  Skin:    General: Skin is warm and dry.  Neurological:     General: No focal deficit present.     Mental Status: He is alert.  Psychiatric:        Mood and Affect: Mood normal.     ED Results / Procedures / Treatments   EKG None  Procedures Procedures  Medications  Ordered in the ED Medications  doxycycline (VIBRA-TABS) tablet 100 mg (has no administration in time range)    Initial Impression and Plan  Patient here with left leg pain and swelling, high risk for breakthrough DVT while on anticoagulation. Unfortunately, no Doppler imaging is available in the ED tonight. Offered observation in the ED here vs transfer to a facility that has doppler capabilities at night but he prefers to go home and return in the AM. Will give a dose of doxycycline for possible cellulitis, will avoid additional anticoagulation on top of his xarelto without confirmed acute thrombus. Advised to return in AM for [place], RTED sooner for any CP, SOB or other concerns.   If DVT is neg for acute thrombus, would treat for cellulitis. His labs done in triage show CBC with mild leukocytosis, BMP is normal.   ED Course       MDM  Rules/Calculators/A&P Medical Decision Making Problems Addressed: Left leg swelling: acute illness or injury  Amount and/or Complexity of Data Reviewed Radiology: ordered.  Risk Prescription drug management.     Final Clinical Impression(s) / ED Diagnoses Final diagnoses:  Left leg swelling    Rx / DC Orders ED Discharge Orders          Ordered    Lower Ext Left Venous US       Comments: IMPORTANT PATIENT INSTRUCTIONS:  You have been scheduled for an Outpatient Ultrasound.    Your appointment has been scheduled for:  _______ am/pm on _______________ (date).  If your appointment is scheduled for a Saturday, Sunday or holiday, please go to the [facility] Emergency Department Registration Desk at least 15 minutes prior to your appointment time and tell them you are there for an ultrasound.    If your appointment is scheduled for a weekday (Monday - Friday), please go directly to the [facility] Radiology Department reception area at least 15 minutes prior to your appointment time and tell them you are there for an ultrasound.  Please call [phone] with questions.   12/16/22 2356             Pollyann Savoy, MD 12/16/22 2356    Pollyann Savoy, MD 12/16/22 [phone]

## 2022-12-17 ENCOUNTER — Telehealth (HOSPITAL_BASED_OUTPATIENT_CLINIC_OR_DEPARTMENT_OTHER): Payer: Self-pay | Admitting: Emergency Medicine

## 2022-12-17 ENCOUNTER — Ambulatory Visit (HOSPITAL_BASED_OUTPATIENT_CLINIC_OR_DEPARTMENT_OTHER)
Admission: RE | Admit: 2022-12-17 | Discharge: 2022-12-17 | Disposition: A | Discharge status: Home / Self Care | Payer: Self-pay | Source: Ambulatory Visit | Attending: Emergency Medicine | Admitting: Emergency Medicine

## 2022-12-17 DIAGNOSIS — M7989 Other specified soft tissue disorders: Secondary | ICD-10-CM | POA: Insufficient documentation

## 2022-12-17 MED ORDER — OXYCODONE HCL 5 MG PO TABS: 5.0000 mg | ORAL_TABLET | Freq: Four times a day (QID) | ORAL | 0 refills | Status: DC | PRN

## 2022-12-17 MED ORDER — DOXYCYCLINE HYCLATE 100 MG PO CAPS: 100.0000 mg | ORAL_CAPSULE | Freq: Two times a day (BID) | ORAL | 0 refills | Status: DC

## 2022-12-17 NOTE — ED Provider Notes (Signed)
EMTALA form placed on this patient inadvertently. Please disregard.    Pollyann Savoy, MD 12/17/22 [phone]

## 2022-12-17 NOTE — ED Provider Notes (Signed)
Pt seen last night, returned today for duplex  Found to have likely acute on chronic DVT LLE femoral/popliteal veins  Switched from eliquis to xarelto last month (IMTS office note)  He has no respiratory complaints, no cp or dib. No hypoxia noted at visit last night  D/w vascular surgery Dr Randie Heinz; recommend f/u in the office next week  Give abx for ?cellulitis, continue DOAC  F/u pcp and Dr Zachery Conch, Paul Dykes, DO 12/17/22 1300

## 2022-12-17 NOTE — Telephone Encounter (Signed)
See my note from earlier today.

## 2022-12-21 ENCOUNTER — Ambulatory Visit (INDEPENDENT_AMBULATORY_CARE_PROVIDER_SITE_OTHER): Payer: Self-pay | Admitting: Vascular Surgery

## 2022-12-21 ENCOUNTER — Encounter: Payer: Self-pay | Admitting: Vascular Surgery

## 2022-12-21 VITALS — BP 127/84 | HR 87 | Temp 98.3°F | Ht 71.0 in | Wt 243.0 lb

## 2022-12-21 DIAGNOSIS — I872 Venous insufficiency (chronic) (peripheral): Secondary | ICD-10-CM

## 2022-12-21 NOTE — Progress Notes (Signed)
Patient ID: [name] [name], male   DOB: 1981/03/16, 42 y.o.   MRN: [id number]  Reason for Consult: Follow-up   Referred by [name] Lime, MD  Subjective:     HPI:  [name] [name] is a 42 y.o. male has a history of an IVC filter with factor V Leiden mutation.  He underwent stenting of his left common iliac vein and remove the IVC filter now 2-1/2 years ago and has done very well on Eliquis since that time.  Recently he changed jobs and this was switched to Xarelto this was approximately 6 weeks ago and now 2 weeks ago developed swelling in the left lower extremity was evaluated at [facility] found to have new onset DVT.  States that he still has persistent swelling although somewhat improved.  He has not been wearing compression stockings.  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  He has stable skin changes of the left ankle with no ulceration and no episodes of bleeding.  Past Medical History:  Diagnosis Date   Anxiety and depression    DVT (deep venous thrombosis) (HCC)    Pt unsure of date    Factor V Leiden mutation (HCC)    With resultant hypercoaguable state, Hx of DVT, PE, SP IVC filter placement, on chronic coumadin and requires high doses to maintine IRN 2-3.   GERD (gastroesophageal reflux disease)    history of   PE (pulmonary thromboembolism) (HCC)    unsure of date   Pneumonia    Pneumothorax 11/2009   HX of, requiring chest tube/hospitalization.    Pulmonary embolism (HCC) 12/30/2019   Bilateral pulmonary embolisms found on CT/angio chest on 9/13 during hospitalization.  On long-term anticoagulation with Eliquis 5 mg twice daily.   Venous stasis    bilateral LE.   History reviewed. No pertinent family history. Past Surgical History:  Procedure Laterality Date   ANGIOPLASTY ILLIAC ARTERY Right 07/11/2016   Procedure: balloon ANGIOPLASTY of Inferior vena cava, right external iliac vein, right common femoral vein;   Surgeon: Maeola Harman, MD;  Location: [facility] OR;  Service: Vascular;  Laterality: Right;   CORONARY ULTRASOUND/IVUS Right 07/11/2016   Procedure: INTRAVASCULAR ULTRASOUND/IVUS of right popliteal vein, right common femoral vein, right femoral vein, right exterternal iliac vein, and right internal iliac vein;  Surgeon: Maeola Harman, MD;  Location: [facility] OR;  Service: Vascular;  Laterality: Right;   LOWER EXTREMITY ANGIOGRAPHY N/A 07/12/2016   Procedure: LYSIS RECHECK;  Surgeon: Nada Libman, MD;  Location: [facility] INVASIVE CV LAB;  Service: Cardiovascular;  Laterality: N/A;   LOWER EXTREMITY VENOGRAPHY N/A 04/13/2020   Procedure: LOWER EXTREMITY VENOGRAPHY - Left Leg;  Surgeon: Maeola Harman, MD;  Location: [facility] INVASIVE CV LAB;  Service: Cardiovascular;  Laterality: N/A;   placement of IVC filter  8/09   Placement of large-bore right chest tube  8/11   right minithoracotomy with evacuation of hematoma  8/09   TONSILLECTOMY     removed as a child   ULTRASOUND GUIDANCE FOR VASCULAR ACCESS Right 07/11/2016   Procedure: ULTRASOUND GUIDANCE FOR VASCULAR ACCESS;  Surgeon: Maeola Harman, MD;  Location: [facility] OR;  Service: Vascular;  Laterality: Right;   VENOGRAM Left 07/02/2020   Procedure: LEFT LOWER EXTREMITY VENOGRAM WITH STENT PLACEMENT;  Surgeon: Maeola Harman, MD;  Location: [facility] OR;  Service: Vascular;  Laterality: Left;    Short Social History:  Social History  Tobacco Use   Smoking status: Every Day    Current packs/day: 0.50    Average packs/day: 0.5 packs/day for 20.0 years (10.0 ttl pk-yrs)    Types: Cigarettes    Start date: 07/07/2016   Smokeless tobacco: Former   Tobacco comments:    1/2 pk per day   Substance Use Topics   Alcohol use: No    No Known Allergies  Current Outpatient Medications  Medication Sig Dispense Refill   acetaminophen (TYLENOL) 325 MG tablet Take 2 tablets (650 mg total) by mouth every 6 (six) hours as needed for  mild pain (or temp > 100).     doxycycline (VIBRAMYCIN) 100 MG capsule Take 1 capsule (100 mg total) by mouth 2 (two) times daily. 20 capsule 0   rivaroxaban (XARELTO) 20 MG TABS tablet Take 1 tablet (20 mg total) by mouth daily with supper. 30 tablet 2   oxyCODONE (ROXICODONE) 5 MG immediate release tablet Take 1 tablet (5 mg total) by mouth every 6 (six) hours as needed for severe pain. (Patient not taking: Reported on 12/21/2022) 5 tablet 0   No current facility-administered medications for this visit.    Review of Systems  Constitutional:  Constitutional negative. HENT: HENT negative.  Eyes: Eyes negative.  Respiratory: Respiratory negative.  Cardiovascular: Positive for leg swelling.  GI: Gastrointestinal negative.  Musculoskeletal: Positive for leg pain.  Neurological: Neurological negative. Hematologic: Hematologic/lymphatic negative.  Psychiatric: Psychiatric negative.        Objective:  Objective   Vitals:   12/21/22 1216  BP: 127/84  Pulse: 87  Temp: 98.3 F (36.8 C)  SpO2: 95%     Physical Exam HENT:     Head: Normocephalic.     Nose: Nose normal.     Mouth/Throat:     Mouth: Mucous membranes are moist.  Eyes:     Pupils: Pupils are equal, round, and reactive to light.  Cardiovascular:     Rate and Rhythm: Normal rate.  Pulmonary:     Effort: Pulmonary effort is normal.  Musculoskeletal:     Right lower leg: No edema.     Left lower leg: Edema present.  Skin:    General: Skin is warm.     Capillary Refill: Capillary refill takes less than 2 seconds.  Neurological:     General: No focal deficit present.     Mental Status: He is alert.  Psychiatric:        Mood and Affect: Mood normal.     Data: US IMPRESSION: 1. Mix of probable acute on chronic DVT throughout the left lower extremity primarily affecting the femoral and popliteal veins. 2. Eccentric wall thickening consistent with prior DVT in the contralateral right common femoral vein. 3.  Superficial thrombophlebitis involving the small saphenous vein along the posterior aspect of the left calf.     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  Unfortunately we have not evaluated him in 2-1/2 years I am concerned that his new onset swelling could be as a result at occlusion of his left common iliac vein stent.  Swelling is not that impressive on today's exam but he has not been really on his feet.  I have recommended compression stockings they can be knee-high as I think he is more likely to wear these and medium compression.  I will get him back urgently in  the next 2 to 3 weeks with IVC iliac duplex to evaluate for occlusion and possibly will intervention to reopen his stents.  I would not consider this in Xarelto failure given that we have not followed his stents recently.  All questions were answered in the presence of his significant other.     Maeola Harman MD [facility]

## 2022-12-22 ENCOUNTER — Telehealth: Payer: Self-pay

## 2022-12-22 NOTE — Telephone Encounter (Signed)
Please initiate PAP for patient (JJPAF - uninsured)  Switching from IM program.

## 2023-01-02 ENCOUNTER — Other Ambulatory Visit (HOSPITAL_COMMUNITY): Payer: Self-pay

## 2023-01-02 NOTE — Telephone Encounter (Signed)
Application being mailed to pt home

## 2023-01-03 ENCOUNTER — Other Ambulatory Visit: Payer: Self-pay

## 2023-01-03 DIAGNOSIS — I872 Venous insufficiency (chronic) (peripheral): Secondary | ICD-10-CM

## 2023-01-11 ENCOUNTER — Encounter: Payer: Self-pay | Admitting: Vascular Surgery

## 2023-01-11 ENCOUNTER — Ambulatory Visit (INDEPENDENT_AMBULATORY_CARE_PROVIDER_SITE_OTHER): Payer: Self-pay | Admitting: Vascular Surgery

## 2023-01-11 ENCOUNTER — Ambulatory Visit (HOSPITAL_COMMUNITY)
Admission: RE | Admit: 2023-01-11 | Discharge: 2023-01-11 | Disposition: A | Discharge status: Home / Self Care | Payer: Self-pay | Source: Ambulatory Visit | Attending: Vascular Surgery | Admitting: Vascular Surgery

## 2023-01-11 VITALS — BP 111/76 | HR 73 | Temp 97.9°F | Resp 20 | Ht 71.0 in | Wt 248.3 lb

## 2023-01-11 DIAGNOSIS — I872 Venous insufficiency (chronic) (peripheral): Secondary | ICD-10-CM | POA: Insufficient documentation

## 2023-01-11 DIAGNOSIS — I871 Compression of vein: Secondary | ICD-10-CM

## 2023-01-11 NOTE — Progress Notes (Signed)
Patient ID: [name] [name], male   DOB: 07-Aug-1980, 42 y.o.   MRN: [id number]  Reason for Consult: Follow-up   Referred by Kathleen Lime, MD  Subjective:     HPI:  [name] [name] is a 42 y.o. male history of an IVC filter placement and subsequent removal many years later with known factor V Leiden mutation.  Recently changed jobs and was switched to Xarelto which she is currently taking.  He was working for [employer] now he is planning to go back to moving and also has plans for a job with the city of [place].  He states that his swelling is much better since his last visit and he has been compliant with his compression stockings.  Past Medical History:  Diagnosis Date   Anxiety and depression    DVT (deep venous thrombosis) (HCC)    Pt unsure of date    Factor V Leiden mutation (HCC)    With resultant hypercoaguable state, Hx of DVT, PE, SP IVC filter placement, on chronic coumadin and requires high doses to maintine IRN 2-3.   GERD (gastroesophageal reflux disease)    history of   PE (pulmonary thromboembolism) (HCC)    unsure of date   Pneumonia    Pneumothorax 11/2009   HX of, requiring chest tube/hospitalization.    Pulmonary embolism (HCC) 12/30/2019   Bilateral pulmonary embolisms found on CT/angio chest on 9/13 during hospitalization.  On long-term anticoagulation with Eliquis 5 mg twice daily.   Venous stasis    bilateral LE.   History reviewed. No pertinent family history. Past Surgical History:  Procedure Laterality Date   ANGIOPLASTY ILLIAC ARTERY Right 07/11/2016   Procedure: balloon ANGIOPLASTY of Inferior vena cava, right external iliac vein, right common femoral vein;  Surgeon: Maeola Harman, MD;  Location: [facility] OR;  Service: Vascular;  Laterality: Right;   CORONARY ULTRASOUND/IVUS Right 07/11/2016   Procedure: INTRAVASCULAR ULTRASOUND/IVUS of right popliteal vein, right common femoral vein, right femoral vein, right exterternal iliac vein, and  right internal iliac vein;  Surgeon: Maeola Harman, MD;  Location: [facility] OR;  Service: Vascular;  Laterality: Right;   LOWER EXTREMITY ANGIOGRAPHY N/A 07/12/2016   Procedure: LYSIS RECHECK;  Surgeon: Nada Libman, MD;  Location: [facility] INVASIVE CV LAB;  Service: Cardiovascular;  Laterality: N/A;   LOWER EXTREMITY VENOGRAPHY N/A 04/13/2020   Procedure: LOWER EXTREMITY VENOGRAPHY - Left Leg;  Surgeon: Maeola Harman, MD;  Location: [facility] INVASIVE CV LAB;  Service: Cardiovascular;  Laterality: N/A;   placement of IVC filter  8/09   Placement of large-bore right chest tube  8/11   right minithoracotomy with evacuation of hematoma  8/09   TONSILLECTOMY     removed as a child   ULTRASOUND GUIDANCE FOR VASCULAR ACCESS Right 07/11/2016   Procedure: ULTRASOUND GUIDANCE FOR VASCULAR ACCESS;  Surgeon: Maeola Harman, MD;  Location: [facility] OR;  Service: Vascular;  Laterality: Right;   VENOGRAM Left 07/02/2020   Procedure: LEFT LOWER EXTREMITY VENOGRAM WITH STENT PLACEMENT;  Surgeon: Maeola Harman, MD;  Location: [facility] OR;  Service: Vascular;  Laterality: Left;    Short Social History:  Social History   Tobacco Use   Smoking status: Every Day    Current packs/day: 0.50    Average packs/day: 0.5 packs/day for 20.0 years (10.0 ttl pk-yrs)    Types: Cigarettes    Start date: 07/07/2016   Smokeless tobacco: Former   Tobacco comments:    1/2 pk per day  Substance Use Topics   Alcohol use: No    No Known Allergies  Current Outpatient Medications  Medication Sig Dispense Refill   acetaminophen (TYLENOL) 325 MG tablet Take 2 tablets (650 mg total) by mouth every 6 (six) hours as needed for mild pain (or temp > 100).     rivaroxaban (XARELTO) 20 MG TABS tablet Take 1 tablet (20 mg total) by mouth daily with supper. 30 tablet 2   No current facility-administered medications for this visit.    Review of Systems  Constitutional:  Constitutional negative. HENT: HENT  negative.  Eyes: Eyes negative.  Cardiovascular: Positive for leg swelling.  GI: Gastrointestinal negative.  Musculoskeletal: Positive for leg pain.  Neurological: Neurological negative. Hematologic: Hematologic/lymphatic negative.  Psychiatric: Psychiatric negative.        Objective:  Objective   Vitals:   01/11/23 0834  BP: 111/76  Pulse: 73  Resp: 20  Temp: 97.9 F (36.6 C)  Weight: 248 lb 4.8 oz (112.6 kg)  Height: 5\' 11"  (1.803 m)   Body mass index is 34.63 kg/m.  Physical Exam HENT:     Head: Normocephalic.     Nose: Nose normal.  Eyes:     Pupils: Pupils are equal, round, and reactive to light.  Cardiovascular:     Rate and Rhythm: Normal rate.  Pulmonary:     Effort: Pulmonary effort is normal.  Abdominal:     General: Abdomen is flat.  Musculoskeletal:     Right lower leg: No edema.     Left lower leg: No edema.  Skin:    Capillary Refill: Capillary refill takes less than 2 seconds.     Comments: Bilateral skin discoloration consistent with C4 venous disease  Neurological:     General: No focal deficit present.     Mental Status: He is alert.  Psychiatric:        Mood and Affect: Mood normal.     Data: IVC/Iliac Findings:  +----------+------+--------+--------+    IVC    PatentThrombusComments  +----------+------+--------+--------+  IVC Prox  patent                  +----------+------+--------+--------+  IVC Mid   patent                  +----------+------+--------+--------+  IVC Distalpatent                  +----------+------+--------+--------+       Left Stent(s):  +---------------+--------+  CIV           Comments  +---------------+--------+  Prox to Stent  patent    +---------------+--------+  Proximal Stent patent    +---------------+--------+  Mid Stent      patent    +---------------+--------+  Distal Stent   patent    +---------------+--------+  Distal to Stentpatent     +---------------+--------+      Summary:  IVC/Iliac: Patent left common iliac vein stent without obvious evidence of  thrombus.      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  He continues on Xarelto and will remain indefinitely given history of factor V Leiden.  Plan will be for yearly follow-up with IVC iliac vein duplex unless he has issues prior.     Maeola Harman MD [facility]

## 2023-01-24 ENCOUNTER — Other Ambulatory Visit: Payer: Self-pay

## 2023-01-24 DIAGNOSIS — I871 Compression of vein: Secondary | ICD-10-CM

## 2023-02-02 ENCOUNTER — Telehealth: Payer: Self-pay

## 2023-02-02 NOTE — Telephone Encounter (Signed)
Patients SO called requesting samples for Xarelto, I advised her that we only have 1 sample-7 tablets available. Patient SO stated the patient has started a new job and wont have insurance until the end of December, patient is scheduled for 10/22 to discuss switching back to Eliquis. The sample has been signed out by Dr.Vincent and is ready for pick up.

## 2023-02-06 NOTE — Telephone Encounter (Signed)
Will f/u after 10/22 appt to see which assistance application (xarelto or eliquis) needs to be sent! Or I can email/send application while patient is in office (feel free to reach out via Teams or secure chat)  Thanks!

## 2023-02-07 ENCOUNTER — Ambulatory Visit (INDEPENDENT_AMBULATORY_CARE_PROVIDER_SITE_OTHER): Payer: Self-pay | Admitting: Student

## 2023-02-07 DIAGNOSIS — D6851 Activated protein C resistance: Secondary | ICD-10-CM

## 2023-02-07 NOTE — Telephone Encounter (Signed)
Patient completed his visit with Dr.Arellano this morning. I have prepared the remaining 2 samples of Xarelto 10 mg -14 tablets for the patients. The samples have been signed out by [facility] and are ready for the patient to pick up. The samples are in the med room.

## 2023-02-07 NOTE — Assessment & Plan Note (Signed)
Patient switched to Xarelto 20 mg daily with food through IM program/[facility]. Reports no issues today, has not experienced further L foot pain or swelling. States he is compliant with Xarelto, no missed doses. No overt bleeding or difficulty stopping bleeds. Continues to follow with Dr. Randie Heinz ([facility]).  Patient would like refill of Xarelto today, has about 3 pills left. 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 Would like assistance to obtain his Xarelto and avoid missed doses.  Plan  - Patient will be given 14 day supply of Xarelto, will need to pick up tomorrow 10/23 - Will message [facility] [place] (pharmacy) to help with next steps  - 1 month follow up, patient is to call [facility] if he is about to run out of Xarelto before then

## 2023-02-07 NOTE — Progress Notes (Signed)
   I connected with  [name] [name] on 02/07/23 by telephone and verified that I am speaking with the correct person using two identifiers.   I discussed the limitations of evaluation and management by telemedicine. The patient expressed understanding and agreed to proceed.  CC: medication refill   This is a telephone encounter between HAYMOND LIPSCHUTZ and Philomena Doheny on 02/07/2023 for medication refill. The visit was conducted with the patient located at  his workplace  and Gaylin Osoria Colbert Coyer at [facility]. The patient's identity was confirmed using their DOB and current address. The patient has consented to being evaluated through a telephone encounter and understands the associated risks (an examination cannot be done and the patient may need to come in for an appointment) / benefits (allows the patient to remain at home, decreasing exposure to coronavirus). I personally spent 15 minutes on medical discussion.   HPI:  Mr.Raymir T Ramaswamy is a 42 y.o. with PMH as below.   Please see A&P for assessment of the patient's acute and chronic medical conditions.   Past Medical History:  Diagnosis Date   Anxiety and depression    DVT (deep venous thrombosis) (HCC)    Pt unsure of date    Factor V Leiden mutation (HCC)    With resultant hypercoaguable state, Hx of DVT, PE, SP IVC filter placement, on chronic coumadin and requires high doses to maintine IRN 2-3.   GERD (gastroesophageal reflux disease)    history of   PE (pulmonary thromboembolism) (HCC)    unsure of date   Pneumonia    Pneumothorax 11/2009   HX of, requiring chest tube/hospitalization.    Pulmonary embolism (HCC) 12/30/2019   Bilateral pulmonary embolisms found on CT/angio chest on 9/13 during hospitalization.  On long-term anticoagulation with Eliquis 5 mg twice daily.   Venous stasis    bilateral LE.   Review of Systems:  negative, please refer to HPI   Assessment & Plan:   Factor V Leiden, prothrombin  gene mutation [facility]) Patient switched to Xarelto 20 mg daily with food through IM program/[facility]. Reports no issues today, has not experienced further L foot pain or swelling. States he is compliant with Xarelto, no missed doses. No overt bleeding or difficulty stopping bleeds. Continues to follow with Dr. Randie Heinz ([facility]).  Patient would like refill of Xarelto today, has about 3 pills left. Has new job with city of [place] but insurance won't kick in until December 1st. Would like assistance to obtain his Xarelto and avoid missed doses.  Plan  - Patient will be given 14 day supply of Xarelto, will need to pick up tomorrow 10/23 - Will message [place] (pharmacy) to help with next steps  - 1 month follow up, patient is to call [facility] if he is about to run out of Xarelto before then    Patient discussed with Dr. Lafonda Mosses, who was able to join the call and help answer patient questions.   Olyver Hawes Colbert Coyer, MD  [facility] Internal Medicine  PGY-1 [facility] Phone: [phone] Date 02/07/2023  Time 3:46 PM

## 2023-02-08 NOTE — Telephone Encounter (Signed)
The samples are still waiting to be picked up.

## 2023-02-09 NOTE — Telephone Encounter (Signed)
Resending patient assistance application for xarelto to pt's home.  Updated on original telephone note 12/22/22

## 2023-02-09 NOTE — Telephone Encounter (Signed)
Pt's SO pick up 2 bottles of Xarelto.

## 2023-02-09 NOTE — Telephone Encounter (Signed)
Will resend updated application to patient for Xarelto assistance.

## 2023-02-17 ENCOUNTER — Telehealth: Payer: Self-pay

## 2023-02-17 NOTE — Telephone Encounter (Signed)
Pt's SO requesting sample on rivaroxaban (XARELTO) 20 MG TABS tablet . States she would like to pick this medication by today. Pt currently out of medication. Please call pt back.

## 2023-02-17 NOTE — Progress Notes (Signed)
Internal Medicine Clinic Attending  Case discussed with the resident at the time of the visit.  We reviewed the resident's history and exam and pertinent patient test results.  I agree with the assessment, diagnosis, and plan of care documented in the resident's note.  

## 2023-02-21 NOTE — Telephone Encounter (Signed)
RTC to patient we have samples of Xarelto 10 mg.  Patient needs 20 mg.  Spokw eith Dr. Mikey Bussing who is willing to give patient the samples of the Xarelto 10 mg.  Spoke with Estanislado [facility] for the Clinics patien may qualify for assistance until his insurance becomes effective.  Will sent message to [facility] about patient.  Patient was unsure if he received forms to complete for samples.  Will check with his "Woman".

## 2023-02-22 ENCOUNTER — Telehealth: Payer: Self-pay | Admitting: *Deleted

## 2023-02-22 NOTE — Telephone Encounter (Signed)
Patient's significant other here to pick up samples for patient.  Encouraged her to fill out the forms for the patient Assistance as soon as possible.  Patient was given Samples of the Xarelto 10 mg .  Patient to take 2 tablets given 14 tablets.  Spoke to [place] who said forms were not received.  Attempted to reach her when [facility] Clin[facility] arrived to return to get forms. Unable to reach her VM was left to return for forms.  Call to patient stated that  [name] [name] was on her way to work and requested that a copy be e-mailed to him and also will will come by this afternoon to pick up a copy of the forms to be completed for assistance.

## 2023-03-01 NOTE — Progress Notes (Unsigned)
Pharmacy Medication Assistance Program Note    03/08/2023  Patient ID: [name], male   DOB: 02/21/1981, 42 y.o.   MRN: [id number]     02/16/2023 03/01/2023 03/08/2023  Outreach Medication One  Initial Outreach Date (Medication One) 12/22/2022    Manufacturer Medication One [facility] Xarelto    Type of Assistance Manufacturer Assistance    Date Application Sent to Patient 02/16/2023    Application Items Requested Application    Date Application Sent to Prescriber  03/01/2023   Date Application Received From Patient  02/27/2023   Application Items Received From Patient  Application   Date Application Received From Provider   03/08/2023  Date Application Submitted to Manufacturer   03/08/2023  Method Application Sent to Manufacturer   Fax       Signature     Pharmacy Medication Assistance Program Note    03/01/2023  Patient ID: [name], male   DOB: [birth year], 42 y.o.   MRN: [id number]     02/16/2023 03/01/2023  Outreach Medication One  Initial Outreach Date (Medication One) 12/22/2022   Manufacturer Medication One [facility] Xarelto   Type of Assistance Manufacturer Assistance   Date Application Sent to Patient 02/16/2023   Application Items Requested Application   Date Application Sent to Prescriber  03/01/2023  Date Application Received From Patient  02/27/2023  Application Items Received From Patient  Application       Application in Dr. Judith Blonder box awaiting signature.

## 2023-03-02 ENCOUNTER — Telehealth: Payer: Self-pay | Admitting: Student

## 2023-03-02 ENCOUNTER — Telehealth: Payer: Self-pay

## 2023-03-02 NOTE — Telephone Encounter (Signed)
Pt's fiance, Mardella Layman, called stating that the pt has run out of Xarelto and he's usually been getting samples until his new insurance takes affect from his PCP, but they are out. She is asking if this office has any.  Reviewed pt's chart, returned call for clarification, two identifiers used. Informed her that we have samples. She is driving up from [place] and will be here asap. She will be given 1 bottle of 10 mg with 7 tablets and 1 bottle of 2.5 mg with 14 tablets for a total of 20 mg for a one week supply. Will go over instructions regarding administration when she gets to the office. Confirmed understanding.

## 2023-03-02 NOTE — Telephone Encounter (Signed)
Patients spouse called regarding samples for Xarelto and for financial assistance application status I  advised patients spouse that we don't have any samples of Xarelto available. I advised her to wait for [place] to give her a call back.

## 2023-03-02 NOTE — Telephone Encounter (Signed)
Rec'd call from [name] the pt (SO) that she sen the required form by email on 02/27/2023 to [employer].  Pt is now completely out of the following medication and missed his dose yesterday.  Please call the back to her as he is in testing today a work and can not take any calls.  rivaroxaban (XARELTO) 20 MG TABS tablet

## 2023-03-07 ENCOUNTER — Other Ambulatory Visit (HOSPITAL_COMMUNITY): Payer: Self-pay

## 2023-03-07 ENCOUNTER — Encounter: Payer: Self-pay | Admitting: Student

## 2023-03-07 ENCOUNTER — Ambulatory Visit (INDEPENDENT_AMBULATORY_CARE_PROVIDER_SITE_OTHER): Payer: Self-pay | Admitting: Student

## 2023-03-07 DIAGNOSIS — I82512 Chronic embolism and thrombosis of left femoral vein: Secondary | ICD-10-CM

## 2023-03-07 DIAGNOSIS — Z7901 Long term (current) use of anticoagulants: Secondary | ICD-10-CM

## 2023-03-07 MED ORDER — RIVAROXABAN 20 MG PO TABS
20.0000 mg | ORAL_TABLET | Freq: Every day | ORAL | 2 refills | Status: DC
  Filled 2023-03-07: qty 30, 30d supply, fill #0

## 2023-03-07 NOTE — Progress Notes (Signed)
  [facility] Internal Medicine Residency Telephone Encounter Continuity Care Appointment  HPI:  This telephone encounter was created for [name] [name] on 03/07/2023 for the following purpose/cc Medication refill and assistance.Patient expressed concerns of running out of the Xarelto samples he was given last month and his insurance hasn't kicked in yet . York Spaniel he has only 2 days worth of pills left and would need another month of supply.Patient has already apply for  medication assistance and is hoping he can get his Xarelto at a discounted price.    Past Medical History:  Past Medical History:  Diagnosis Date   Anxiety and depression    DVT (deep venous thrombosis) (HCC)    Pt unsure of date    Factor V Leiden mutation (HCC)    With resultant hypercoaguable state, Hx of DVT, PE, SP IVC filter placement, on chronic coumadin and requires high doses to maintine IRN 2-3.   GERD (gastroesophageal reflux disease)    history of   PE (pulmonary thromboembolism) (HCC)    unsure of date   Pneumonia    Pneumothorax 11/2009   HX of, requiring chest tube/hospitalization.    Pulmonary embolism (HCC) 12/30/2019   Bilateral pulmonary embolisms found on CT/angio chest on 9/13 during hospitalization.  On long-term anticoagulation with Eliquis 5 mg twice daily.   Venous stasis    bilateral LE.     ROS:  Denies andy calf pain,shortness of breath or any signs of bleeding.   Assessment / Plan / Recommendations:  Please see A&P under problem oriented charting for assessment of the patient's acute and chronic medical conditions.  As always, pt is advised that if symptoms worsen or new symptoms arise, they should go to an urgent care facility or to to ER for further evaluation.   Consent and Medical Decision Making:  Patient  spoken  to with   Dr.  Lafonda Mosses This is a telephone encounter between [name] [name] and [name] [name] on 03/07/2023 for medication assistance. The visit was conducted with the  patient located at home and [name] [name] at [facility]. The patient's identity was confirmed using their DOB and current address. The patient has consented to being evaluated through a telephone encounter and understands the associated risks (an examination cannot be done and the patient may need to come in for an appointment) / benefits (allows the patient to remain at home, decreasing exposure to coronavirus). I personally spent 28 minutes on medical discussion.   Patient received a free one month supply of Xarelto last month.The clinic unfortunately doesn't have  samples of Xarelto at this time. We do however have samples of Eliquis which was what the patient was initially on before being switched to Xarelto. - Change to Eliquis 5mg  BID  - Give 2 weeks samples from the clinic - Follow up in 2 weeks   Discussed with Dr Lafonda Mosses and plan is to switch him back to Eliquis at this time.Patient is advised to pick up 2 boxes of Eliquis from the clinic today or tomorrow.

## 2023-03-07 NOTE — Patient Instructions (Signed)
Thank you, [name] for allowing [place] to provide your care today. Today we discussed your anticoagulation.We will switch you back to Eliquis today. Kindly pick it up from the clinic today or tomorrow.    I have ordered the following labs for you:  Lab Orders  No laboratory test(s) ordered today      Referrals ordered today:   Referral Orders  No referral(s) requested today     I have ordered the following medication/changed the following medications:   Stop the following medications: Medications Discontinued During This Encounter  Medication Reason   rivaroxaban (XARELTO) 20 MG TABS tablet Reorder     Start the following medications: Meds ordered this encounter  Medications   rivaroxaban (XARELTO) 20 MG TABS tablet    Sig: Take 1 tablet (20 mg total) by mouth daily with supper.    Dispense:  30 tablet    Refill:  2    IM program     Follow up:  1 Month    Remember: Please call us if you are bleeding or need your anticoagulation. It is very important tha  Should you have any questions or concerns please call the internal medicine clinic at [phone].    [name] [name], M.D [facility] Center

## 2023-03-08 ENCOUNTER — Other Ambulatory Visit (HOSPITAL_COMMUNITY): Payer: Self-pay

## 2023-03-08 MED ORDER — APIXABAN 5 MG PO TABS
5.0000 mg | ORAL_TABLET | Freq: Two times a day (BID) | ORAL | 11 refills | Status: DC
  Filled 2023-03-08 (×2): qty 60, 30d supply, fill #0

## 2023-03-08 NOTE — Progress Notes (Signed)
Internal Medicine Clinic Attending  I was physically present during the key portions of the resident provided service and participated in the medical decision making of patient's management care. I reviewed pertinent patient test results.  The assessment, diagnosis, and plan were formulated together and I agree with the documentation in the resident's note.  Mercie Eon, MD     Change to Eliquis 5mg  BID and provide him with 2 weeks of samples from our clinic.  Starting December 1, he will have health insurance where Eliquis will be covered.

## 2023-03-08 NOTE — Addendum Note (Signed)
Addended by: Kathleen Lime on: 03/08/2023 11:09 AM   Modules accepted: Orders

## 2023-03-09 ENCOUNTER — Telehealth: Payer: Self-pay

## 2023-03-09 NOTE — Telephone Encounter (Signed)
Pt's significant other called stating that the pt needed more samples if this office had any.  Reviewed pt's chart, returned call for clarification, two identifiers used. Informed her that she could come by and pick up some samples. Enough was given to last through the end of this month. Pt's insurance goes into effect on 03/19/23. Confirmed understanding.

## 2023-03-15 ENCOUNTER — Telehealth: Payer: Self-pay | Admitting: *Deleted

## 2023-03-15 NOTE — Telephone Encounter (Signed)
Patient her to pick up the samples of thee 2.5 mg Eliquis.  Patient was given 4 boxes of the 2.5.mg Eliquis 28 tablets.  Patient stated will be getting insurance on December 1,2024.

## 2023-03-22 ENCOUNTER — Ambulatory Visit (INDEPENDENT_AMBULATORY_CARE_PROVIDER_SITE_OTHER): Payer: Self-pay | Admitting: Student

## 2023-03-22 DIAGNOSIS — Z87891 Personal history of nicotine dependence: Secondary | ICD-10-CM

## 2023-03-22 NOTE — Progress Notes (Deleted)
   CC: smoking cessation  This is a telephone encounter between [name] [name] and [name] [name] on 03/22/2023 for smoking cessation discussion. The visit was conducted with the patient located at home and [name] [name] at [facility]. The patient's identity was confirmed using their DOB and current address. The patient has consented to being evaluated through a telephone encounter and understands the associated risks (an examination cannot be done and the patient may need to come in for an appointment) / benefits (allows the patient to remain at home, decreasing exposure to coronavirus). I personally spent {Numbers; [id number]} minutes on medical discussion.   HPI:  [name] [name] is a 42 y.o. with PMH as below.   Please see A&P for assessment of the patient's acute and chronic medical conditions.   Past Medical History:  Diagnosis Date   Anxiety and depression    DVT (deep venous thrombosis) (HCC)    Pt unsure of date    Factor V Leiden mutation (HCC)    With resultant hypercoaguable state, Hx of DVT, PE, SP IVC filter placement, on chronic coumadin and requires high doses to maintine IRN 2-3.   GERD (gastroesophageal reflux disease)    history of   PE (pulmonary thromboembolism) (HCC)    unsure of date   Pneumonia    Pneumothorax 11/2009   HX of, requiring chest tube/hospitalization.    Pulmonary embolism (HCC) 12/30/2019   Bilateral pulmonary embolisms found on CT/angio chest on 9/13 during hospitalization.  On long-term anticoagulation with Eliquis 5 mg twice daily.   Venous stasis    bilateral LE.   Review of Systems:  ***  Assessment & Plan:   No problem-specific Assessment & Plan notes found for this encounter.   Patient {GC/GE:[id number]::"discussed with","seen with"} Dr. {[id number]::"WJXBJY","NWG","N. Hoffman","Williams","Mullen","Narendra","Guilloud","Vincent"}  [name] Fabian, MD Internal Medicine Resident

## 2023-03-22 NOTE — Progress Notes (Signed)
Attempted to call patient 3 times for scheduled telehealth visit and went to voicemail.  He will need to reschedule for another appointment slot when he is available.

## 2023-03-24 ENCOUNTER — Encounter: Payer: Self-pay | Admitting: Student

## 2023-03-24 ENCOUNTER — Ambulatory Visit (INDEPENDENT_AMBULATORY_CARE_PROVIDER_SITE_OTHER): Payer: 59 | Admitting: Student

## 2023-03-24 DIAGNOSIS — I82512 Chronic embolism and thrombosis of left femoral vein: Secondary | ICD-10-CM | POA: Diagnosis not present

## 2023-03-24 DIAGNOSIS — Z7901 Long term (current) use of anticoagulants: Secondary | ICD-10-CM | POA: Diagnosis not present

## 2023-03-24 DIAGNOSIS — F17210 Nicotine dependence, cigarettes, uncomplicated: Secondary | ICD-10-CM

## 2023-03-24 DIAGNOSIS — Z87891 Personal history of nicotine dependence: Secondary | ICD-10-CM

## 2023-03-24 MED ORDER — NICOTINE POLACRILEX 4 MG MT GUM: 4.0000 mg | CHEWING_GUM | OROMUCOSAL | 0 refills | Status: AC | PRN

## 2023-03-24 MED ORDER — VARENICLINE TARTRATE 1 MG PO TABS: 1.0000 mg | ORAL_TABLET | Freq: Two times a day (BID) | ORAL | 2 refills | Status: AC

## 2023-03-24 MED ORDER — APIXABAN 5 MG PO TABS: 5.0000 mg | ORAL_TABLET | Freq: Two times a day (BID) | ORAL | 11 refills | Status: DC

## 2023-03-24 MED ORDER — VARENICLINE TARTRATE 0.5 MG PO TABS: 0.5000 mg | ORAL_TABLET | ORAL | 0 refills | Status: DC

## 2023-03-24 NOTE — Assessment & Plan Note (Signed)
Patient has a history of deep vein thrombosis and is taking apixaban 5 mg twice daily for prophylaxis.  I have sent in a refill of this medication to his pharmacy.

## 2023-03-24 NOTE — Progress Notes (Signed)
   CC: smoking cessation  This is a telephone encounter between [name] and Annett Fabian on 03/24/2023 for smoking cessation. The visit was conducted with the patient located at home and Annett Fabian at [facility]. The patient's identity was confirmed using their DOB and current address. The patient has consented to being evaluated through a telephone encounter and understands the associated risks (an examination cannot be done and the patient may need to come in for an appointment) / benefits (allows the patient to remain at home, decreasing exposure to coronavirus). I personally spent 15 minutes on medical discussion.   HPI:  [name] is a 42 y.o. with PMH as below.   Please see A&P for assessment of the patient's acute and chronic medical conditions.   Past Medical History:  Diagnosis Date   Anxiety and depression    DVT (deep venous thrombosis) (HCC)    Pt unsure of date    Factor V Leiden mutation (HCC)    With resultant hypercoaguable state, Hx of DVT, PE, SP IVC filter placement, on chronic coumadin and requires high doses to maintine IRN 2-3.   GERD (gastroesophageal reflux disease)    history of   PE (pulmonary thromboembolism) (HCC)    unsure of date   Pneumonia    Pneumothorax 11/2009   HX of, requiring chest tube/hospitalization.    Pulmonary embolism (HCC) 12/30/2019   Bilateral pulmonary embolisms found on CT/angio chest on 9/13 during hospitalization.  On long-term anticoagulation with Eliquis 5 mg twice daily.   Venous stasis    bilateral LE.   Review of Systems:  Negative except as noted in the problem-based assessment and plan.   Assessment & Plan:   Quit smoking Patient has a long history of tobacco use.  He states he cannot remember a time when he did not smoke.  He estimated that he has been smoking at least 1 pack/day for the past 20 years or so.  He has tried several times to cut back without success.  Given his history of blood clotting,  multiple doctors have told him the importance of quitting smoking, so he would like medication assistance with this.  I spoke to him about Chantix and he would like to start it today.  I am also prescribing nicotine gum as an adjunct.  We will follow-up with a telehealth visit in 4 weeks to see how he is doing with his tobacco cessation.  Dvt femoral (deep venous thrombosis) (HCC) Patient has a history of deep vein thrombosis and is taking apixaban 5 mg twice daily for prophylaxis.  I have sent in a refill of this medication to his pharmacy.  Patient discussed with Dr. Sharilyn Sites, MD Internal Medicine Resident

## 2023-03-24 NOTE — Assessment & Plan Note (Addendum)
Patient has a long history of tobacco use.  He states he cannot remember a time when he did not smoke.  He estimated that he has been smoking at least 1 pack/day for the past 20 years or so.  He has tried several times to cut back without success.  Given his history of blood clotting, multiple doctors have told him the importance of quitting smoking, so he would like medication assistance with this.  I spoke to him about Chantix and he would like to start it today.  I am also prescribing nicotine gum as an adjunct.  We will follow-up with a telehealth visit in 4 weeks to see how he is doing with his tobacco cessation.

## 2023-03-28 NOTE — Progress Notes (Signed)
Internal Medicine Clinic Attending  Case discussed with the resident at the time of the visit.  We reviewed the resident's history and exam and pertinent patient test results.  I agree with the assessment, diagnosis, and plan of care documented in the resident's note.  

## 2023-09-19 LAB — AMB RESULTS CONSOLE CBG: Glucose: 150

## 2023-09-19 NOTE — Progress Notes (Unsigned)
 Pt was seen on 09/19/2023 at health screening event. Pt had a BP of 145/96 and a blood sugar of 150. Pt indicated that he does have a PCP and insurance. Pt declined to fill out SDOH form and was not given any follow up instruction from the CMA.

## 2024-01-04 NOTE — Progress Notes (Signed)
 Pt attended 09/19/2023 screening event with BP of 145/96 and blood sugar was 150. At event pt did not indicate any SDOH needs. Pt also noted that he is a smoker and listed Medicaid as his insurance at the event.   Per initial f/u pt was reached out via phone and pt stated that he has a PCP but his partner is the one who keeps up with that information. Pt could not remember name of PC at [facility], but that he has been seen recently and denied any further assistance from [facility]. Pt was mailed curtesy letter with BP management flyer, BP log book, smoking cessation flyer, & [employer] flyer.  Per chart review pt does have a PCP (current PCP has not been updated), insurance, and is a smoker. Pt's appts with PCP are not CHL-visible. Pt does not indicate any SDOH needs at this time.  No additional pt f/u to be scheduled at this time per health equity protocol.

## 2024-02-02 ENCOUNTER — Other Ambulatory Visit: Payer: Self-pay

## 2024-02-02 ENCOUNTER — Ambulatory Visit: Payer: Self-pay

## 2024-02-02 VITALS — BP 129/84 | HR 81 | Temp 98.0°F | Ht 71.0 in | Wt 277.4 lb

## 2024-02-02 DIAGNOSIS — Z131 Encounter for screening for diabetes mellitus: Secondary | ICD-10-CM | POA: Diagnosis not present

## 2024-02-02 DIAGNOSIS — Z0000 Encounter for general adult medical examination without abnormal findings: Secondary | ICD-10-CM

## 2024-02-02 DIAGNOSIS — R102 Pelvic and perineal pain unspecified side: Secondary | ICD-10-CM | POA: Diagnosis not present

## 2024-02-02 NOTE — Progress Notes (Unsigned)
   Acute Office Visit  Subjective:     Patient ID: [name] [name], male    DOB: Jan 08, 1981, 43 y.o.   MRN: [id number]  Chief Complaint  Patient presents with   Acute Visit    Knot behind  testicles ... Pt reports that  he noticed the  knot  about a month ....   [name] [name] is a 43 year old male with past medical history of factor V Leiden deficiency who presents today for evaluation of a knot located under his testicle.  Please see problem-based assessment and plan below   Review of Systems  Constitutional:  Negative for chills and fever.  Gastrointestinal:  Negative for abdominal pain, nausea and vomiting.  Genitourinary:  Negative for dysuria, flank pain, frequency, hematuria and urgency.        Objective:    BP 129/84 (BP Location: Right Arm, Patient Position: Sitting, Cuff Size: Large)   Pulse 81   Temp 98 F (36.7 C) (Oral)   Ht 5' 11 (1.803 m)   Wt 277 lb 6.4 oz (125.8 kg)   SpO2 97%   BMI 38.69 kg/m     Const: Awake, alert in NAD HENT: Normocephalic, atraumatic, mucus membranes moist Card: RRR, No MRG, No pitting edema on LE's bilaterally  Resp: LCTAB, no increased work of breathing Abd: Soft, NTND, Bsx4 Extremities: Warm, pink GU: No visible or palpable mass visible upon examination. Chaperone was present.    No results found for any visits on 02/02/24.      Assessment & Plan:   Assessment & Plan Pain in male perineum The patient reported that approximately a month ago, he was getting down from a dump truck and felt a pulling pain in his groin. Later that night in the shower, he noticed a lump behind his testicle in his perineal area. He denies dysuria, hesitancy, discharge, hematuria, fevers or chills, or noticeable lymphadenopathy He reports that it is not painful and has not been changing in size. However, today, it is not present. He noted that today is the first day that he hasn't noticed it there. Upon exam, when asking where the lesion was, he  reported that it was in the posterior aspect of his scrotum. With the mechanism of injury and the resolution, suspect this may have been a small, indirect inguinal hernia. The patient was instructed that if it returns and/or begins causing issues or pain, to call the office and we can refer him to general surgery for evaluation.       Schuyler Novak, DO

## 2024-02-03 LAB — BASIC METABOLIC PANEL WITH GFR
BUN/Creatinine Ratio: 13 (ref 9–20)
BUN: 11 mg/dL (ref 6–24)
CO2: 21 mmol/L (ref 20–29)
Calcium: 9.6 mg/dL (ref 8.7–10.2)
Chloride: 102 mmol/L (ref 96–106)
Creatinine, Ser: 0.88 mg/dL (ref 0.76–1.27)
Glucose: 93 mg/dL (ref 70–99)
Potassium: 4.5 mmol/L (ref 3.5–5.2)
Sodium: 137 mmol/L (ref 134–144)
eGFR: 108 mL/min/1.73 (ref >59)

## 2024-02-03 LAB — LIPID PANEL
Chol/HDL Ratio: 5.5 ratio — ABNORMAL HIGH (ref 0.0–5.0)
Cholesterol, Total: 241 mg/dL — ABNORMAL HIGH (ref 100–199)
HDL: 44 mg/dL (ref >39)
LDL Chol Calc (NIH): 176 mg/dL — ABNORMAL HIGH (ref 0–99)
Triglycerides: 117 mg/dL (ref 0–149)
VLDL Cholesterol Cal: 21 mg/dL (ref 5–40)

## 2024-02-03 LAB — HEMOGLOBIN A1C
Est. average glucose Bld gHb Est-mCnc: 120 mg/dL
Hgb A1c MFr Bld: 5.8 % — ABNORMAL HIGH (ref 4.8–5.6)

## 2024-02-06 NOTE — Progress Notes (Signed)
 Internal Medicine Clinic Attending  I was physically present during the key portions of the resident provided service and participated in the medical decision making of patient's management care. I reviewed pertinent patient test results.  The assessment, diagnosis, and plan were formulated together and I agree with the documentation in the resident's note.  Francesco Elsie NOVAK, MD

## 2024-02-08 ENCOUNTER — Ambulatory Visit: Payer: Self-pay | Admitting: Student

## 2024-02-09 ENCOUNTER — Ambulatory Visit: Admitting: Student

## 2024-02-09 VITALS — BP 121/84 | HR 80 | Temp 98.2°F | Ht 71.0 in | Wt 280.6 lb

## 2024-02-09 DIAGNOSIS — F17210 Nicotine dependence, cigarettes, uncomplicated: Secondary | ICD-10-CM

## 2024-02-09 DIAGNOSIS — Z1211 Encounter for screening for malignant neoplasm of colon: Secondary | ICD-10-CM

## 2024-02-09 DIAGNOSIS — K219 Gastro-esophageal reflux disease without esophagitis: Secondary | ICD-10-CM

## 2024-02-09 DIAGNOSIS — G4733 Obstructive sleep apnea (adult) (pediatric): Secondary | ICD-10-CM

## 2024-02-09 DIAGNOSIS — Z87891 Personal history of nicotine dependence: Secondary | ICD-10-CM

## 2024-02-09 MED ORDER — PANTOPRAZOLE SODIUM 40 MG PO TBEC: 40.0000 mg | DELAYED_RELEASE_TABLET | Freq: Every day | ORAL | 1 refills | Status: AC

## 2024-02-09 NOTE — Progress Notes (Signed)
 CC: FU on chronic medical conditions  HPI:  [name] is a 43 y.o. male living with a history stated below and presents today for FU on chronic medical conditions.   Patient was last seen in resident [facility] on 10/23 for a pain in the perineum, which he says it has since resolved.   He presents today due to having a sudden onset choking/vomiting after eating spicy food about 2 to 3 days ago.  He endorses heartburn,/sensation of regurgitation after eating.   Please see problem based assessment and plan for additional details.  Past Medical History:  Diagnosis Date   Anxiety and depression    DVT (deep venous thrombosis) (HCC)    Pt unsure of date    Factor V Leiden mutation    With resultant hypercoaguable state, Hx of DVT, PE, SP IVC filter placement, on chronic coumadin  and requires high doses to maintine IRN 2-3.   GERD (gastroesophageal reflux disease)    history of   PE (pulmonary thromboembolism) (HCC)    unsure of date   Pneumonia    Pneumothorax 11/2009   HX of, requiring chest tube/hospitalization.    Pulmonary embolism (HCC) 12/30/2019   Bilateral pulmonary embolisms found on CT/angio chest on 9/13 during hospitalization.  On long-term anticoagulation with Eliquis  5 mg twice daily.   Venous stasis    bilateral LE.    Current Outpatient Medications on File Prior to Visit  Medication Sig Dispense Refill   acetaminophen  (TYLENOL ) 325 MG tablet Take 2 tablets (650 mg total) by mouth every 6 (six) hours as needed for mild pain (or temp > 100).     apixaban  (ELIQUIS ) 5 MG TABS tablet Take 1 tablet (5 mg total) by mouth 2 (two) times daily. 60 tablet 11   nicotine  polacrilex (NICORETTE ) 4 MG gum Take 1 each (4 mg total) by mouth as needed for smoking cessation. 100 tablet 0   [DISCONTINUED] enoxaparin  (LOVENOX ) 120 MG/0.8ML injection Inject 0.76 mLs (115 mg total) into the skin every 12 (twelve) hours. 30 Syringe 0   [DISCONTINUED] warfarin (COUMADIN ) 10 MG tablet  Take 2 tablets (20 mg total) by mouth daily. 60 tablet 0   No current facility-administered medications on file prior to visit.    Review of Systems: ROS negative except for what is noted on the assessment and plan.  Vitals:   02/09/24 1106  BP: 121/84  Pulse: 80  Temp: 98.2 F (36.8 C)  TempSrc: Oral  SpO2: 94%  Weight: 280 lb 9.6 oz (127.3 kg)  Height: 5' 11 (1.803 m)      Physical Exam: Constitutional: NAD Cardiovascular: RRR, no murmurs. Pulmonary/Chest: Clear bilateral lungs Abdominal: soft, non-tender, non-distended.  Assessment & Plan:   Patient discussed with Dr. Trudy  Assessment & Plan Gastroesophageal reflux disease, unspecified whether esophagitis present Has Epigastric discomfort with heartburn, worse after meals and when lying down, consistent with gastroesophageal reflux disease (GERD). Intermittently uses Pepto-Bismol with minimal relief.  Denies alarm symptoms such as dysphagia or odynophagia.  Plan: - Start pantoprazole  40 mg daily for 4-8 weeks. - Counsel on lifestyle modifications: elevate head of bed, avoid lying down within 2-3 hours after meals, reduce intake of trigger foods (spicy, acidic, caffeinated, or fatty), and encourage weight reduction as appropriate. Follow-up: Reassess symptoms after 8 weeks; if persistent or worsening, consider referral to GI for upper endoscopy. Quit smoking Current cigarette smoker, quit about 30-pack-year history, previously tried Chantix  unable to tolerate due to nightmares.  He is interested  in trying the nicotine  patches. - nicotine  patch 21 mg for 6 weeks, followed by 14 mg for 2 weeks, and 7 mg for 2 weeks. - Could consider adding Bupropion at next OV.  OSA (obstructive sleep apnea) Endorses morning tiredness, snoring, he has risk factors for OSA with a STOP-BANG score of intermediate risk. -Placed a referral for sleep study. Colon cancer screening Refer to GI for colonoscopy  Orders Placed This  Encounter  Procedures   Ambulatory referral to Gastroenterology   Nocturnal polysomnography (NPSG)    Missy Sandhoff, MD [facility] Internal Medicine, PGY-2  Date 02/11/2024 Time 8:29 AM

## 2024-02-09 NOTE — Patient Instructions (Addendum)
 It was a pleasure taking care of you today!    1.  I have placed a referral for colonoscopy and a sleep study.  2.  I will send in nicotine  patches to the pharmacy for smoking cessation  3.  For acid reflux, take 1 tablet of pantoprazole  40 mg daily. - Avoid trigger foods: spicy, fried, citrus, tomato, chocolate, caffeine, alcohol, and mint. - Eat smaller, more frequent meals; avoid lying down for 2-3 hours after eating. - Elevate the head of your bed 6-8 inches at night. - Maintain a healthy weight; avoid tight clothing around your waist. - Stop smoking and limit alcohol.  I have ordered the following labs for you:  Lab Orders  No laboratory test(s) ordered today      Follow up: 3 months   Should you have any questions or concerns please call the internal medicine clinic at [phone].     Missy Sandhoff, MD  [facility]

## 2024-02-11 DIAGNOSIS — G4733 Obstructive sleep apnea (adult) (pediatric): Secondary | ICD-10-CM | POA: Insufficient documentation

## 2024-02-11 DIAGNOSIS — K219 Gastro-esophageal reflux disease without esophagitis: Secondary | ICD-10-CM | POA: Insufficient documentation

## 2024-02-11 MED ORDER — NICOTINE 14 MG/24HR TD PT24: MEDICATED_PATCH | TRANSDERMAL | 0 refills | Status: AC

## 2024-02-11 MED ORDER — NICOTINE 21 MG/24HR TD PT24: MEDICATED_PATCH | TRANSDERMAL | 0 refills | Status: AC

## 2024-02-11 MED ORDER — NICOTINE 7 MG/24HR TD PT24: MEDICATED_PATCH | TRANSDERMAL | 0 refills | Status: AC

## 2024-02-11 NOTE — Assessment & Plan Note (Signed)
Refer to GI for colonoscopy.

## 2024-02-11 NOTE — Assessment & Plan Note (Addendum)
 Current cigarette smoker, quit about 30-pack-year history, previously tried Chantix  unable to tolerate due to nightmares.  He is interested in trying the nicotine  patches. - nicotine  patch 21 mg for 6 weeks, followed by 14 mg for 2 weeks, and 7 mg for 2 weeks. - Could consider adding Bupropion at next OV.

## 2024-02-11 NOTE — Assessment & Plan Note (Signed)
 Has Epigastric discomfort with heartburn, worse after meals and when lying down, consistent with gastroesophageal reflux disease (GERD). Intermittently uses Pepto-Bismol with minimal relief.  Denies alarm symptoms such as dysphagia or odynophagia.  Plan: - Start pantoprazole  40 mg daily for 4-8 weeks. - Counsel on lifestyle modifications: elevate head of bed, avoid lying down within 2-3 hours after meals, reduce intake of trigger foods (spicy, acidic, caffeinated, or fatty), and encourage weight reduction as appropriate. Follow-up: Reassess symptoms after 8 weeks; if persistent or worsening, consider referral to GI for upper endoscopy.

## 2024-02-11 NOTE — Assessment & Plan Note (Signed)
 Endorses morning tiredness, snoring, he has risk factors for OSA with a STOP-BANG score of intermediate risk. -Placed a referral for sleep study.

## 2024-02-12 ENCOUNTER — Other Ambulatory Visit: Payer: Self-pay

## 2024-02-12 DIAGNOSIS — I82419 Acute embolism and thrombosis of unspecified femoral vein: Secondary | ICD-10-CM

## 2024-02-21 NOTE — Progress Notes (Signed)
 Internal Medicine Clinic Attending  Case discussed with the resident at the time of the visit.  We reviewed the resident's history and exam and pertinent patient test results.  I agree with the assessment, diagnosis, and plan of care documented in the resident's note.

## 2024-03-13 ENCOUNTER — Ambulatory Visit: Admitting: Vascular Surgery

## 2024-03-13 ENCOUNTER — Encounter: Payer: Self-pay | Admitting: Vascular Surgery

## 2024-03-13 ENCOUNTER — Ambulatory Visit (HOSPITAL_COMMUNITY)
Admission: RE | Admit: 2024-03-13 | Discharge: 2024-03-13 | Disposition: A | Discharge status: Home / Self Care | Source: Ambulatory Visit | Attending: Vascular Surgery | Admitting: Vascular Surgery

## 2024-03-13 VITALS — BP 130/88 | HR 98 | Temp 98.3°F | Ht 71.0 in | Wt 286.0 lb

## 2024-03-13 DIAGNOSIS — I82419 Acute embolism and thrombosis of unspecified femoral vein: Secondary | ICD-10-CM | POA: Insufficient documentation

## 2024-03-13 NOTE — Progress Notes (Signed)
 Patient ID: [name] [name], male   DOB: 29-Sep-1980, 43 y.o.   MRN: [id number]  Reason for Consult: No chief complaint on file.   Referred by Renne Homans, MD  Subjective:     HPI:  [name] [name] is a 43 y.o. 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  Patient is religiously taking Xarelto .  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  He is currently upbeat about his situation with his job and home life.  He has skin changes of the left lower extremity but really does not have any complaints of his lower extremities at this time.  Past Medical History:  Diagnosis Date   Anxiety and depression    DVT (deep venous thrombosis) (HCC)    Pt unsure of date    Factor V Leiden mutation    With resultant hypercoaguable state, Hx of DVT, PE, SP IVC filter placement, on chronic coumadin  and requires high doses to maintine IRN 2-3.   GERD (gastroesophageal reflux disease)    history of   PE (pulmonary thromboembolism) (HCC)    unsure of date   Pneumonia    Pneumothorax 11/2009   HX of, requiring chest tube/hospitalization.    Pulmonary embolism (HCC) 12/30/2019   Bilateral pulmonary embolisms found on CT/angio chest on 9/13 during hospitalization.  On long-term anticoagulation with Eliquis  5 mg twice daily.   Venous stasis    bilateral LE.   No family history on file. Past Surgical History:  Procedure Laterality Date   ANGIOPLASTY ILLIAC ARTERY Right 07/11/2016   Procedure: balloon ANGIOPLASTY of Inferior vena cava, right external iliac vein, right common femoral vein;  Surgeon: Penne Lonni Colorado, MD;  Location: [facility]  OR;  Service: Vascular;  Laterality: Right;   CORONARY ULTRASOUND/IVUS Right 07/11/2016   Procedure: INTRAVASCULAR ULTRASOUND/IVUS of right popliteal vein, right common femoral vein, right femoral vein, right exterternal iliac vein, and right internal iliac vein;  Surgeon: Penne Lonni [place], MD;  Location: [facility] OR;  Service: Vascular;  Laterality: Right;   LOWER EXTREMITY ANGIOGRAPHY N/A 07/12/2016   Procedure: LYSIS RECHECK;  Surgeon: Gaile LELON New, MD;  Location: [facility] INVASIVE CV LAB;  Service: Cardiovascular;  Laterality: N/A;   LOWER EXTREMITY VENOGRAPHY N/A 04/13/2020   Procedure: LOWER EXTREMITY VENOGRAPHY - Left Leg;  Surgeon: Colorado Penne Lonni, MD;  Location[facility] INVASIVE CV LAB;  Service: Cardiovascular;  Laterality: N/A;   placement of IVC filter  8/09   Placement of large-bore right chest tube  8/11   right minithoracotomy with evacuation of hematoma  8/09   TONSILLECTOMY     removed as a child   ULTRASOUND GUIDANCE FOR VASCULAR ACCESS Right 07/11/2016   Procedure: ULTRASOUND GUIDANCE FOR VASCULAR ACCESS;  Surgeon: Penne Lonni [place], MD;  Location: [facility] OR;  Service: Vascular;  Laterality: Right;   VENOGRAM Left 07/02/2020   Procedure: LEFT LOWER EXTREMITY VENOGRAM WITH STENT PLACEMENT;  Surgeon: [place] Penne Lonni, MD;  Location: [facility] OR;  Service: Vascular;  Laterality: Left;    Short Social History:  Social History   Tobacco Use   Smoking status: Every Day    Current packs/day: 0.50  Average packs/day: 0.5 packs/day for 20.0 years (10.0 ttl pk-yrs)    Types: Cigarettes    Start date: 07/07/2016   Smokeless tobacco: Former   Tobacco comments:    1/2 pk per day   Substance Use Topics   Alcohol use: No    No Known Allergies  Current Outpatient Medications  Medication Sig Dispense Refill   acetaminophen  (TYLENOL ) 325 MG tablet Take 2 tablets (650 mg total) by mouth every 6 (six) hours as needed for mild pain (or temp > 100).     apixaban  (ELIQUIS ) 5  MG TABS tablet Take 1 tablet (5 mg total) by mouth 2 (two) times daily. 60 tablet 11   nicotine  (NICODERM CQ  - DOSED IN MG/24 HOURS) 14 mg/24hr patch RX #2 Weeks 5-6: 14 mg x 1 patch daily. Wear for 24 hours. If you have sleep disturbances, remove at bedtime. 14 patch 0   nicotine  (NICODERM CQ  - DOSED IN MG/24 HOURS) 21 mg/24hr patch RX #1 Weeks 1-4: 21 mg x 1 patch daily. Wear for 24 hours. If you have sleep disturbances, remove at bedtime. 42 patch 0   nicotine  (NICODERM CQ  - DOSED IN MG/24 HR) 7 mg/24hr patch RX #3 Weeks 7-8: 7 mg x 1 patch daily. Wear for 24 hours. If you have sleep disturbances, remove at bedtime. 14 patch 0   nicotine  polacrilex (NICORETTE ) 4 MG gum Take 1 each (4 mg total) by mouth as needed for smoking cessation. 100 tablet 0   pantoprazole  (PROTONIX ) 40 MG tablet Take 1 tablet (40 mg total) by mouth daily. 30 tablet 1   No current facility-administered medications for this visit.    Review of Systems  Constitutional:  Constitutional negative. HENT: HENT negative.  Eyes: Eyes negative.  Respiratory: Respiratory negative.  Cardiovascular: Negative for leg swelling.  GI: Gastrointestinal negative.  Musculoskeletal: Negative for leg pain.  Neurological: Neurological negative. Hematologic: Hematologic/lymphatic negative.  Psychiatric: Psychiatric negative.        Objective:  Objective  Vitals:   03/13/24 0829  BP: 130/88  Pulse: 98  Temp: 98.3 F (36.8 C)  SpO2: 93%      Physical Exam HENT:     Head: Normocephalic.     Nose: Nose normal.  Eyes:     Pupils: Pupils are equal, round, and reactive to light.  Cardiovascular:     Rate and Rhythm: Normal rate.  Pulmonary:     Effort: Pulmonary effort is normal.  Abdominal:     General: Abdomen is flat.  Musculoskeletal:        General: Normal range of motion.     Cervical back: Normal range of motion.     Right lower leg: No edema.     Left lower leg: No edema.     Comments: Right leg measures 44  cm, left leg 45 cm (measured 10 cm from tibial tuberosity)  Skin:    General: Skin is warm.     Capillary Refill: Capillary refill takes less than 2 seconds.  Neurological:     General: No focal deficit present.     Mental Status: He is alert.  Psychiatric:        Mood and Affect: Mood normal.     Data: IVC/Iliac Findings:  +----------+------+--------+--------+    IVC    PatentThrombusComments  +----------+------+--------+--------+  IVC Prox  patent                  +----------+------+--------+--------+  IVC Mid   patent                  +----------+------+--------+--------+  IVC Distalpatent                  +----------+------+--------+--------+     +-------------------+---------+-----------+---------+-----------+--------+         CIV        RT-PatentRT-ThrombusLT-PatentLT-ThrombusComments  +-------------------+---------+-----------+---------+-----------+--------+  Common Iliac Prox   patent              patent                       +-------------------+---------+-----------+---------+-----------+--------+  Common Iliac Mid    patent              patent                       +-------------------+---------+-----------+---------+-----------+--------+  Common Iliac Distal patent              patent                       +-------------------+---------+-----------+---------+-----------+--------+      +-------------------------+---------+-----------+---------+-----------+----  ----+            EIV            RT-PatentRT-ThrombusLT-PatentLT-ThrombusComments  +-------------------------+---------+-----------+---------+-----------+----  ----+  External Iliac Vein Prox  patent              patent                        +-------------------------+---------+-----------+---------+-----------+----  ----+  External Iliac Vein Mid   patent              patent                         +-------------------------+---------+-----------+---------+-----------+----  ----+  External Iliac Vein       patent              patent                        Distal                                                                      +-------------------------+---------+-----------+---------+-----------+----  ----+           Summary:  IVC/Iliac: Patent IVC and bilateral CIV and EIV.      Assessment/Plan:     43 year old male with extensive venous history as above.  He remains on Xarelto  and is not having issues obtaining his medications.  He continues to wear compression stockings overall appears that he is doing very well.  I will have him follow-up in 1 year with repeat noninvasive studies.     Penne Lonni Colorado MD [facility]

## 2024-04-01 ENCOUNTER — Ambulatory Visit: Payer: Self-pay

## 2024-04-01 ENCOUNTER — Other Ambulatory Visit: Payer: Self-pay | Admitting: Student

## 2024-04-01 ENCOUNTER — Other Ambulatory Visit: Payer: Self-pay

## 2024-04-01 ENCOUNTER — Encounter: Payer: Self-pay | Admitting: Student

## 2024-04-01 MED ORDER — APIXABAN 5 MG PO TABS: 5.0000 mg | ORAL_TABLET | Freq: Two times a day (BID) | ORAL | 11 refills | Status: AC

## 2024-04-01 MED ORDER — APIXABAN 5 MG PO TABS: 5.0000 mg | ORAL_TABLET | Freq: Two times a day (BID) | ORAL | 11 refills | Status: DC

## 2024-04-01 NOTE — Telephone Encounter (Signed)
 Copied from CRM [id number]. Topic: Clinical - Prescription Issue >> Apr 01, 2024 10:48 AM Marda MATSU wrote: Reason for CRM: Medicatiion:  apixaban  (ELIQUIS ) 5 MG TABS tablet  ...   Medication is not at pharmacy per Ms. Swink spoke with pharmacy this morning   [facility] [address] [facility], [place] - [address] [facility] [place] [address] Phone: [phone] Fax: [phone] Hours: Not open 24 hours  *patient took his morning dose but does not have an evening dose.   Please advise

## 2024-04-01 NOTE — Telephone Encounter (Addendum)
 Called on call and sent Epic note to provider. Medication is now available to pick up at correct pharmacy and wrong pharmacy has been removed from chart.       FYI Only or Action Required?: Action required by provider: medication refill request. Eliquis   Patient was last seen in primary care on 02/09/2024 by Celestina Czar, MD.  Called Nurse Triage reporting Medication Refill.  Symptoms began today.  Interventions attempted: Nothing.  Symptoms are: na.  Triage Disposition: No disposition on file.  Patient/caregiver understands and will follow disposition?: yes                      Summary: Completely out of Eliquis    Reason for Triage: Patients girlfriend is calling in due to receiving a message that the medication went to the wrong [facility] again. Patient would like it to go to the  [facility] [address] [facility], [place] - [address] [facility] [place] [address] Phone: [phone] Fax: [phone] Hours: Not open 24 hours  Patients is completely out of his Eliquis  for tonight.       Reason for Disposition  [1] Prescription refill request for ESSENTIAL medicine (i.e., likelihood of harm to patient if not taken) AND [2] triager unable to refill per department policy  Answer Assessment - Initial Assessment Questions 1. DRUG NAME: What medicine do you need to have refilled?     Eliquis  2. REFILLS REMAINING: How many refills are remaining? Notes: The label on the medicine or pill bottle will show how many refills are remaining. If there are no refills remaining, then a renewal may be needed.     None  - Pt is out of medication - Rx was sent to wrong pharmacy - med needs to be called in  Protocols used: Medication Refill and Renewal Call-A-AH

## 2024-04-01 NOTE — Telephone Encounter (Signed)
 I'm unable to send rx electronically; only print. I will send to PCP to refill.

## 2024-04-01 NOTE — Addendum Note (Signed)
 Addended by: CAMMIE GAETANA DEL on: 04/01/2024 02:02 PM   Modules accepted: Orders
